# Patient Record
Sex: Female | Born: 1935
Health system: Southern US, Community
[De-identification: ages and names within clinical notes are randomized; demographics above are authoritative.]

## PROBLEM LIST (undated history)

## (undated) DIAGNOSIS — I10 Essential (primary) hypertension: Secondary | ICD-10-CM

## (undated) DIAGNOSIS — F32A Depression, unspecified: Secondary | ICD-10-CM

## (undated) DIAGNOSIS — F329 Major depressive disorder, single episode, unspecified: Secondary | ICD-10-CM

## (undated) DIAGNOSIS — F419 Anxiety disorder, unspecified: Secondary | ICD-10-CM

## (undated) DIAGNOSIS — K5792 Diverticulitis of intestine, part unspecified, without perforation or abscess without bleeding: Secondary | ICD-10-CM

## (undated) DIAGNOSIS — M1990 Unspecified osteoarthritis, unspecified site: Secondary | ICD-10-CM

## (undated) DIAGNOSIS — C801 Malignant (primary) neoplasm, unspecified: Secondary | ICD-10-CM

## (undated) DIAGNOSIS — C4491 Basal cell carcinoma of skin, unspecified: Secondary | ICD-10-CM

## (undated) HISTORY — PX: EYE SURGERY: SHX253

## (undated) HISTORY — PX: ABDOMINAL HYSTERECTOMY: SHX81

## (undated) HISTORY — PX: OTHER SURGICAL HISTORY: SHX169

## (undated) HISTORY — PX: WISDOM TOOTH EXTRACTION: SHX21

## (undated) HISTORY — PX: APPENDECTOMY: SHX54

## (undated) HISTORY — PX: ABDOMINAL SURGERY: SHX537

## (undated) HISTORY — PX: DILATION AND CURETTAGE OF UTERUS: SHX78

---
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
## 2004-01-12 HISTORY — PX: OTHER SURGICAL HISTORY: SHX169

## 2009-11-17 ENCOUNTER — Encounter: Payer: Self-pay | Admitting: Family Medicine

## 2010-01-15 ENCOUNTER — Ambulatory Visit
Admission: RE | Admit: 2010-01-15 | Discharge: 2010-01-15 | Payer: Self-pay | Source: Home / Self Care | Attending: Family Medicine | Admitting: Family Medicine

## 2010-01-15 ENCOUNTER — Encounter: Payer: Self-pay | Admitting: Family Medicine

## 2010-01-15 ENCOUNTER — Other Ambulatory Visit: Payer: Self-pay | Admitting: Family Medicine

## 2010-01-15 DIAGNOSIS — Z8719 Personal history of other diseases of the digestive system: Secondary | ICD-10-CM | POA: Insufficient documentation

## 2010-01-15 DIAGNOSIS — M129 Arthropathy, unspecified: Secondary | ICD-10-CM | POA: Insufficient documentation

## 2010-01-15 DIAGNOSIS — I10 Essential (primary) hypertension: Secondary | ICD-10-CM | POA: Insufficient documentation

## 2010-01-15 LAB — CBC WITH DIFFERENTIAL/PLATELET
Basophils Absolute: 0 10*3/uL (ref 0.0–0.1)
Basophils Relative: 0.4 % (ref 0.0–3.0)
Eosinophils Absolute: 0 10*3/uL (ref 0.0–0.7)
Eosinophils Relative: 0.5 % (ref 0.0–5.0)
HCT: 41.4 % (ref 36.0–46.0)
Hemoglobin: 14 g/dL (ref 12.0–15.0)
Lymphocytes Relative: 15.8 % (ref 12.0–46.0)
Lymphs Abs: 1.5 10*3/uL (ref 0.7–4.0)
MCHC: 33.9 g/dL (ref 30.0–36.0)
MCV: 92.6 fl (ref 78.0–100.0)
Monocytes Absolute: 0.8 10*3/uL (ref 0.1–1.0)
Monocytes Relative: 8.2 % (ref 3.0–12.0)
Neutro Abs: 7 10*3/uL (ref 1.4–7.7)
Neutrophils Relative %: 75.1 % (ref 43.0–77.0)
Platelets: 209 10*3/uL (ref 150.0–400.0)
RBC: 4.47 Mil/uL (ref 3.87–5.11)
RDW: 13.9 % (ref 11.5–14.6)
WBC: 9.3 10*3/uL (ref 4.5–10.5)

## 2010-01-16 LAB — BASIC METABOLIC PANEL
BUN: 12 mg/dL (ref 6–23)
CO2: 30 mEq/L (ref 19–32)
Calcium: 9.3 mg/dL (ref 8.4–10.5)
Chloride: 98 mEq/L (ref 96–112)
Creatinine, Ser: 0.8 mg/dL (ref 0.4–1.2)
GFR: 68.24 mL/min (ref >60.00)
Glucose, Bld: 94 mg/dL (ref 70–99)
Potassium: 4.9 mEq/L (ref 3.5–5.1)
Sodium: 135 mEq/L (ref 135–145)

## 2010-01-16 LAB — HEPATIC FUNCTION PANEL
ALT: 15 U/L (ref 0–35)
AST: 20 U/L (ref 0–37)
Albumin: 3.9 g/dL (ref 3.5–5.2)
Alkaline Phosphatase: 70 U/L (ref 39–117)
Bilirubin, Direct: 0.1 mg/dL (ref 0.0–0.3)
Total Bilirubin: 0.6 mg/dL (ref 0.3–1.2)
Total Protein: 6.7 g/dL (ref 6.0–8.3)

## 2010-01-16 LAB — TSH: TSH: 3.25 u[IU]/mL (ref 0.35–5.50)

## 2010-02-12 NOTE — Miscellaneous (Signed)
Summary: Health Care Power of [facility] Power of Attorney   Imported By: Maryln Gottron 01/20/2010 10:52:44  _____________________________________________________________________  External Attachment:    Type:   Image     Comment:   External Document

## 2010-02-12 NOTE — Miscellaneous (Signed)
Summary: Living Will  Living Will   Imported By: Maryln Gottron 01/20/2010 10:49:16  _____________________________________________________________________  External Attachment:    Type:   Image     Comment:   External Document

## 2010-02-12 NOTE — Assessment & Plan Note (Signed)
Summary: PT EST APPT (MEDICARE PT) - OK PER DR TODD // RS   Vital Signs:  Patient profile:   77 year old female Height:      63 inches Weight:      150 pounds BMI:     26.67 Temp:     97.5 degrees F oral BP sitting:   140 / 90  (left arm) Cuff size:   regular  Vitals Entered By: Kern Reap CMA Duncan Dull) (January 15, 2010 9:36 AM) CC: new to establish care Is Patient Diabetic? No Pain Assessment Patient in pain? no        CC:  new to establish care.  History of Present Illness: [name] is a delightful, 77 year old recently widowed......... her husband died in 09/23/11after a 3-year stay in a nursing home with dementia......... who comes in today accompanied by her daughter, who she currently lives with for general medical examination as a new patient.  Her past medical history, social history, family history were all reviewed in detail.  Allergies ACE inhibitor gives her a cough.  No hives.  Current medications Celexa 20 mg nightly also, Ambien 5 mg nightly.  She's been on the Ambien, now for 3 years.  We discussed coming off the Ambien.  Eight atenolol 50 mg daily, along with hydrochlorothiazide 25 mg daily for hypertension.  BP 140/90.  Prilosec 20 p.r.n.  She is having trouble with constipation.  History diverticuli also a polyp.  Colonoscopy in 06, normal.  Also, some osteoarthritis of her hands.  Last mammogram was 06.  Advise she does not need further mammography nor colonoscopy screening tests.  Tetanus 2008, Pneumovax 2008, seasonal flu 2011, shingles 2008.  She also states she takes a baby aspirin daily, along with one tablespoon of Metamucil because of the diverticulosis  Preventive Screening-Counseling & Management  Alcohol-Tobacco     Smoking Status: never  Hep-HIV-STD-Contraception     Dental Visit-last 6 months yes      Drug Use:  no.    Allergies (verified): No Known Drug Allergies  Past History:  Past medical, surgical, family and social  histories (including risk factors) reviewed, and no changes noted (except as noted below).  Past Medical History: Diverticulitis, hx of Hypertension  Past Surgical History: uterin tumor  Family History: Reviewed history and no changes required. Father:  Mother: DM II Siblings: 3 brothers - deceased, 4 sisters - 2 deceased  Social History: Reviewed history and no changes required. Retired Conservation officer, [employer] Never Smoked Alcohol use-no Drug use-no Smoking Status:  never Drug Use:  no Dental Care w/in 6 mos.:  yes  Review of Systems      See HPI  Physical Exam  General:  Well-developed,well-nourished,in no acute distress; alert,appropriate and cooperative throughout examination Head:  Normocephalic and atraumatic without obvious abnormalities. No apparent alopecia or balding. Eyes:  No corneal or conjunctival inflammation noted. EOMI. Perrla. Funduscopic exam benign, without hemorrhages, exudates or papilledema. Vision grossly normal.....bilateral cataract and lens implants.  Last year Ears:  External ear exam shows no significant lesions or deformities.  Otoscopic examination reveals clear canals, tympanic membranes are intact bilaterally without bulging, retraction, inflammation or discharge. Hearing is grossly normal bilaterally. Nose:  External nasal examination shows no deformity or inflammation. Nasal mucosa are pink and moist without lesions or exudates. Mouth:  Oral mucosa and oropharynx without lesions or exudates.  Teeth in good repair. Neck:  No deformities, masses, or tenderness noted. Chest Wall:  No deformities, masses, or tenderness noted. Breasts:  No mass, nodules, thickening, tenderness, bulging, retraction, inflamation, nipple discharge or skin changes noted.   Lungs:  Normal respiratory effort, chest expands symmetrically. Lungs are clear to auscultation, no crackles or wheezes. Heart:  Normal rate and regular rhythm. S1 and S2 normal without gallop, murmur,  click, rub or other extra sounds. Abdomen:  Bowel sounds positive,abdomen soft and non-tender without masses, organomegaly or hernias noted. Rectal:  No external abnormalities noted. Normal sphincter tone. No rectal masses or tenderness. Msk:  No deformity or scoliosis noted of thoracic or lumbar spine.   Pulses:  R and L carotid,radial,femoral,dorsalis pedis and posterior tibial pulses are full and equal bilaterally Extremities:  No clubbing, cyanosis, edema, or deformity noted with normal full range of motion of all joints.   Neurologic:  No cranial nerve deficits noted. Station and gait are normal. Plantar reflexes are down-going bilaterally. DTRs are symmetrical throughout. Sensory, motor and coordinative functions appear intact. Skin:  total body skin exam normal.  She has a garden variety of seborrheic keratosis.  Skin tags, nothing that appears atypical Cervical Nodes:  No lymphadenopathy noted Axillary Nodes:  No palpable lymphadenopathy Inguinal Nodes:  No significant adenopathy Psych:  Cognition and judgment appear intact. Alert and cooperative with normal attention span and concentration. No apparent delusions, illusions, hallucinations   Problems:  Medical Problems Added: 1)  Dx of Arthritis  (ICD-716.90) 2)  Dx of Hypertension  (ICD-401.9) 3)  Dx of Diverticulitis, Hx of  (ICD-V12.79)  Impression & Recommendations:  Problem # 1:  HYPERTENSION (ICD-401.9) Assessment New  Her updated medication list for this problem includes:    Atenolol 50 Mg Tabs (Atenolol) .Marland Kitchen... Take one tab once daily    Hydrochlorothiazide 25 Mg Tabs (Hydrochlorothiazide) .Marland Kitchen... Take one tab by mouth once daily  Orders: Venipuncture (16109) TLB-BMP (Basic Metabolic Panel-BMET) (80048-METABOL) TLB-CBC Platelet - w/Differential (85025-CBCD) TLB-Hepatic/Liver Function Pnl (80076-HEPATIC) TLB-TSH (Thyroid Stimulating Hormone) (84443-TSH) EKG w/ Interpretation (93000)  Problem # 2:  DIVERTICULITIS, HX  OF (ICD-V12.79) Assessment: New  Orders: Venipuncture (60454) TLB-BMP (Basic Metabolic Panel-BMET) (80048-METABOL) TLB-CBC Platelet - w/Differential (85025-CBCD) TLB-Hepatic/Liver Function Pnl (80076-HEPATIC) TLB-TSH (Thyroid Stimulating Hormone) (84443-TSH)  Problem # 3:  ARTHRITIS (ICD-716.90) Assessment: New  Orders: Venipuncture (09811) TLB-BMP (Basic Metabolic Panel-BMET) (80048-METABOL) TLB-CBC Platelet - w/Differential (85025-CBCD) TLB-Hepatic/Liver Function Pnl (80076-HEPATIC) TLB-TSH (Thyroid Stimulating Hormone) (84443-TSH) EKG w/ Interpretation (93000)  Complete Medication List: 1)  Citalopram Hydrobromide 20 Mg Tabs (Citalopram hydrobromide) .Marland Kitchen.. 1 & 1/2 at bedtime 2)  Atenolol 50 Mg Tabs (Atenolol) .... Take one tab once daily 3)  Zolpidem Tartrate 5 Mg Tabs (Zolpidem tartrate) .... Take one tab by mouth at bedtime 4)  Hydrochlorothiazide 25 Mg Tabs (Hydrochlorothiazide) .... Take one tab by mouth once daily 5)  Omeprazole 20 Mg Cpdr (Omeprazole) .... Take one tab once daily as needed 6)  Ativan 0.5 Mg Tabs (Lorazepam) .Marland Kitchen.. 1 tab @ bedtime  Other Orders: Urinalysis-dipstick only (Medicare patient) (91478GN)  Patient Instructions: 1)  begin a walking program 30 minutes daily. 2)  Return annually for general medical checkup. 3)  Increase the Metamucil to two tablespoons daily if in two weeks if bowel movements are soft and loose and regular, then continue two tablespoons.  If, however, there are not increased the dose to 3 tablespoons.  Consider increasing the Celexa and stopping the ambient. 4)  Dr. Alvester Morin is an excellent dentist. 5)  Dr. Vonna Kotyk is a 6)  ophthalmologist Prescriptions: HYDROCHLOROTHIAZIDE 25 MG TABS (HYDROCHLOROTHIAZIDE) take one tab by mouth once daily  #100  x 3   Entered and Authorized by:   Roderick Pee MD   Signed by:   Roderick Pee MD on 01/15/2010   Method used:   Print then Give to Patient   RxID:   [id number] ATENOLOL 50 MG TABS  (ATENOLOL) take one tab once daily  #100 x 3   Entered and Authorized by:   Roderick Pee MD   Signed by:   Roderick Pee MD on 01/15/2010   Method used:   Print then Give to Patient   RxID:   [id number] ATIVAN 0.5 MG TABS (LORAZEPAM) 1 tab @ bedtime  #50 x 3   Entered and Authorized by:   Roderick Pee MD   Signed by:   Roderick Pee MD on 01/15/2010   Method used:   Print then Give to Patient   RxID:   [id number] CITALOPRAM HYDROBROMIDE 20 MG TABS (CITALOPRAM HYDROBROMIDE) 1 & 1/2 at bedtime  #150 x 3   Entered and Authorized by:   Roderick Pee MD   Signed by:   Roderick Pee MD on 01/15/2010   Method used:   Print then Give to Patient   RxID:   [id number]    Orders Added: 1)  New Patient Level IV [42595] 2)  Venipuncture [63875] 3)  TLB-BMP (Basic Metabolic Panel-BMET) [80048-METABOL] 4)  TLB-CBC Platelet - w/Differential [85025-CBCD] 5)  TLB-Hepatic/Liver Function Pnl [80076-HEPATIC] 6)  TLB-TSH (Thyroid Stimulating Hormone) [84443-TSH] 7)  Urinalysis-dipstick only (Medicare patient) [81003QW] 8)  EKG w/ Interpretation [93000]   Immunization History:  Tetanus/Td Immunization History:    Tetanus/Td:  historical (01/11/2006)  Influenza Immunization History:    Influenza:  historical (10/11/2009)  Pneumovax Immunization History:    Pneumovax:  historical (01/11/2006)  Zostavax History:    Zostavax # 1:  zostavax (01/11/2006)   Immunization History:  Tetanus/Td Immunization History:    Tetanus/Td:  Historical (01/11/2006)  Influenza Immunization History:    Influenza:  Historical (10/11/2009)  Pneumovax Immunization History:    Pneumovax:  Historical (01/11/2006)  Zostavax History:    Zostavax # 1:  Zostavax (01/11/2006)   Appended Document: PT EST APPT (MEDICARE PT) - OK PER DR TODD // RS  Laboratory Results   Urine Tests    Routine Urinalysis   Color: yellow Appearance: Clear Glucose: negative   (Normal Range:  Negative) Bilirubin: negative   (Normal Range: Negative) Ketone: negative   (Normal Range: Negative) Spec. Gravity: 1.015   (Normal Range: 1.003-1.035) Blood: trace-lysed   (Normal Range: Negative) pH: 7.5   (Normal Range: 5.0-8.0) Protein: negative   (Normal Range: Negative) Urobilinogen: 0.2   (Normal Range: 0-1) Nitrite: negative   (Normal Range: Negative) Leukocyte Esterace: 2+   (Normal Range: Negative)    Comments: Rita Ohara  January 15, 2010 3:51 PM

## 2010-04-13 ENCOUNTER — Other Ambulatory Visit: Payer: Self-pay | Admitting: *Deleted

## 2010-04-13 MED ORDER — HYDROCHLOROTHIAZIDE 25 MG PO TABS: 25.0000 mg | ORAL_TABLET | Freq: Every day | ORAL | Status: DC

## 2010-04-13 MED ORDER — CITALOPRAM HYDROBROMIDE 20 MG PO TABS: ORAL_TABLET | ORAL | Status: DC

## 2010-04-13 MED ORDER — ATENOLOL 50 MG PO TABS: 50.0000 mg | ORAL_TABLET | Freq: Every day | ORAL | Status: DC

## 2010-04-13 MED ORDER — LORAZEPAM 0.5 MG PO TABS: 0.5000 mg | ORAL_TABLET | Freq: Three times a day (TID) | ORAL | Status: AC

## 2010-05-13 ENCOUNTER — Ambulatory Visit: Payer: Self-pay | Admitting: Family Medicine

## 2010-09-08 ENCOUNTER — Other Ambulatory Visit: Payer: Self-pay | Admitting: *Deleted

## 2010-09-08 MED ORDER — LORAZEPAM 0.5 MG PO TABS: 0.5000 mg | ORAL_TABLET | Freq: Three times a day (TID) | ORAL | Status: AC

## 2011-04-07 ENCOUNTER — Ambulatory Visit (INDEPENDENT_AMBULATORY_CARE_PROVIDER_SITE_OTHER)
Admission: RE | Admit: 2011-04-07 | Discharge: 2011-04-07 | Disposition: A | Discharge status: Home / Self Care | Payer: Medicare Other | Source: Ambulatory Visit | Attending: Internal Medicine | Admitting: Internal Medicine

## 2011-04-07 ENCOUNTER — Other Ambulatory Visit: Payer: Self-pay | Admitting: Cardiology

## 2011-04-07 ENCOUNTER — Telehealth: Payer: Self-pay | Admitting: *Deleted

## 2011-04-07 ENCOUNTER — Ambulatory Visit (INDEPENDENT_AMBULATORY_CARE_PROVIDER_SITE_OTHER): Payer: Medicare Other | Admitting: Internal Medicine

## 2011-04-07 ENCOUNTER — Encounter (INDEPENDENT_AMBULATORY_CARE_PROVIDER_SITE_OTHER): Payer: Medicare Other

## 2011-04-07 ENCOUNTER — Encounter: Payer: Self-pay | Admitting: Internal Medicine

## 2011-04-07 VITALS — BP 132/94 | HR 72 | Temp 97.7°F | Wt 168.0 lb

## 2011-04-07 DIAGNOSIS — M79609 Pain in unspecified limb: Secondary | ICD-10-CM | POA: Diagnosis not present

## 2011-04-07 DIAGNOSIS — M79669 Pain in unspecified lower leg: Secondary | ICD-10-CM | POA: Insufficient documentation

## 2011-04-07 DIAGNOSIS — R229 Localized swelling, mass and lump, unspecified: Secondary | ICD-10-CM

## 2011-04-07 DIAGNOSIS — M25562 Pain in left knee: Secondary | ICD-10-CM

## 2011-04-07 DIAGNOSIS — M25569 Pain in unspecified knee: Secondary | ICD-10-CM

## 2011-04-07 DIAGNOSIS — M25869 Other specified joint disorders, unspecified knee: Secondary | ICD-10-CM | POA: Diagnosis not present

## 2011-04-07 MED ORDER — TRAMADOL HCL 50 MG PO TABS: 25.0000 mg | ORAL_TABLET | Freq: Three times a day (TID) | ORAL | Status: AC | PRN

## 2011-04-07 NOTE — Assessment & Plan Note (Signed)
Question rupture of bakers cyst.  Rule out DVT.  Obtain venous ultrasound.

## 2011-04-07 NOTE — Patient Instructions (Addendum)
Our office will contact you re: orthopedic referral Use extra strength tylenol as directed

## 2011-04-07 NOTE — Assessment & Plan Note (Signed)
78 year old with intermittent left knee pain. She has medial joint line tenderness. Question osteoarthritis versus meniscal injury. Obtain x-ray of left knee and refer to orthopedic specialist for further evaluation and treatment.

## 2011-04-07 NOTE — Telephone Encounter (Signed)
L knee pain when puts weight on it, and some calf pain x one-two weeks.  No injury.  Would like to be worked in with Dr. Tawanna Cooler.  Minimal amount of swelling in calf.

## 2011-04-07 NOTE — Progress Notes (Signed)
  Subjective:    Patient ID: [name], female    DOB: 19-May-1921, 78 y.o.   MRN: [id number]  HPI  78 year old white female complains of left knee pain for one month. Her symptoms started after she flexed her knee while in bed to get underneath her covers. She initially felt a sharp pain in the back of her knee. Her pain progressed where she has knee pain along medial joint line and also near her kneecap. She notes mild left ankle swelling. There has been mild improvement with taking over-the-counter NSAIDs. Patient reports a sharp stabbing sensation when she first stands after prolonged sitting.   Posterior upper calf is tender.   Review of Systems Negative for leg redness, negative for chest pain or shortness of breath  No past medical history on file.  History   Social History  . Marital Status: Widowed    Spouse Name: N/A    Number of Children: N/A  . Years of Education: N/A   Occupational History  . Not on file.   Social History Main Topics  . Smoking status: Never Smoker   . Smokeless tobacco: Not on file  . Alcohol Use: No  . Drug Use: No  . Sexually Active: Not on file   Other Topics Concern  . Not on file   Social History Narrative   She is from [place].  Moved to [place] to be nearer to daughter [name]    No past surgical history on file.  No family history on file.  Allergies not on file  Current Outpatient Prescriptions on File Prior to Visit  Medication Sig Dispense Refill  . atenolol (TENORMIN) 50 MG tablet Take 1 tablet (50 mg total) by mouth daily.  90 tablet  3  . citalopram (CELEXA) 20 MG tablet One and half tab daily  150 tablet  3  . hydrochlorothiazide 25 MG tablet Take 1 tablet (25 mg total) by mouth daily.  90 tablet  3    BP 132/94  Pulse 72  Temp(Src) 97.7 F (36.5 C) (Oral)  Wt 168 lb (76.204 kg)       Objective:   Physical Exam  Constitutional: She appears well-developed and well-nourished.  Cardiovascular: Normal  rate, regular rhythm and normal heart sounds.   Pulmonary/Chest: Effort normal and breath sounds normal. She has no wheezes. She has no rales.  Musculoskeletal:       No obvious joint effusion. Left medial joint line tenderness,  Upper calf area tender to touch.  No redness Knee joint is stable  Skin: Skin is warm and dry.       Assessment & Plan:

## 2011-04-07 NOTE — Telephone Encounter (Signed)
Scheduled with Dr. Artist Pais today.

## 2011-04-20 DIAGNOSIS — N950 Postmenopausal bleeding: Secondary | ICD-10-CM | POA: Diagnosis not present

## 2011-04-23 ENCOUNTER — Encounter (HOSPITAL_COMMUNITY): Payer: Self-pay | Admitting: Pharmacist

## 2011-04-23 ENCOUNTER — Other Ambulatory Visit: Payer: Self-pay | Admitting: Obstetrics and Gynecology

## 2011-04-26 ENCOUNTER — Other Ambulatory Visit: Payer: Self-pay

## 2011-04-26 ENCOUNTER — Encounter (HOSPITAL_COMMUNITY): Payer: Self-pay

## 2011-04-26 ENCOUNTER — Encounter (HOSPITAL_COMMUNITY)
Admission: RE | Admit: 2011-04-26 | Discharge: 2011-04-26 | Disposition: A | Discharge status: Home / Self Care | Payer: Medicare Other | Source: Ambulatory Visit | Attending: Obstetrics and Gynecology | Admitting: Obstetrics and Gynecology

## 2011-04-26 DIAGNOSIS — Z01812 Encounter for preprocedural laboratory examination: Secondary | ICD-10-CM | POA: Diagnosis not present

## 2011-04-26 DIAGNOSIS — I10 Essential (primary) hypertension: Secondary | ICD-10-CM | POA: Diagnosis not present

## 2011-04-26 DIAGNOSIS — N950 Postmenopausal bleeding: Secondary | ICD-10-CM | POA: Diagnosis not present

## 2011-04-26 DIAGNOSIS — Z01818 Encounter for other preprocedural examination: Secondary | ICD-10-CM | POA: Diagnosis not present

## 2011-04-26 HISTORY — DX: Major depressive disorder, single episode, unspecified: F32.9

## 2011-04-26 HISTORY — DX: Anxiety disorder, unspecified: F41.9

## 2011-04-26 HISTORY — DX: Unspecified osteoarthritis, unspecified site: M19.90

## 2011-04-26 HISTORY — DX: Diverticulitis of intestine, part unspecified, without perforation or abscess without bleeding: K57.92

## 2011-04-26 HISTORY — DX: Essential (primary) hypertension: I10

## 2011-04-26 HISTORY — DX: Depression, unspecified: F32.A

## 2011-04-26 LAB — BASIC METABOLIC PANEL
BUN: 12 mg/dL (ref 6–23)
CO2: 25 mEq/L (ref 19–32)
Calcium: 9.3 mg/dL (ref 8.4–10.5)
Creatinine, Ser: 0.71 mg/dL (ref 0.50–1.10)
Glucose, Bld: 91 mg/dL (ref 70–99)

## 2011-04-26 LAB — CBC
MCH: 29.2 pg (ref 26.0–34.0)
MCHC: 32.2 g/dL (ref 30.0–36.0)
MCV: 90.7 fL (ref 78.0–100.0)
Platelets: 242 10*3/uL (ref 150–400)
RDW: 14.1 % (ref 11.5–15.5)

## 2011-04-26 NOTE — Patient Instructions (Addendum)
   Your procedure is scheduled on: Wednesday, April 17th   Enter through the [employer] of [facility] at: 7:00am Pick up the phone at the desk and dial [phone] and inform us of your arrival.  Please call this number if you have any problems the morning of surgery: [phone]  Remember: Do not eat food after midnight: Tuesday Do not drink clear liquids after: Tuesday Take these medicines the morning of surgery with a SIP OF WATER: atenolol, HCTZ  Do not wear jewelry, make-up, or FINGER nail polish Do not wear lotions, powders, perfumes or deodorant. Do not shave 48 hours prior to surgery. Do not bring valuables to the hospital. Contacts, dentures or bridgework may not be worn into surgery.  Patients discharged on the day of surgery will not be allowed to drive home.  Home with daughter Lavell Anchors  cell [phone]   Remember to use your hibiclens as instructed.Please shower with 1/2 bottle the evening before your surgery and the other 1/2 bottle the morning of surgery. Neck down avoiding private area.

## 2011-04-27 DIAGNOSIS — M25569 Pain in unspecified knee: Secondary | ICD-10-CM | POA: Diagnosis not present

## 2011-04-27 NOTE — H&P (Signed)
[name] is an 78 y.o. female. Who has developed post menopausal bleeding initially evaluated in May 2012 with an endometrial biopsy which revealed atrophic endometrium. The pathology report suggested an infarcted polyp but otherwise unremarkable. A prior pap smear however also noted atypical endometrial cells. She now has had renewed bleeding and is being taken to the operating room to undergo D&C and hysteroscopy to identify the source of bleeding and to rule out endometrial neoplasia   Pertinent Gynecological History:  Previous GYN Procedures: endometrial biopsy and surgery for fibroid tumor Last pap: normal Date: 04/20/2011  OB History: G1, P1    Past Medical History  Diagnosis Date  . Hypertension   . Diverticulitis   . Depression   . Arthritis     Hands/knees/hip/lower back - no meds  . Anxiety     Past Surgical History  Procedure Date  . Colonscopy 2006  . Wisdom tooth extraction   . Svd     x 1  . Eye surgery     Bilateral cataract eye surgery  . Abdominal surgery     removed fibroid and one fallopian tube removed  . Appendectomy   . Dilation and curettage of uterus     No family history on file.  Social History:  reports that she has never smoked. She has never used smokeless tobacco. She reports that she does not drink alcohol or use illicit drugs.  Allergies:  Allergies  Allergen Reactions  . Ace Inhibitors Cough    No prescriptions prior to admission    ROS  Respiratory: denies cough or SOB GI: no nausea, vomiting or diarrhea, no melena GU: no dysuria, frequency or urgency Gyn: positve bleeding but she denies pain or discharge   There were no vitals taken for this visit. Physical Exam  Afebrile      BP 143/83   Pulse 60   Respirations 16 Head: normocephalic and atraumatic Eyes: PERRLA Neck: supple no JVD no enlargement of thyroid Chest: clear to P&A Heart Regular rhythm Abdomen: soft without enlargement of liver kidneys or spleen Pelvic  exam:  External genitalia: NL                        BUS: WNL                         Vagina without gross lesion with normal discharge and positve for blood                        Cervix: blood per os bt without lesion                          Uterus: anterior normal size non tender                         Adnexa: without mass non tender Neurologic: grossly intact  No results found for this or any previous visit (from the past 24 hour(s)).  No results found.  Assessment/Plan:  Post menopausal bleeding etiology unknown   Plan: D&C and hysteroscopy Faye Sanfilippo 04/27/2011, 6:03 PM

## 2011-04-28 ENCOUNTER — Encounter (HOSPITAL_COMMUNITY): Payer: Self-pay | Admitting: Anesthesiology

## 2011-04-28 ENCOUNTER — Encounter (HOSPITAL_COMMUNITY)
Admission: RE | Disposition: A | Discharge status: Home / Self Care | Payer: Self-pay | Source: Ambulatory Visit | Attending: Obstetrics and Gynecology

## 2011-04-28 ENCOUNTER — Ambulatory Visit (HOSPITAL_COMMUNITY)
Admission: RE | Admit: 2011-04-28 | Discharge: 2011-04-28 | Disposition: A | Discharge status: Home / Self Care | Payer: Medicare Other | Source: Ambulatory Visit | Attending: Obstetrics and Gynecology | Admitting: Obstetrics and Gynecology

## 2011-04-28 ENCOUNTER — Ambulatory Visit (HOSPITAL_COMMUNITY): Payer: Medicare Other | Admitting: Anesthesiology

## 2011-04-28 DIAGNOSIS — Z01812 Encounter for preprocedural laboratory examination: Secondary | ICD-10-CM | POA: Insufficient documentation

## 2011-04-28 DIAGNOSIS — I10 Essential (primary) hypertension: Secondary | ICD-10-CM | POA: Diagnosis not present

## 2011-04-28 DIAGNOSIS — Z01818 Encounter for other preprocedural examination: Secondary | ICD-10-CM | POA: Diagnosis not present

## 2011-04-28 DIAGNOSIS — N950 Postmenopausal bleeding: Secondary | ICD-10-CM | POA: Insufficient documentation

## 2011-04-28 DIAGNOSIS — C549 Malignant neoplasm of corpus uteri, unspecified: Secondary | ICD-10-CM | POA: Diagnosis not present

## 2011-04-28 HISTORY — PX: HYSTEROSCOPY WITH D & C: SHX1775

## 2011-04-28 SURGERY — DILATATION AND CURETTAGE /HYSTEROSCOPY
Anesthesia: Monitor Anesthesia Care | Site: Vagina | Wound class: Clean Contaminated

## 2011-04-28 MED ORDER — MIDAZOLAM HCL 2 MG/2ML IJ SOLN
INTRAMUSCULAR | Status: AC
  Filled 2011-04-28: qty 2

## 2011-04-28 MED ORDER — HYDROMORPHONE HCL PF 1 MG/ML IJ SOLN: 0.2500 mg | INTRAMUSCULAR | Status: DC | PRN

## 2011-04-28 MED ORDER — LIDOCAINE HCL (CARDIAC) 20 MG/ML IV SOLN
INTRAVENOUS | Status: DC | PRN
  Administered 2011-04-28: 60 mg via INTRAVENOUS

## 2011-04-28 MED ORDER — PROPOFOL 10 MG/ML IV EMUL
INTRAVENOUS | Status: DC | PRN
  Administered 2011-04-28: 200 ug/kg/min via INTRAVENOUS
  Administered 2011-04-28: 09:00:00 via INTRAVENOUS

## 2011-04-28 MED ORDER — FENTANYL CITRATE 0.05 MG/ML IJ SOLN
INTRAMUSCULAR | Status: AC
  Filled 2011-04-28: qty 2

## 2011-04-28 MED ORDER — GLYCINE 1.5 % IR SOLN
Status: DC | PRN
  Administered 2011-04-28: 3000 mL

## 2011-04-28 MED ORDER — PROMETHAZINE HCL 25 MG/ML IJ SOLN: 6.2500 mg | INTRAMUSCULAR | Status: DC | PRN

## 2011-04-28 MED ORDER — MIDAZOLAM HCL 5 MG/5ML IJ SOLN
INTRAMUSCULAR | Status: DC | PRN
  Administered 2011-04-28: 2 mg via INTRAVENOUS

## 2011-04-28 MED ORDER — LACTATED RINGERS IV SOLN
INTRAVENOUS | Status: DC
  Administered 2011-04-28 (×2): via INTRAVENOUS

## 2011-04-28 MED ORDER — LIDOCAINE HCL 1 % IJ SOLN
INTRAMUSCULAR | Status: DC | PRN
  Administered 2011-04-28: 10 mL

## 2011-04-28 MED ORDER — KETOROLAC TROMETHAMINE 30 MG/ML IJ SOLN: 15.0000 mg | Freq: Once | INTRAMUSCULAR | Status: DC | PRN

## 2011-04-28 MED ORDER — MEPERIDINE HCL 25 MG/ML IJ SOLN: 6.2500 mg | INTRAMUSCULAR | Status: DC | PRN

## 2011-04-28 SURGICAL SUPPLY — 14 items
ABLATOR ENDOMETRIAL BIPOLAR (ABLATOR) IMPLANT
CANISTER SUCTION 2500CC (MISCELLANEOUS) ×2 IMPLANT
CATH ROBINSON RED A/P 16FR (CATHETERS) ×2 IMPLANT
CLOTH BEACON ORANGE TIMEOUT ST (SAFETY) ×2 IMPLANT
CONTAINER PREFILL 10% NBF 60ML (FORM) ×4 IMPLANT
ELECT REM PT RETURN 9FT ADLT (ELECTROSURGICAL)
ELECTRODE REM PT RTRN 9FT ADLT (ELECTROSURGICAL) IMPLANT
GLOVE BIO SURGEON STRL SZ7.5 (GLOVE) ×4 IMPLANT
GOWN PREVENTION PLUS LG XLONG (DISPOSABLE) ×2 IMPLANT
GOWN STRL REIN XL XLG (GOWN DISPOSABLE) ×2 IMPLANT
LOOP ANGLED CUTTING 22FR (CUTTING LOOP) IMPLANT
PACK HYSTEROSCOPY LF (CUSTOM PROCEDURE TRAY) ×2 IMPLANT
TOWEL OR 17X24 6PK STRL BLUE (TOWEL DISPOSABLE) ×4 IMPLANT
WATER STERILE IRR 1000ML POUR (IV SOLUTION) ×2 IMPLANT

## 2011-04-28 NOTE — Anesthesia Procedure Notes (Signed)
Procedure Name: MAC Date/Time: 04/28/2011 8:30 AM Performed by: Shamel Germond, Jannet Askew Pre-anesthesia Checklist: Patient identified, Patient being monitored, Emergency Drugs available, Timeout performed and Suction available Preoxygenation: Pre-oxygenation with 100% oxygen

## 2011-04-28 NOTE — Brief Op Note (Signed)
04/28/2011  8:45 AM  PATIENT:  Sundra Aland  78 y.o. female  PRE-OPERATIVE DIAGNOSIS:  POSTMENOPAUSAL BLEEDING  POST-OPERATIVE DIAGNOSIS:  postmenopausal bleeding  PROCEDURE:  Procedure(s) (LRB): DILATATION AND CURETTAGE /HYSTEROSCOPY (N/A)  SURGEON:  Surgeon(s) and Role:    * Miguel Aschoff, MD - Primary   ANESTHESIA:   IV sedation  EBL:  Total I/O In: 500 [I.V.:500] Out: -   BLOOD ADMINISTERED:none  DRAINS: none   LOCAL MEDICATIONS USED:  LIDOCAINE   SPECIMEN:  Source of Specimen:  Endometrial currettings  DISPOSITION OF SPECIMEN:  PATHOLOGY  COUNTS:  YES  TOURNIQUET:  * No tourniquets in log *  DICTATION: .Other Dictation: Dictation Number [id number]  PLAN OF CARE: Discharge to home after PACU  PATIENT DISPOSITION:  PACU - hemodynamically stable.   Delay start of Pharmacological VTE agent (>24hrs) due to surgical blood loss or risk of bleeding: not applicable

## 2011-04-28 NOTE — Op Note (Signed)
NAMEKRYSTALL, [name] NO.:  [id number]  MEDICAL RECORD NO.:  [id number]  LOCATION:  [facility]                          FACILITY:  [facility]  PHYSICIAN:  Miguel Aschoff, M.D.       DATE OF BIRTH:  01/08/1922  DATE OF PROCEDURE:  04/28/2011 DATE OF DISCHARGE:                              OPERATIVE REPORT   PREOPERATIVE DIAGNOSIS:  Postmenopausal bleeding.  POSTOPERATIVE DIAGNOSIS:  Postmenopausal bleeding, rule out endometrial neoplasia.  SURGEON:  Miguel Aschoff, MD  ANESTHESIA:  IV sedation with paracervical block.  COMPLICATIONS:  None.  JUSTIFICATION:  The patient is an 78 year old white female, who developed vaginal bleeding and was initially assessed in May 2012, at which time she had an endometrial biopsy revealing atrophic-appearing endometrium.  The patient initially did okay, however, presented in April 2013 with renewed bleeding.  In view of the degree of bleeding, it was felt that a thorough examination of the endometrial cavity needed to be carried out to rule out endometrial neoplasia.  The risks and benefits of the procedure were discussed with the patient and her daughter.  Informed consent has been obtained for hysteroscopy, and D and C.  The risks and benefits of the procedure were discussed with the patient and her daughter.  Informed consent has been obtained.  DESCRIPTION OF PROCEDURE:  The patient was taken to the operating room, placed in a supine position.  General and IV sedation was administered without difficulty.  She was then placed in a dorsal lithotomy position, prepped and draped in a usual sterile fashion.  Foley catheter was placed and bladder drained.  Examination under anesthesia revealed normal external genitalia.  Normal Bartholin and Skene glands.  Normal urethra.  The vaginal vault was without gross lesion except for atrophic vaginal changes.  The cervix revealed no obvious lesions.  There was blood coming through the cervical os.   Uterus was noted to be anterior, globular, top-normal size.  No adnexal masses were noted.  At this point, speculum was placed in the vaginal vault.  The anterior cervical lip was grasped with a tenaculum and then the cervix was dilated until a #25 Pratt dilator could be passed.  Then, the diagnostic hysteroscope was advanced through the endocervical canal.  No endocervical lesions were noted.  However, on entering the endometrial cavity, a large amount of tissue appeared to be present within the cavity way more than expected for an 78 year old female.  At this point, the hysteroscope was removed.  A sharp vigorous curettage was carried out yielding a large amount of tissue suspicious for an endometrial neoplasm.  This was sent for histologic study.  Prior to doing the curettage, 10 mL of 1% Xylocaine was placed in the cervix by placing equal amounts at 12 and 6 o'clock positions.  Following this, curettage was carried out without difficulty.  After the tissue was collected, it was sent for histologic study.  The patient had the instruments removed.  Hemostasis was readily achieved.  She was brought to the recovery in satisfactory condition. The patient tolerated the procedure well.  The estimated blood loss was approximately 30 mL.  The fluid deficit on hysteroscopy was 10 mL.  Plan is for the patient to be discharged home.  She is to call on Friday for her pathology report.  She is to call for any problems such as fever, pain, or heavy bleeding.     Miguel Aschoff, M.D.     AR/MEDQ  D:  04/28/2011  T:  04/28/2011  Job:  [id number]

## 2011-04-28 NOTE — Anesthesia Preprocedure Evaluation (Signed)
Anesthesia Evaluation  Patient identified by MRN, date of birth, ID band Patient awake    Reviewed: Allergy & Precautions, H&P , NPO status , Patient's Chart, lab work & pertinent test results  Airway Mallampati: II TM Distance: >3 FB Neck ROM: full    Dental  (+) Teeth Intact   Pulmonary neg pulmonary ROS,  breath sounds clear to auscultation  Pulmonary exam normal       Cardiovascular hypertension, Pt. on home beta blockers     Neuro/Psych Anxiety Depression negative neurological ROS  negative psych ROS   GI/Hepatic negative GI ROS, Neg liver ROS,   Endo/Other  negative endocrine ROS  Renal/GU negative Renal ROS  negative genitourinary   Musculoskeletal negative musculoskeletal ROS (+)   Abdominal Normal abdominal exam  (+)   Peds negative pediatric ROS (+)  Hematology negative hematology ROS (+)   Anesthesia Other Findings   Reproductive/Obstetrics negative OB ROS                           Anesthesia Physical Anesthesia Plan  ASA: II  Anesthesia Plan: MAC   Post-op Pain Management:    Induction: Intravenous  Airway Management Planned:   Additional Equipment:   Intra-op Plan:   Post-operative Plan:   Informed Consent: I have reviewed the patients History and Physical, chart, labs and discussed the procedure including the risks, benefits and alternatives for the proposed anesthesia with the patient or authorized representative who has indicated his/her understanding and acceptance.   Dental Advisory Given and History available from chart only  Plan Discussed with: CRNA and Surgeon  Anesthesia Plan Comments:         Anesthesia Quick Evaluation

## 2011-04-28 NOTE — Transfer of Care (Signed)
Immediate Anesthesia Transfer of Care Note  Patient: [name]  Procedure(s) Performed: Procedure(s) (LRB): DILATATION AND CURETTAGE /HYSTEROSCOPY (N/A)  Patient Location: PACU  Anesthesia Type: MAC  Level of Consciousness: sedated  Airway & Oxygen Therapy: Patient Spontanous Breathing and Patient connected to nasal cannula oxygen  Post-op Assessment: Report given to PACU RN and Post -op Vital signs reviewed and stable  Post vital signs: Reviewed and stable  Complications: No apparent anesthesia complications

## 2011-04-28 NOTE — Discharge Instructions (Signed)
DISCHARGE INSTRUCTIONS: D&C / D&E The following instructions have been prepared to help you care for yourself upon your return home.   Personal hygiene: Marland Kitchen Use sanitary pads for vaginal drainage, not tampons. . Shower the day after your procedure. . NO tub baths, pools or Jacuzzis for 2-3 weeks. . Wipe front to back after using the bathroom.  Activity and limitations: . Do NOT drive or operate any equipment for 24 hours. The effects of anesthesia are still present and drowsiness may result. . Do NOT rest in bed all day. . Walking is encouraged. . Walk up and down stairs slowly. . You may resume your normal activity in one to two days or as indicated by your physician.  Sexual activity: NO intercourse for at least 2 weeks after the procedure, or as indicated by your physician.  Diet: Eat a light meal as desired this evening. You may resume your usual diet tomorrow.  Return to work: You may resume your work activities in one to two days or as indicated by your doctor.  What to expect after your surgery: Expect to have vaginal bleeding/discharge for 2-3 days and spotting for up to 10 days. It is not unusual to have soreness for up to 1-2 weeks. You may have a slight burning sensation when you urinate for the first day. Mild cramps may continue for a couple of days. You may have a regular period in 2-6 weeks.  Call your doctor for any of the following: . Excessive vaginal bleeding, saturating and changing one pad every hour. . Inability to urinate 6 hours after discharge from hospital. . Pain not relieved by pain medication. . Fever of 100.4 F or greater. . Unusual vaginal discharge or odor.  Post Anesthesia Care Unit [phone]

## 2011-04-28 NOTE — Anesthesia Postprocedure Evaluation (Signed)
Anesthesia Post Note  Patient: [name]  Procedure(s) Performed: Procedure(s) (LRB): DILATATION AND CURETTAGE /HYSTEROSCOPY (N/A)  Anesthesia type: MAC  Patient location: PACU  Post pain: Pain level controlled  Post assessment: Post-op Vital signs reviewed  Last Vitals:  Filed Vitals:   04/28/11 0845  BP: 137/61  Pulse: 56  Temp: 36.4 C  Resp: 16    Post vital signs: Reviewed  Level of consciousness: sedated  Complications: No apparent anesthesia complications

## 2011-04-29 ENCOUNTER — Encounter (HOSPITAL_COMMUNITY): Payer: Self-pay | Admitting: Obstetrics and Gynecology

## 2011-05-12 ENCOUNTER — Encounter: Payer: Self-pay | Admitting: Gynecologic Oncology

## 2011-05-12 ENCOUNTER — Ambulatory Visit: Payer: Medicare Other | Attending: Gynecologic Oncology | Admitting: Gynecologic Oncology

## 2011-05-12 VITALS — BP 180/80 | HR 74 | Temp 98.0°F | Resp 14 | Ht 63.62 in | Wt 157.2 lb

## 2011-05-12 DIAGNOSIS — I10 Essential (primary) hypertension: Secondary | ICD-10-CM | POA: Insufficient documentation

## 2011-05-12 DIAGNOSIS — C549 Malignant neoplasm of corpus uteri, unspecified: Secondary | ICD-10-CM | POA: Diagnosis not present

## 2011-05-12 DIAGNOSIS — Z79899 Other long term (current) drug therapy: Secondary | ICD-10-CM | POA: Insufficient documentation

## 2011-05-12 DIAGNOSIS — C541 Malignant neoplasm of endometrium: Secondary | ICD-10-CM

## 2011-05-12 NOTE — Patient Instructions (Signed)
Return for pre-operative visit

## 2011-05-12 NOTE — Progress Notes (Signed)
Consult Note: Gyn-Onc  [name] 78 y.o. female  CC:  Chief Complaint  Patient presents with  . Endo ca    Follow up    HPI: The patient is seen today in consultation at the request of Dr. Tenny Hudson. Patient is a very pleasant 78 year old gravida 1 para 1 has been menopausal since her 71s to 14s. She took Premarin for a few years and in was prescribed progesterone had some bleeding therefore she stopped all hormone replacement therapy. This is about 20 years ago. She began having some bleeding in July of 2012 at which time she had an endometrial biopsy that revealed an atrophic endometrium. She did well until she presented in April of 2013 with any bleeding. In view of the degree of bleeding was felt that a thorough evaluation of the endometrial cavity needed to be performed. She therefore underwent a hysteroscopy D&C. Hysteroscopic findings included a large amount of tissue present within the cavity that was more than switch to be expected for an 78 year old. Pathology revealed a grade 2 endometrioid carcinoma. She comes in today for consultation regarding the above.  She is accompanied by her daughter today. She states she continues having a small amount of bleeding. She denies any abdominal or pelvic pain. She states that there is no significant change in her bowel or bladder habits. She does state that occasionally she feels about with bowel movements but has been going on for a few years. She occasionally also has some stool incontinence if she has some diarrhea. This will happen about once a week if she has diarrhea.  She had a colonoscopy 4 years ago that showed diverticular disease.  Interval History:   Review of Systems: She denies any chest pain, shortness of breath, nausea, fevers, chills. Has a headaches or visual changes. She denies any significant change about bladder habits. She denies any chest pain or shortness of breath as stated above she's able to climb a flight of stairs  without difficulty and her METs are greater than 4.  Current Meds:  Outpatient Encounter Prescriptions as of 05/12/2011  Medication Sig Dispense Refill  . aspirin 81 MG chewable tablet Chew 81 mg by mouth every morning.       [name] atenolol (TENORMIN) 50 MG tablet Take 50 mg by mouth every morning.      . calcium citrate (CALCITRATE - DOSED IN MG ELEMENTAL CALCIUM) 950 MG tablet Take 1 tablet by mouth every morning.      . carboxymethylcellulose (REFRESH TEARS) 0.5 % SOLN Apply 1 drop to eye 2 (two) times daily.      . citalopram (CELEXA) 20 MG tablet One and half tab daily  150 tablet  3  . hydrochlorothiazide (HYDRODIURIL) 25 MG tablet Take 25 mg by mouth every morning.      . psyllium (METAMUCIL) 58.6 % packet Take 1 packet by mouth every evening.         Allergy:  Allergies  Allergen Reactions  . Ace Inhibitors Cough    Social Hx:   History   Social History  . Marital Status: Widowed    Spouse Name: N/A    Number of Children: N/A  . Years of Education: N/A   Occupational History  . Not on file.   Social History Main Topics  . Smoking status: Never Smoker   . Smokeless tobacco: Never Used  . Alcohol Use: No  . Drug Use: No  . Sexually Active: No   Other Topics Concern  .  Not on file   Social History Narrative   She is from [place].  Moved to [place] to be nearer to daughter [name]    Past Surgical Hx: Myomectomy in 1967. She had a mammogram 4 years ago. Past Surgical History  Procedure Date  . Colonscopy 2006  . Wisdom tooth extraction   . Svd     x 1  . Eye surgery     Bilateral cataract eye surgery  . Abdominal surgery     removed fibroid and one fallopian tube removed  . Appendectomy   . Dilation and curettage of uterus   . Hysteroscopy w/d&c 04/28/2011    Procedure: DILATATION AND CURETTAGE /HYSTEROSCOPY;  Surgeon: Michelle Aschoff, MD;  Location: [facility];  Service: Gynecology;  Laterality: N/A;    Past Medical Hx:  Past Medical History  Diagnosis Date   . Hypertension   . Diverticulitis   . Depression   . Arthritis     Hands/knees/hip/lower back - no meds  . Anxiety     Family Hx: Her great niece had breast cancer in her 30s and pancreatic cancer in her 14s Family History  Problem Relation Age of Onset  . Breast cancer Sister   . Lung cancer Brother     Vitals:  Blood pressure 180/80, pulse 74, temperature 98 F (36.7 C), resp. rate 14, height 5' 3.62" (1.616 m), weight 157 lb 3.2 oz (71.305 kg).  Physical Exam: Well-nourished well-developed female in no acute distress.  Neck: Supple no lymphadenopathy no thyromegaly.  Lungs: Clear to auscultation bilaterally.  Cardiovascular: Regular rate and rhythm.  Abdomen: Well-healed incision is no evidence of incisional hernia. Abdomen is soft nontender nondistended no palpable masses or hepatomegaly.  Groins: No lymphadenopathy.  Extremities: No edema.  Pelvic: Normal external female genitalia. The vagina is atrophic the cervix is visualized with a steady flow. There is no gross visible lesions on the cervix. Bimanual examination the cervix is palpably normal the purposes of normal size shape and consistency. There are no adnexal masses. Rectal confirms.  Assessment/Plan: 78 year old gravida 1 para 1 with a clinical stage I FIGO grade 2 endometrioid adenocarcinoma. I discussed surgery with her and her daughter. We spent greater than 30 minutes face to face time discussing the procedure. I believe that we can attempt a robotic hysterectomy bilateral salpingo-oophorectomy pelvic and an attempted para-aortic lymph node dissection. She would very much like for this to be done in a minimally invasive fashion but understand that we may need to proceed with a laparotomy. Complications including but not limited to bleeding infection injury to surrounding organs need for laparotomy prolonged thromboembolic pharmacologic therapy were discussed with the patient. She understands the risk of injury  to the bowel and bladder as well as vaginal cuff dehiscence. They understand that the role for adjuvant therapy is not one that we can determine at this time but will need to wait for final pathology.   We're tentatively looking at performed her surgery on May 28. She'll be seen by anesthesia. We will insure that Dr. Tawanna Cooler her primary care physician does not have any other concerns in the perioperative period. The questions were elicited in answer to her satisfaction we'll contact us if they have any additional questions prior to her preoperative visit.  Jaran Sainz A., MD 05/12/2011, 12:03 PM

## 2011-05-19 ENCOUNTER — Telehealth: Payer: Self-pay | Admitting: Family Medicine

## 2011-05-19 NOTE — Telephone Encounter (Signed)
Pt requesting refill on  atenolol (TENORMIN) 50 MG tablet  hydrochlorothiazide (HYDRODIURIL) 25 MG tablet citalopram (CELEXA) 20 MG tablet    [facility]

## 2011-05-21 ENCOUNTER — Other Ambulatory Visit: Payer: Self-pay | Admitting: *Deleted

## 2011-05-21 MED ORDER — HYDROCHLOROTHIAZIDE 25 MG PO TABS: 25.0000 mg | ORAL_TABLET | Freq: Every morning | ORAL | Status: DC

## 2011-05-21 MED ORDER — CITALOPRAM HYDROBROMIDE 20 MG PO TABS: ORAL_TABLET | ORAL | Status: DC

## 2011-05-21 MED ORDER — ATENOLOL 50 MG PO TABS: 50.0000 mg | ORAL_TABLET | Freq: Every morning | ORAL | Status: DC

## 2011-05-25 ENCOUNTER — Other Ambulatory Visit: Payer: Self-pay | Admitting: *Deleted

## 2011-05-25 MED ORDER — CITALOPRAM HYDROBROMIDE 20 MG PO TABS: ORAL_TABLET | ORAL | Status: DC

## 2011-05-25 MED ORDER — ATENOLOL 50 MG PO TABS: 50.0000 mg | ORAL_TABLET | Freq: Every morning | ORAL | Status: DC

## 2011-05-25 MED ORDER — HYDROCHLOROTHIAZIDE 25 MG PO TABS: 25.0000 mg | ORAL_TABLET | Freq: Every morning | ORAL | Status: DC

## 2011-05-26 ENCOUNTER — Telehealth: Payer: Self-pay | Admitting: Family Medicine

## 2011-05-26 NOTE — Telephone Encounter (Signed)
Pt said that she called [facility] today to check on status of the atenolol (TENORMIN) 50 MG tablet,  citalopram (CELEXA) 20 MG tablet,  hydrochlorothiazide (HYDRODIURIL) 25 MG, and was told that nothing had been rcvd from pcp office yet. Pls send in today. Pt req to be notified when done.

## 2011-05-26 NOTE — Telephone Encounter (Signed)
rx faxed

## 2011-05-27 ENCOUNTER — Encounter (HOSPITAL_COMMUNITY): Payer: Self-pay | Admitting: Pharmacy Technician

## 2011-06-04 ENCOUNTER — Encounter (HOSPITAL_COMMUNITY): Payer: Self-pay

## 2011-06-04 ENCOUNTER — Encounter (HOSPITAL_COMMUNITY)
Admission: RE | Admit: 2011-06-04 | Discharge: 2011-06-04 | Disposition: A | Discharge status: Home / Self Care | Payer: Medicare Other | Source: Ambulatory Visit | Attending: Gynecologic Oncology | Admitting: Gynecologic Oncology

## 2011-06-04 ENCOUNTER — Inpatient Hospital Stay (HOSPITAL_COMMUNITY): Admission: RE | Admit: 2011-06-04 | Payer: Medicare Other | Source: Ambulatory Visit

## 2011-06-04 ENCOUNTER — Ambulatory Visit (HOSPITAL_COMMUNITY)
Admission: RE | Admit: 2011-06-04 | Discharge: 2011-06-04 | Disposition: A | Discharge status: Home / Self Care | Payer: Medicare Other | Source: Ambulatory Visit | Attending: Gynecologic Oncology | Admitting: Gynecologic Oncology

## 2011-06-04 DIAGNOSIS — I10 Essential (primary) hypertension: Secondary | ICD-10-CM | POA: Diagnosis not present

## 2011-06-04 DIAGNOSIS — C549 Malignant neoplasm of corpus uteri, unspecified: Secondary | ICD-10-CM | POA: Diagnosis not present

## 2011-06-04 DIAGNOSIS — I77819 Aortic ectasia, unspecified site: Secondary | ICD-10-CM | POA: Diagnosis not present

## 2011-06-04 DIAGNOSIS — I77810 Thoracic aortic ectasia: Secondary | ICD-10-CM | POA: Diagnosis not present

## 2011-06-04 DIAGNOSIS — Z01818 Encounter for other preprocedural examination: Secondary | ICD-10-CM | POA: Insufficient documentation

## 2011-06-04 DIAGNOSIS — Z01812 Encounter for preprocedural laboratory examination: Secondary | ICD-10-CM | POA: Insufficient documentation

## 2011-06-04 DIAGNOSIS — Z01811 Encounter for preprocedural respiratory examination: Secondary | ICD-10-CM | POA: Diagnosis not present

## 2011-06-04 HISTORY — DX: Malignant (primary) neoplasm, unspecified: C80.1

## 2011-06-04 LAB — SURGICAL PCR SCREEN: MRSA, PCR: NEGATIVE

## 2011-06-04 LAB — CBC
MCH: 29.4 pg (ref 26.0–34.0)
MCHC: 32.4 g/dL (ref 30.0–36.0)
MCV: 90.7 fL (ref 78.0–100.0)
Platelets: 247 10*3/uL (ref 150–400)
RBC: 4.63 MIL/uL (ref 3.87–5.11)
RDW: 14.3 % (ref 11.5–15.5)

## 2011-06-04 LAB — DIFFERENTIAL
Basophils Relative: 0 % (ref 0–1)
Eosinophils Absolute: 0.1 10*3/uL (ref 0.0–0.7)
Eosinophils Relative: 1 % (ref 0–5)
Lymphocytes Relative: 17 % (ref 12–46)
Lymphs Abs: 1.6 10*3/uL (ref 0.7–4.0)
Monocytes Absolute: 1.1 10*3/uL — ABNORMAL HIGH (ref 0.1–1.0)
Neutro Abs: 6.2 10*3/uL (ref 1.7–7.7)

## 2011-06-04 LAB — COMPREHENSIVE METABOLIC PANEL
AST: 15 U/L (ref 0–37)
CO2: 28 mEq/L (ref 19–32)
Calcium: 9.4 mg/dL (ref 8.4–10.5)
Creatinine, Ser: 0.84 mg/dL (ref 0.50–1.10)
GFR calc non Af Amer: 61 mL/min — ABNORMAL LOW (ref >90)
Sodium: 133 mEq/L — ABNORMAL LOW (ref 135–145)
Total Protein: 7.2 g/dL (ref 6.0–8.3)

## 2011-06-04 NOTE — Patient Instructions (Signed)
YOUR SURGERY IS SCHEDULED ON:  Tuesday  5/28  AT 7:15 AM  REPORT TO [facility] AT:  5:15 AM      PHONE # FOR SHORT STAY IS [phone]    DO NOT EAT OR DRINK ANYTHING AFTER MIDNIGHT THE NIGHT BEFORE YOUR SURGERY.  YOU MAY BRUSH YOUR TEETH, RINSE OUT YOUR MOUTH--BUT NO WATER, NO FOOD, NO CHEWING GUM, NO MINTS, NO CANDIES, NO CHEWING TOBACCO.  PLEASE TAKE THE FOLLOWING MEDICATIONS THE AM OF YOUR SURGERY WITH A FEW SIPS OF WATER:  ATENOLOL    IF YOU USE INHALERS--USE YOUR INHALERS THE AM OF YOUR SURGERY AND BRING INHALERS TO THE HOSPITAL -TAKE TO SURGERY.    IF YOU ARE DIABETIC:  DO NOT TAKE ANY DIABETIC MEDICATIONS THE AM OF YOUR SURGERY.  IF YOU TAKE INSULIN IN THE EVENINGS--PLEASE ONLY TAKE 1/2 NORMAL EVENING DOSE THE NIGHT BEFORE YOUR SURGERY.  NO INSULIN THE AM OF YOUR SURGERY.  IF YOU HAVE SLEEP APNEA AND USE CPAP OR BIPAP--PLEASE BRING THE MASK --NOT THE MACHINE-NOT THE TUBING   -JUST THE MASK. DO NOT BRING VALUABLES, MONEY, CREDIT CARDS.  CONTACT LENS, DENTURES / PARTIALS, GLASSES SHOULD NOT BE WORN TO SURGERY AND IN MOST CASES-HEARING AIDS WILL NEED TO BE REMOVED.  BRING YOUR GLASSES CASE, ANY EQUIPMENT NEEDED FOR YOUR CONTACT LENS. FOR PATIENTS ADMITTED TO THE HOSPITAL--CHECK OUT TIME THE DAY OF DISCHARGE IS 11:00 AM.  ALL INPATIENT ROOMS ARE PRIVATE - WITH BATHROOM, TELEPHONE, TELEVISION AND WIFI INTERNET. IF YOU ARE BEING DISCHARGED THE SAME DAY OF YOUR SURGERY--YOU CAN NOT DRIVE YOURSELF HOME--AND SHOULD NOT GO HOME ALONE BY TAXI OR BUS.  NO DRIVING OR OPERATING MACHINERY FOR 24 HOURS FOLLOWING ANESTHESIA / PAIN MEDICATIONS.                            SPECIAL INSTRUCTIONS:  CHLORHEXIDINE SOAP SHOWER (other brand names are Betasept and Hibiclens ) PLEASE SHOWER WITH CHLORHEXIDINE THE NIGHT BEFORE YOUR SURGERY AND THE AM OF YOUR SURGERY. DO NOT USE CHLORHEXIDINE ON YOUR FACE OR PRIVATE AREAS--YOU MAY USE YOUR NORMAL SOAP THOSE AREAS AND YOUR NORMAL SHAMPOO.    WOMEN SHOULD AVOID SHAVING UNDER ARMS AND SHAVING LEGS 48 HOURS BEFORE USING CHLORHEXIDINE TO AVOID SKIN IRRITATION.  DO NOT USE IF ALLERGIC TO CHLORHEXIDINE.  PLEASE READ OVER ANY  FACT SHEETS THAT YOU WERE GIVEN: MRSA INFORMATION, BLOOD TRANSFUSION INFORMATION, INCENTIVE SPIROMETER INFORMATION. REMEMBER TO DO COUGHING, DEEP BREATHING AND LEG EXERCISES AFTER YOUR SURGERY--TURNING WHILE IN BED-NOT LYING IN ONE PLACE ALL THE TIME,

## 2011-06-08 ENCOUNTER — Encounter (HOSPITAL_COMMUNITY): Payer: Self-pay

## 2011-06-08 ENCOUNTER — Encounter (HOSPITAL_COMMUNITY): Payer: Self-pay | Admitting: Anesthesiology

## 2011-06-08 ENCOUNTER — Inpatient Hospital Stay (HOSPITAL_COMMUNITY)
Admission: RE | Admit: 2011-06-08 | Discharge: 2011-06-10 | DRG: 741 | Disposition: A | Discharge status: Home / Self Care | Payer: Medicare Other | Source: Ambulatory Visit | Attending: Obstetrics & Gynecology | Admitting: Obstetrics & Gynecology

## 2011-06-08 ENCOUNTER — Encounter (HOSPITAL_COMMUNITY)
Admission: RE | Disposition: A | Discharge status: Home / Self Care | Payer: Self-pay | Source: Ambulatory Visit | Attending: Obstetrics & Gynecology

## 2011-06-08 ENCOUNTER — Ambulatory Visit (HOSPITAL_COMMUNITY): Payer: Medicare Other | Admitting: Anesthesiology

## 2011-06-08 DIAGNOSIS — F411 Generalized anxiety disorder: Secondary | ICD-10-CM | POA: Diagnosis present

## 2011-06-08 DIAGNOSIS — F329 Major depressive disorder, single episode, unspecified: Secondary | ICD-10-CM | POA: Diagnosis not present

## 2011-06-08 DIAGNOSIS — C541 Malignant neoplasm of endometrium: Secondary | ICD-10-CM | POA: Diagnosis present

## 2011-06-08 DIAGNOSIS — I10 Essential (primary) hypertension: Secondary | ICD-10-CM | POA: Diagnosis not present

## 2011-06-08 DIAGNOSIS — F3289 Other specified depressive episodes: Secondary | ICD-10-CM | POA: Diagnosis not present

## 2011-06-08 DIAGNOSIS — R339 Retention of urine, unspecified: Secondary | ICD-10-CM | POA: Diagnosis not present

## 2011-06-08 DIAGNOSIS — C549 Malignant neoplasm of corpus uteri, unspecified: Secondary | ICD-10-CM | POA: Diagnosis not present

## 2011-06-08 HISTORY — PX: LYMPH NODE DISSECTION: SHX5087

## 2011-06-08 HISTORY — PX: ROBOTIC ASSISTED LAP VAGINAL HYSTERECTOMY: SHX2362

## 2011-06-08 LAB — TYPE AND SCREEN: Antibody Screen: NEGATIVE

## 2011-06-08 SURGERY — ROBOTIC ASSISTED LAPAROSCOPIC VAGINAL HYSTERECTOMY
Anesthesia: General | Site: Abdomen | Wound class: Clean Contaminated

## 2011-06-08 MED ORDER — ATENOLOL 50 MG PO TABS
50.0000 mg | ORAL_TABLET | Freq: Every morning | ORAL | Status: DC
  Administered 2011-06-10: 50 mg via ORAL
  Filled 2011-06-08 (×2): qty 1

## 2011-06-08 MED ORDER — CEFOTETAN DISODIUM 1 G IJ SOLR
1.0000 g | INTRAMUSCULAR | Status: AC
  Administered 2011-06-08: 1 g via INTRAVENOUS
  Filled 2011-06-08: qty 1

## 2011-06-08 MED ORDER — SUCCINYLCHOLINE CHLORIDE 20 MG/ML IJ SOLN
INTRAMUSCULAR | Status: DC | PRN
  Administered 2011-06-08: 100 mg via INTRAVENOUS

## 2011-06-08 MED ORDER — STERILE WATER FOR IRRIGATION IR SOLN
Status: DC | PRN
  Administered 2011-06-08: 3000 mL

## 2011-06-08 MED ORDER — ESTRADIOL 0.1 MG/GM VA CREA
TOPICAL_CREAM | VAGINAL | Status: AC
  Filled 2011-06-08: qty 42.5

## 2011-06-08 MED ORDER — NEOSTIGMINE METHYLSULFATE 1 MG/ML IJ SOLN
INTRAMUSCULAR | Status: DC | PRN
  Administered 2011-06-08: 4 mg via INTRAVENOUS

## 2011-06-08 MED ORDER — KCL IN DEXTROSE-NACL 20-5-0.45 MEQ/L-%-% IV SOLN
INTRAVENOUS | Status: DC
  Administered 2011-06-08 (×2): via INTRAVENOUS
  Filled 2011-06-08 (×5): qty 1000

## 2011-06-08 MED ORDER — ONDANSETRON HCL 4 MG/2ML IJ SOLN
INTRAMUSCULAR | Status: DC | PRN
  Administered 2011-06-08: 4 mg via INTRAVENOUS

## 2011-06-08 MED ORDER — HYDROMORPHONE HCL PF 1 MG/ML IJ SOLN: 0.2500 mg | INTRAMUSCULAR | Status: DC | PRN

## 2011-06-08 MED ORDER — LIDOCAINE HCL (CARDIAC) 20 MG/ML IV SOLN
INTRAVENOUS | Status: DC | PRN
  Administered 2011-06-08: 80 mg via INTRAVENOUS

## 2011-06-08 MED ORDER — MENTHOL 3 MG MT LOZG
1.0000 | LOZENGE | OROMUCOSAL | Status: DC | PRN
  Filled 2011-06-08: qty 9

## 2011-06-08 MED ORDER — LACTATED RINGERS IR SOLN
Status: DC | PRN
  Administered 2011-06-08: 1000 mL

## 2011-06-08 MED ORDER — CARBOXYMETHYLCELLULOSE SODIUM 0.5 % OP SOLN: 1.0000 [drp] | Freq: Two times a day (BID) | OPHTHALMIC | Status: DC

## 2011-06-08 MED ORDER — CITALOPRAM HYDROBROMIDE 20 MG PO TABS
30.0000 mg | ORAL_TABLET | Freq: Every day | ORAL | Status: DC
  Administered 2011-06-08 – 2011-06-09 (×2): 30 mg via ORAL
  Filled 2011-06-08 (×3): qty 1

## 2011-06-08 MED ORDER — MORPHINE SULFATE 2 MG/ML IJ SOLN: 1.0000 mg | INTRAMUSCULAR | Status: DC | PRN

## 2011-06-08 MED ORDER — ONDANSETRON HCL 4 MG PO TABS: 4.0000 mg | ORAL_TABLET | Freq: Four times a day (QID) | ORAL | Status: DC | PRN

## 2011-06-08 MED ORDER — LACTATED RINGERS IV SOLN: INTRAVENOUS | Status: DC

## 2011-06-08 MED ORDER — OXYCODONE-ACETAMINOPHEN 5-325 MG PO TABS
1.0000 | ORAL_TABLET | ORAL | Status: DC | PRN
  Administered 2011-06-08 – 2011-06-09 (×2): 1 via ORAL
  Filled 2011-06-08 (×2): qty 1

## 2011-06-08 MED ORDER — ZOLPIDEM TARTRATE 5 MG PO TABS: 5.0000 mg | ORAL_TABLET | Freq: Every evening | ORAL | Status: DC | PRN

## 2011-06-08 MED ORDER — GLYCOPYRROLATE 0.2 MG/ML IJ SOLN
INTRAMUSCULAR | Status: DC | PRN
  Administered 2011-06-08: 0.6 mg via INTRAVENOUS

## 2011-06-08 MED ORDER — ONDANSETRON HCL 4 MG/2ML IJ SOLN: 4.0000 mg | Freq: Four times a day (QID) | INTRAMUSCULAR | Status: DC | PRN

## 2011-06-08 MED ORDER — ACETAMINOPHEN 10 MG/ML IV SOLN
INTRAVENOUS | Status: AC
  Filled 2011-06-08: qty 100

## 2011-06-08 MED ORDER — LACTATED RINGERS IV SOLN
INTRAVENOUS | Status: DC | PRN
  Administered 2011-06-08 (×2): via INTRAVENOUS

## 2011-06-08 MED ORDER — PROMETHAZINE HCL 25 MG/ML IJ SOLN: 6.2500 mg | INTRAMUSCULAR | Status: DC | PRN

## 2011-06-08 MED ORDER — MEPERIDINE HCL 50 MG/ML IJ SOLN: 6.2500 mg | INTRAMUSCULAR | Status: DC | PRN

## 2011-06-08 MED ORDER — POLYVINYL ALCOHOL 1.4 % OP SOLN
1.0000 [drp] | Freq: Two times a day (BID) | OPHTHALMIC | Status: DC
  Administered 2011-06-08 – 2011-06-09 (×3): 1 [drp] via OPHTHALMIC
  Filled 2011-06-08: qty 15

## 2011-06-08 MED ORDER — ROCURONIUM BROMIDE 100 MG/10ML IV SOLN
INTRAVENOUS | Status: DC | PRN
  Administered 2011-06-08: 5 mg via INTRAVENOUS
  Administered 2011-06-08: 35 mg via INTRAVENOUS

## 2011-06-08 MED ORDER — ESTRADIOL 0.1 MG/GM VA CREA
TOPICAL_CREAM | VAGINAL | Status: DC | PRN
  Administered 2011-06-08: 1 via VAGINAL

## 2011-06-08 MED ORDER — PROPOFOL 10 MG/ML IV BOLUS
INTRAVENOUS | Status: DC | PRN
  Administered 2011-06-08: 160 mg via INTRAVENOUS

## 2011-06-08 MED ORDER — FENTANYL CITRATE 0.05 MG/ML IJ SOLN
INTRAMUSCULAR | Status: DC | PRN
  Administered 2011-06-08: 50 ug via INTRAVENOUS
  Administered 2011-06-08: 100 ug via INTRAVENOUS
  Administered 2011-06-08 (×2): 50 ug via INTRAVENOUS

## 2011-06-08 MED ORDER — ACETAMINOPHEN 10 MG/ML IV SOLN
INTRAVENOUS | Status: DC | PRN
  Administered 2011-06-08: 1000 mg via INTRAVENOUS

## 2011-06-08 SURGICAL SUPPLY — 52 items
BENZOIN TINCTURE PRP APPL 2/3 (GAUZE/BANDAGES/DRESSINGS) ×2 IMPLANT
CHLORAPREP W/TINT 26ML (MISCELLANEOUS) ×2 IMPLANT
CLOTH BEACON ORANGE TIMEOUT ST (SAFETY) ×2 IMPLANT
CORD HIGH FREQUENCY UNIPOLAR (ELECTROSURGICAL) ×2 IMPLANT
CORDS BIPOLAR (ELECTRODE) ×2 IMPLANT
COVER MAYO STAND STRL (DRAPES) ×2 IMPLANT
COVER SURGICAL LIGHT HANDLE (MISCELLANEOUS) ×2 IMPLANT
COVER TIP SHEARS 8 DVNC (MISCELLANEOUS) ×1 IMPLANT
COVER TIP SHEARS 8MM DA VINCI (MISCELLANEOUS) ×1
DECANTER SPIKE VIAL GLASS SM (MISCELLANEOUS) IMPLANT
DRAPE BACK TABLE (DRAPES) IMPLANT
DRAPE LG THREE QUARTER DISP (DRAPES) ×4 IMPLANT
DRAPE SURG IRRIG POUCH 19X23 (DRAPES) ×2 IMPLANT
DRAPE TABLE BACK 44X90 PK DISP (DRAPES) ×4 IMPLANT
DRAPE UTILITY XL STRL (DRAPES) ×2 IMPLANT
DRSG TEGADERM 6X8 (GAUZE/BANDAGES/DRESSINGS) IMPLANT
ELECT REM PT RETURN 9FT ADLT (ELECTROSURGICAL) ×2
ELECTRODE REM PT RTRN 9FT ADLT (ELECTROSURGICAL) ×1 IMPLANT
GAUZE PACKING 2X5 YD STERILE (GAUZE/BANDAGES/DRESSINGS) ×2 IMPLANT
GAUZE VASELINE 3X9 (GAUZE/BANDAGES/DRESSINGS) IMPLANT
GLOVE BIO SURGEON STRL SZ 6.5 (GLOVE) ×8 IMPLANT
GLOVE BIO SURGEON STRL SZ7.5 (GLOVE) ×4 IMPLANT
GLOVE BIOGEL PI IND STRL 7.0 (GLOVE) ×2 IMPLANT
GLOVE BIOGEL PI INDICATOR 7.0 (GLOVE) ×2
GOWN PREVENTION PLUS XLARGE (GOWN DISPOSABLE) ×10 IMPLANT
HOLDER FOLEY CATH W/STRAP (MISCELLANEOUS) ×2 IMPLANT
KIT ACCESSORY DA VINCI DISP (KITS) ×1
KIT ACCESSORY DVNC DISP (KITS) ×1 IMPLANT
MANIPULATOR UTERINE 4.5 ZUMI (MISCELLANEOUS) ×2 IMPLANT
OCCLUDER COLPOPNEUMO (BALLOONS) ×2 IMPLANT
PACK LAPAROSCOPY W LONG (CUSTOM PROCEDURE TRAY) ×2 IMPLANT
POUCH SPECIMEN RETRIEVAL 10MM (ENDOMECHANICALS) ×2 IMPLANT
SET TUBE IRRIG SUCTION NO TIP (IRRIGATION / IRRIGATOR) ×2 IMPLANT
SHEET LAVH (DRAPES) ×2 IMPLANT
SOLUTION ELECTROLUBE (MISCELLANEOUS) ×2 IMPLANT
SPONGE LAP 18X18 X RAY DECT (DISPOSABLE) IMPLANT
STRIP CLOSURE SKIN 1/2X4 (GAUZE/BANDAGES/DRESSINGS) ×2 IMPLANT
SUT VIC AB 0 CT1 27 (SUTURE) ×3
SUT VIC AB 0 CT1 27XBRD ANTBC (SUTURE) ×3 IMPLANT
SUT VIC AB 4-0 PS2 27 (SUTURE) ×4 IMPLANT
SUT VICRYL 0 UR6 27IN ABS (SUTURE) ×4 IMPLANT
SYR 50ML LL SCALE MARK (SYRINGE) ×2 IMPLANT
SYR BULB IRRIGATION 50ML (SYRINGE) IMPLANT
TOWEL OR 17X26 10 PK STRL BLUE (TOWEL DISPOSABLE) ×2 IMPLANT
TOWEL OR NON WOVEN STRL DISP B (DISPOSABLE) ×2 IMPLANT
TRAP SPECIMEN MUCOUS 40CC (MISCELLANEOUS) IMPLANT
TRAY FOLEY CATH 14FRSI W/METER (CATHETERS) ×2 IMPLANT
TROCAR 12M 150ML BLUNT (TROCAR) ×2 IMPLANT
TROCAR BLADELESS OPT 5 75 (ENDOMECHANICALS) ×2 IMPLANT
TROCAR XCEL 12X100 BLDLESS (ENDOMECHANICALS) ×2 IMPLANT
TUBING FILTER THERMOFLATOR (ELECTROSURGICAL) IMPLANT
WATER STERILE IRR 1500ML POUR (IV SOLUTION) ×4 IMPLANT

## 2011-06-08 NOTE — Transfer of Care (Signed)
Immediate Anesthesia Transfer of Care Note  Patient: Zeniah Briney  Procedure(s) Performed: Procedure(s) (LRB): ROBOTIC ASSISTED LAPAROSCOPIC VAGINAL HYSTERECTOMY (N/A) ROBOTIC ASSISTED BILATERAL SALPINGO OOPHERECTOMY (N/A) LYMPH NODE DISSECTION (N/A)  Patient Location: PACU  Anesthesia Type: General  Level of Consciousness: awake, alert  and oriented  Airway & Oxygen Therapy: Patient Spontanous Breathing and Patient connected to face mask oxygen  Post-op Assessment: Report given to PACU RN and Post -op Vital signs reviewed and stable  Post vital signs: Reviewed and stable  Complications: No apparent anesthesia complications

## 2011-06-08 NOTE — Op Note (Signed)
PATIENT: [name] DATE OF BIRTH: 07/24/1921 ENCOUNTER DATE:    Preop Diagnosis: Grade 2 endometrioid adenocarcinoma.   Postoperative Diagnosis: same.   Surgery: Total robotic hysterectomy bilateral salpingo-oophorectomy, bilateral pelvic lymph node dissection.   Surgeons:  Bernita Buffy. Duard Brady, MD; Antionette Char, MD   Assistant: Telford Nab   Anesthesia: General   Estimated blood loss: 150 ml   IVF: 1500 ml   Urine output: 750 ml   Complications: None   Pathology: Uterus, cervix, bilateral tubes and ovaries, bilateral pelvic lymph nodes to pathology.   Operative findings: Globular uterus. Frozen section revealed a deeply invasive adenocarcinoma. Bilaterally slightly prominent pelvic lymph nodes right greater than left. Significant intra-abdominal small bowel precluding robotic para-aortic lymph node dissection.  Procedure: The patient was identified in the preoperative holding area. Informed consent was signed on the chart. Patient was seen history was reviewed and exam was performed.   The patient was then taken to the operating room and placed in the supine position with SCD hose on. General anesthesia was then induced without difficulty. She was then placed in the dorsolithotomy position. Her arms were tucked at her side with appropriate precautions on the gel pad. Shoulder blocks were then placed in the usual fashion with appropriate precautions. A OG-tube was placed to suction. First timeout was performed to confirm the patient procedure antibiotic allergy status, estimated estimated blood loss and OR time. The perineum was then prepped in the usual fashion with Betadine. A 14 French Foley was inserted into the bladder under sterile conditions. A sterile speculum was placed in the vagina. The cervix was without lesions. The cervix was grasped with a single-tooth tenaculum. The dilator without difficulty. A ZUMI with a small Koe ring was placed without difficulty. The  abdomen was then prepped with 1 Chlor prep sponge per protocol.   Patient was then draped after the prep was dried. Second timeout was performed to confirm the above. After again confirming OG tube placement and it was to suction. A stab-wound was made in left upper quadrant 2 cm below the costal margin on the left in the midclavicular line. A 5 mm operative report was used to assure intra-abdominal placement. The abdomen was insufflated. At this point all points during the procedure the patient's intra-abdominal pressure was not increased over 15 mm of mercury. After insufflation was complete, the patient was placed in deep Trendelenburg position. 24 cm above the pubic symphysis that area was marked the camera port. Bilateral robotic ports were marked 10 cm from the midline incision at approximately 5 angle and the fourth arm was placed approximately 9 cm below the second robotic trochars her left side.   Under direct visualization each of the trochars was placed into the abdomen. The small bowel was folded on its mesentery to allow visualization to the pelvis. The 5 mm LUQ port was then converted to a 10/12 port under direct visualization.  After assuring adequate visualization, the robot was then docked in the usual fashion. Under direct visualization the robotic instruments replaced.   The round ligament on the patient's right side was transected with monopolar cautery. The anterior and posterior leaves of the broad ligament were then taken down in the usual fashion. The ureter was identified on the medial leaf of the broad ligament. A window was made between the IP and the ureter. The IP was coagulated with bipolar cautery and transected. The posterior leaf of the broad ligament was taken down to the level of the KOH ring.  The bladder flap was created using meticulous dissection and pinpoint cautery. The uterine vessels were coagulated with bipolar cautery. The uterine vessels were then transected and the  C loop was created. The same procedure was performed on the patient's left side.   The pneumo-occulder in the vagina was then insufflated. The colpotomy was then created in the usual fashion. The specimen was then delivered to the vagina very carefully secondary to fairly stenotic perineal body  and sent for frozen section. Our attention was then drawn to opening the paravesical space on her right side the perirectal space was also opened. The obturator nerve was identified. The nodal bundle extending over the external iliac artery down to the external iliac vein was taken down using sharp dissection and monopolar cautery. The genitofemoral nerve was identified and spared. We continued our dissection down to the level of the obturator nerve. The nodal bundle superior to the obturator nerve was taken. All pedicles were noted to be hemostatic the ureter was noted to be well medial of the area of dissection. The nodal bundle was then placed and an Endo catch bag.  The same procedure was performed on her left side.  As stated above, the small bowel could not be managed as to not have it overlying the para-aortic node region.  Due to the need to proceed with a laparotomy and considering the patient's age, the decision was made to complete the procedure.  The vaginal cuff was closed with a running 0 Vicryl on CT 1 suture. The abdomen and pelvis were copiously irrigated and noted to be hemostatic. The robotic instruments were removed under direct visualization as were the robotic trochars. The pneumoperitoneum was removed. The patient was then taken out of the Trendelenburg position. Using of 0 Vicryl on a UR 6 needle the midline port port in the left upper quadrant ports were reapproximated and the fascia of the supraumbilical port was closed. The skin was closed using 4-0 Vicryl. Steri-Strips and benzoin were applied. The pneumo occluded balloon was removed from the vagina. The vagina was swabbed and noted to have  small perineal body tear that was repaired with a locking 2.0 vicryl. There were two small tears in the periurethral region and these were closed with a single suture of 2.0 vicryl.  A vaginal pack with estrogen cream was applied, though the areas was hemostatic.  All instrument needle and Ray-Tec counts were correct x2. The patient tolerated the procedure well and was taken to the recovery room in stable condition. This is Cleda Mccreedy dictating an operative note on patient [name].

## 2011-06-08 NOTE — Interval H&P Note (Signed)
History and Physical Interval Note:  06/08/2011 7:01 AM  [name]  has presented today for surgery, with the diagnosis of endometrial cancer  The various methods of treatment have been discussed with the patient and family. After consideration of risks, benefits and other options for treatment, the patient has consented to  Procedure(s) (LRB): ROBOTIC ASSISTED LAPAROSCOPIC VAGINAL HYSTERECTOMY (N/A) ROBOTIC ASSISTED BILATERAL SALPINGO OOPHERECTOMY (N/A) LYMPH NODE DISSECTION (N/A) as a surgical intervention .  The patients' history has been reviewed, patient examined, no change in status, stable for surgery.  I have reviewed the patients' chart and labs.  Questions were answered to the patient's satisfaction.     [name] A.

## 2011-06-08 NOTE — Progress Notes (Signed)
Utilization review completed.  

## 2011-06-08 NOTE — Anesthesia Preprocedure Evaluation (Addendum)
Anesthesia Evaluation  Patient identified by MRN, date of birth, ID band Patient awake    Reviewed: Allergy & Precautions, H&P , NPO status , Patient's Chart, lab work & pertinent test results  Airway Mallampati: II TM Distance: >3 FB Neck ROM: full    Dental  (+) Teeth Intact   Pulmonary neg pulmonary ROS,  breath sounds clear to auscultation  Pulmonary exam normal       Cardiovascular hypertension, Pt. on medications     Neuro/Psych PSYCHIATRIC DISORDERS Anxiety Depression negative neurological ROS  negative psych ROS   GI/Hepatic negative GI ROS, Neg liver ROS,   Endo/Other  negative endocrine ROS  Renal/GU negative Renal ROS  negative genitourinary   Musculoskeletal negative musculoskeletal ROS (+)   Abdominal Normal abdominal exam  (+)   Peds negative pediatric ROS (+)  Hematology negative hematology ROS (+)   Anesthesia Other Findings   Reproductive/Obstetrics negative OB ROS                           Anesthesia Physical  Anesthesia Plan  ASA: II  Anesthesia Plan: General   Post-op Pain Management:    Induction: Intravenous  Airway Management Planned: Oral ETT  Additional Equipment:   Intra-op Plan:   Post-operative Plan:   Informed Consent: I have reviewed the patients History and Physical, chart, labs and discussed the procedure including the risks, benefits and alternatives for the proposed anesthesia with the patient or authorized representative who has indicated his/her understanding and acceptance.   Dental Advisory Given  Plan Discussed with: CRNA and Surgeon  Anesthesia Plan Comments:         Anesthesia Quick Evaluation

## 2011-06-08 NOTE — Anesthesia Postprocedure Evaluation (Signed)
  Anesthesia Post-op Note  Patient: [name]  Procedure(s) Performed: Procedure(s) (LRB): ROBOTIC ASSISTED LAPAROSCOPIC VAGINAL HYSTERECTOMY (N/A) ROBOTIC ASSISTED BILATERAL SALPINGO OOPHERECTOMY (N/A) LYMPH NODE DISSECTION (N/A)  Patient Location: PACU  Anesthesia Type: General  Level of Consciousness: awake and alert   Airway and Oxygen Therapy: Patient Spontanous Breathing  Post-op Pain: mild  Post-op Assessment: Post-op Vital signs reviewed, Patient's Cardiovascular Status Stable, Respiratory Function Stable, Patent Airway and No signs of Nausea or vomiting  Post-op Vital Signs: stable  Complications: No apparent anesthesia complications

## 2011-06-08 NOTE — H&P (View-Only) (Signed)
Consult Note: Gyn-Onc  [name] 78 y.o. female  CC:  Chief Complaint  Patient presents with  . Endo ca    Follow up    HPI: The patient is seen today in consultation at the request of Dr. Ross. Patient is a very pleasant 78-year-old gravida 1 para 1 has been menopausal since her 4040s to 50s. She took Premarin for a few years and in was prescribed progesterone had some bleeding therefore she stopped all hormone replacement therapy. This is about 20 years ago. She began having some bleeding in July of 2012 at which time she had an endometrial biopsy that revealed an atrophic endometrium. She did well until she presented in April of 2013 with any bleeding. In view of the degree of bleeding was felt that a thorough evaluation of the endometrial cavity needed to be performed. She therefore underwent a hysteroscopy D&C. Hysteroscopic findings included a large amount of tissue present within the cavity that was more than switch to be expected for an 78-year-old. Pathology revealed a grade 2 endometrioid carcinoma. She comes in today for consultation regarding the above.  She is accompanied by her daughter today. She states she continues having a small amount of bleeding. She denies any abdominal or pelvic pain. She states that there is no significant change in her bowel or bladder habits. She does state that occasionally she feels about with bowel movements but has been going on for a few years. She occasionally also has some stool incontinence if she has some diarrhea. This will happen about once a week if she has diarrhea.  She had a colonoscopy 4 years ago that showed diverticular disease.  Interval History:   Review of Systems: She denies any chest pain, shortness of breath, nausea, fevers, chills. Has a headaches or visual changes. She denies any significant change about bladder habits. She denies any chest pain or shortness of breath as stated above she's able to climb a flight of stairs  without difficulty and her METs are greater than 4.  Current Meds:  Outpatient Encounter Prescriptions as of 05/12/2011  Medication Sig Dispense Refill  . aspirin 81 MG chewable tablet Chew 81 mg by mouth every morning.       . atenolol (TENORMIN) 50 MG tablet Take 50 mg by mouth every morning.      . calcium citrate (CALCITRATE - DOSED IN MG ELEMENTAL CALCIUM) 950 MG tablet Take 1 tablet by mouth every morning.      . carboxymethylcellulose (REFRESH TEARS) 0.5 % SOLN Apply 1 drop to eye 2 (two) times daily.      . citalopram (CELEXA) 20 MG tablet One and half tab daily  150 tablet  3  . hydrochlorothiazide (HYDRODIURIL) 25 MG tablet Take 25 mg by mouth every morning.      . psyllium (METAMUCIL) 58.6 % packet Take 1 packet by mouth every evening.         Allergy:  Allergies  Allergen Reactions  . Ace Inhibitors Cough    Social Hx:   History   Social History  . Marital Status: Widowed    Spouse Name: N/A    Number of Children: N/A  . Years of Education: N/A   Occupational History  . Not on file.   Social History Main Topics  . Smoking status: Never Smoker   . Smokeless tobacco: Never Used  . Alcohol Use: No  . Drug Use: No  . Sexually Active: No   Other Topics Concern  .   Not on file   Social History Narrative   She is from [place].  Moved to [place] to be nearer to daughter Michelle    Past Surgical Hx: Myomectomy in 1967. She had a mammogram 4 years ago. Past Surgical History  Procedure Date  . Colonscopy 2006  . Wisdom tooth extraction   . Svd     x 1  . Eye surgery     Bilateral cataract eye surgery  . Abdominal surgery     removed fibroid and one fallopian tube removed  . Appendectomy   . Dilation and curettage of uterus   . Hysteroscopy w/d&c 04/28/2011    Procedure: DILATATION AND CURETTAGE /HYSTEROSCOPY;  Surgeon: Allan Ross, MD;  Location: [facility];  Service: Gynecology;  Laterality: N/A;    Past Medical Hx:  Past Medical History  Diagnosis Date   . Hypertension   . Diverticulitis   . Depression   . Arthritis     Hands/knees/hip/lower back - no meds  . Anxiety     Family Hx: Her great niece had breast cancer in her 30s and pancreatic cancer in her 60s Family History  Problem Relation Age of Onset  . Breast cancer Sister   . Lung cancer Brother     Vitals:  Blood pressure 180/80, pulse 74, temperature 98 F (36.7 C), resp. rate 14, height 5' 3.62" (1.616 m), weight 157 lb 3.2 oz (71.305 kg).  Physical Exam: Well-nourished well-developed female in no acute distress.  Neck: Supple no lymphadenopathy no thyromegaly.  Lungs: Clear to auscultation bilaterally.  Cardiovascular: Regular rate and rhythm.  Abdomen: Well-healed incision is no evidence of incisional hernia. Abdomen is soft nontender nondistended no palpable masses or hepatomegaly.  Groins: No lymphadenopathy.  Extremities: No edema.  Pelvic: Normal external female genitalia. The vagina is atrophic the cervix is visualized with a steady flow. There is no gross visible lesions on the cervix. Bimanual examination the cervix is palpably normal the purposes of normal size shape and consistency. There are no adnexal masses. Rectal confirms.  Assessment/Plan: 78-year-old gravida 1 para 1 with a clinical stage I FIGO grade 2 endometrioid adenocarcinoma. I discussed surgery with her and her daughter. We spent greater than 30 minutes face to face time discussing the procedure. I believe that we can attempt a robotic hysterectomy bilateral salpingo-oophorectomy pelvic and an attempted para-aortic lymph node dissection. She would very much like for this to be done in a minimally invasive fashion but understand that we may need to proceed with a laparotomy. Complications including but not limited to bleeding infection injury to surrounding organs need for laparotomy prolonged thromboembolic pharmacologic therapy were discussed with the patient. She understands the risk of injury  to the bowel and bladder as well as vaginal cuff dehiscence. They understand that the role for adjuvant therapy is not one that we can determine at this time but will need to wait for final pathology.   We're tentatively looking at performed her surgery on May 28. She'll be seen by anesthesia. We will insure that Dr. Todd her primary care physician does not have any other concerns in the perioperative period. The questions were elicited in answer to her satisfaction we'll contact us if they have any additional questions prior to her preoperative visit.  Oreatha Fabry A., MD 05/12/2011, 12:03 PM   

## 2011-06-09 LAB — CBC
HCT: 34.3 % — ABNORMAL LOW (ref 36.0–46.0)
Hemoglobin: 11 g/dL — ABNORMAL LOW (ref 12.0–15.0)
MCH: 29.3 pg (ref 26.0–34.0)
MCHC: 32.1 g/dL (ref 30.0–36.0)
MCV: 91.2 fL (ref 78.0–100.0)
RBC: 3.76 MIL/uL — ABNORMAL LOW (ref 3.87–5.11)

## 2011-06-09 LAB — BASIC METABOLIC PANEL
BUN: 10 mg/dL (ref 6–23)
CO2: 30 mEq/L (ref 19–32)
Calcium: 8.2 mg/dL — ABNORMAL LOW (ref 8.4–10.5)
Creatinine, Ser: 0.91 mg/dL (ref 0.50–1.10)
GFR calc non Af Amer: 55 mL/min — ABNORMAL LOW (ref >90)
Glucose, Bld: 122 mg/dL — ABNORMAL HIGH (ref 70–99)

## 2011-06-09 MED ORDER — HYDROCHLOROTHIAZIDE 25 MG PO TABS
25.0000 mg | ORAL_TABLET | Freq: Every day | ORAL | Status: DC
  Administered 2011-06-09 – 2011-06-10 (×2): 25 mg via ORAL
  Filled 2011-06-09 (×3): qty 1

## 2011-06-09 MED ORDER — OXYCODONE-ACETAMINOPHEN 5-325 MG PO TABS: 1.0000 | ORAL_TABLET | ORAL | Status: AC | PRN

## 2011-06-09 NOTE — Discharge Instructions (Addendum)
06/09/2011  Activity: 1. Be up and out of the bed during the day.  Take a nap if needed.  You may walk up steps but be careful and use the hand rail.  Stair climbing will tire you more than you think, you may need to stop part way and rest.   2. No lifting or straining for 6 weeks.  3. Do Not drive if you are taking narcotic pain medicine.  4. Shower daily.  Use soap and water on your incision and pat dry; don't rub.   5. No sexual activity and nothing in the vagina for 8 weeks.  Diet: 1. Low sodium Heart Healthy Diet is recommended.  2. It is safe to use a laxative if you have difficulty moving your bowels.   Wound Care: 1. Keep clean and dry.  Shower daily.  Reasons to call the Doctor:   Fever - Oral temperature greater than 100.4 degrees Fahrenheit  Foul-smelling vaginal discharge  Difficulty urinating  Nausea and vomiting  Increased pain at the site of the incision that is unrelieved with pain medicine.  Difficulty breathing with or without chest pain  New calf pain especially if only on one side  Sudden, continuing increased vaginal bleeding with or without clots.   Contacts: For questions or concerns you should contact:  Dr. Antionette Char at [phone]  Dr. Duard Brady at [facility] [phone]  Robotic Assisted Total Laparoscopic Hysterectomy  A total laparoscopic hysterectomy is a minimally invasive surgery to remove your uterus and cervix. This surgery is performed by making several small cuts (incisions) in your abdomen. It can also be done with a thin, lighted tube (laparoscope) inserted into 2 small incisions in the lower abdomen. Your fallopian tubes and ovaries can be removed (bilateral salpingo-oopherectomy) during this surgery as well.If a total laparoscopic hysterectomy is started and it is not safe to continue, the laparoscopic surgery will be converted to an open abdominal surgery. You will not have menstrual periods or be able to get pregnant after  having this surgery. If a bilateral salpingo-oopherectomy was performed before menopause, you will go through a sudden (abrupt) menopause. This can be helped with hormone medicines. Benefits of minimally invasive surgery include:  Less pain.   Less risk of blood loss.   Less risk of infection.   Quicker return to normal activities.   Usually a 1 night stay in the hospital.   Overall patient satisfaction.  LET YOUR CAREGIVER KNOW ABOUT:  Any history of abnormal Pap tests.   Allergies to food or medicine.   Medicines taken, including vitamins, herbs, eyedrops, over-the-counter medicines, and creams.   Use of steroids (by mouth or creams).   Previous problems with anesthetics or numbing medicines.   History of bleeding problems or blood clots.   Previous surgery.   Other health problems, including diabetes and kidney problems.   Desire for future fertility.   Any infections or colds you may have developed.   Symptoms of irregular or heavy periods, weight loss, or urinary or bowel changes.  RISKS AND COMPLICATIONS   Bleeding.   Blood clots in the legs or lung.   Infection.   Injury to surrounding organs.   Problems with anesthesia.   Early menopause symptoms (hot flashes, night sweats, insomnia).   Risk of conversion to an open abdominal incision.  BEFORE THE PROCEDURE  Ask your caregiver about changing or stopping your regular medicines.   Do not take aspirin or blood thinners (anticoagulants) for 1 week before the surgery,  or as told by your caregiver.   Do not eat or drink anything for 8 hours before the surgery, or as told by your caregiver.   Quit smoking if you smoke.   Arrange for a ride home after surgery and for someone to help you at home during recovery.  PROCEDURE   You will be given antibiotic medicine.   An intravenous (IV) line will be placed in your arm. You will be given medicine to make you sleep (general anesthetic).   A gas (carbon  dioxide) will be used to inflate your abdomen. This will allow your surgeon to look inside your abdomen, perform your surgery, and treat any other problems found if necessary.   Three or four small incisions (often less than  inch) will be made in your abdomen. One of these incisions will be made in the area of your belly button (navel). The laparoscope will be inserted into the incision. Your surgeon will look through the laparoscope while doing your procedure.   Other surgical instruments will be inserted through the other incisions.   The uterus may be removed through the vagina or cut into small pieces and removed through the small incisions.   Your incisions will be closed.  AFTER THE PROCEDURE  The gas will be released from inside your abdomen.   You will be taken to the recovery area where a nurse will watch and check your progress. Once you are awake, stable, and taking fluids well, without other problems, you will return to your room or be allowed to go home.   There is usually minimal discomfort following the surgery because the incisions are so small.   You will be given pain medicine while you are in the hospital and for when you go home.   Try to have someone with you the first 3 to 5 days after you go home.   Follow up with your surgeon in 2 to 4 weeks after surgery to evaluate your progress.  Document Released: 10/25/2006 Document Revised: 12/17/2010 Document Reviewed: 08/14/2010 [facility] Patient Information 2012 [place], [place].

## 2011-06-09 NOTE — Discharge Summary (Signed)
Physician Discharge Summary  Patient ID: [name] MRN: [id number] DOB/AGE: 1921/05/09 78 y.o.  Admit date: 06/08/2011 Discharge date: 06/10/2011  Admission Diagnoses: Endometrial ca  Discharge Diagnoses:  Principal Problem:  *Endometrial ca  Discharged Condition: good  Hospital Course: On 06/08/2011, the patient underwent the following: Procedure(s): ROBOTIC ASSISTED LAPAROSCOPIC VAGINAL HYSTERECTOMY ROBOTIC ASSISTED BILATERAL SALPINGO OOPHERECTOMY LYMPH NODE DISSECTION.   The postoperative course was remarkable for transient urinary retention.  She was discharged to home on postoperative day 2 tolerating a regular diet.  Consults: None  Significant Diagnostic Studies: None  Treatments: surgery: See above  Discharge Exam: Blood pressure 119/63, pulse 56, temperature 97.7 F (36.5 C), temperature source Oral, resp. rate 18, height 5' 3.5" (1.613 m), weight 160 lb (72.576 kg), SpO2 98.00%. General appearance: alert and cooperative Resp: clear to auscultation bilaterally Cardio: regular rate and rhythm, S1, S2 normal, no murmur, click, rub or gallop GI: soft, non-tender; bowel sounds normal; no masses,  no organomegaly Extremities: extremities normal, atraumatic, no cyanosis or edema Incision/Wound: C/D/I  Disposition: 01-Home or Self Care  Discharge Orders    Future Orders Please Complete By Expires   Diet - low sodium heart healthy      Increase activity slowly      Driving Restrictions      Comments:   Do not take narcotics and drive.   Lifting restrictions      Comments:   No lifting greater than 30 lbs.   Sexual Activity Restrictions      Comments:   No sexual activity for 8 weeks.   Call MD for:  temperature >100.4      Call MD for:  persistant nausea and vomiting      Call MD for:  severe uncontrolled pain      Call MD for:  redness, tenderness, or signs of infection (pain, swelling, redness, odor or green/yellow discharge around incision site)      Call  MD for:  difficulty breathing, headache or visual disturbances      Call MD for:  hives      Call MD for:  persistant dizziness or light-headedness      Call MD for:  extreme fatigue        Medication List  As of 06/09/2011  8:33 AM   TAKE these medications         aspirin 81 MG chewable tablet   Chew 81 mg by mouth every morning.      atenolol 50 MG tablet   Commonly known as: TENORMIN   Take 1 tablet (50 mg total) by mouth every morning.      calcium citrate 950 MG tablet   Commonly known as: CALCITRATE - dosed in mg elemental calcium   Take 1 tablet by mouth every morning.      citalopram 20 MG tablet   Commonly known as: CELEXA   Take 30 mg by mouth at bedtime. One and half tab daily      hydrochlorothiazide 25 MG tablet   Commonly known as: HYDRODIURIL   Take 1 tablet (25 mg total) by mouth every morning.      oxyCODONE-acetaminophen 5-325 MG per tablet   Commonly known as: PERCOCET   Take 1-2 tablets by mouth every 4 (four) hours as needed (moderate to severe pain (when tolerating fluids)).      psyllium 58.6 % packet   Commonly known as: METAMUCIL   Take 1 packet by mouth every evening.      REFRESH  TEARS 0.5 % Soln   Generic drug: carboxymethylcellulose   Apply 1 drop to eye 2 (two) times daily.           Follow-up Information    Please follow up. (Call Telford Nab [phone])          Signed: CROSS, MELISSA DEAL 06/09/2011, 8:33 AM

## 2011-06-10 DIAGNOSIS — R339 Retention of urine, unspecified: Secondary | ICD-10-CM | POA: Diagnosis not present

## 2011-06-10 MED ORDER — PANTOPRAZOLE SODIUM 20 MG PO TBEC
20.0000 mg | DELAYED_RELEASE_TABLET | Freq: Once | ORAL | Status: AC
  Administered 2011-06-10: 20 mg via ORAL
  Filled 2011-06-10: qty 1

## 2011-06-10 NOTE — Progress Notes (Signed)
2 Days Post-Op Procedure(s) (LRB): ROBOTIC ASSISTED LAPAROSCOPIC VAGINAL HYSTERECTOMY (N/A) ROBOTIC ASSISTED BILATERAL SALPINGO OOPHERECTOMY (N/A) LYMPH NODE DISSECTION (N/A)  Subjective: Patient reports  no problems voiding.  GERD sxs.  Objective: Vital signs in last 24 hours: Temp:  [97.8 F (36.6 C)-98.3 F (36.8 C)] 97.8 F (36.6 C) (05/30 0600) Pulse Rate:  [53-64] 63  (05/30 0600) Resp:  [18-20] 18  (05/30 0600) BP: (106-158)/(45-90) 144/78 mmHg (05/30 0600) SpO2:  [93 %-98 %] 96 % (05/30 0600) Last BM Date: 06/08/11  Intake/Output from previous day: 05/29 0701 - 05/30 0700 In: 660 [P.O.:660] Out: 2400 [Urine:2400]  Physical Examination: General: alert GI: soft, non-tender; bowel sounds normal; no masses,  no organomegaly and incision: clean, dry and intact Extremities: extremities normal, atraumatic, no cyanosis or edema  Labs:       Assessment:  78 y.o. s/p Procedure(s): ROBOTIC ASSISTED LAPAROSCOPIC VAGINAL HYSTERECTOMY ROBOTIC ASSISTED BILATERAL SALPINGO OOPHERECTOMY LYMPH NODE DISSECTION: tolerating diet Pain:  Pain is well-controlled on  oral medications.   CV: Hypertension:  controlled.  GI:  Tolerating po: Yes.  ?GERD--h/o PPI use   Prophylaxis: intermittent pneumatic compression boots.  Plan: Encourage ambulation Discharge home PPI now The patient is to be discharged to home.   LOS: 2 days    [name] A 06/10/2011, 8:15 AM

## 2011-06-11 ENCOUNTER — Encounter (HOSPITAL_COMMUNITY): Payer: Self-pay | Admitting: Gynecologic Oncology

## 2011-06-12 LAB — URINE CULTURE: Colony Count: >=100000

## 2011-06-16 ENCOUNTER — Encounter: Payer: Self-pay | Admitting: Gynecologic Oncology

## 2011-06-16 ENCOUNTER — Ambulatory Visit: Payer: Medicare Other | Attending: Gynecologic Oncology | Admitting: Gynecologic Oncology

## 2011-06-16 VITALS — BP 134/64 | HR 74 | Temp 98.3°F | Resp 16 | Ht 63.62 in | Wt 163.8 lb

## 2011-06-16 DIAGNOSIS — Z90710 Acquired absence of both cervix and uterus: Secondary | ICD-10-CM | POA: Diagnosis not present

## 2011-06-16 DIAGNOSIS — K5732 Diverticulitis of large intestine without perforation or abscess without bleeding: Secondary | ICD-10-CM | POA: Diagnosis not present

## 2011-06-16 DIAGNOSIS — Z801 Family history of malignant neoplasm of trachea, bronchus and lung: Secondary | ICD-10-CM | POA: Insufficient documentation

## 2011-06-16 DIAGNOSIS — M129 Arthropathy, unspecified: Secondary | ICD-10-CM | POA: Diagnosis not present

## 2011-06-16 DIAGNOSIS — Z803 Family history of malignant neoplasm of breast: Secondary | ICD-10-CM | POA: Insufficient documentation

## 2011-06-16 DIAGNOSIS — F329 Major depressive disorder, single episode, unspecified: Secondary | ICD-10-CM | POA: Insufficient documentation

## 2011-06-16 DIAGNOSIS — I10 Essential (primary) hypertension: Secondary | ICD-10-CM | POA: Insufficient documentation

## 2011-06-16 DIAGNOSIS — Z7982 Long term (current) use of aspirin: Secondary | ICD-10-CM | POA: Insufficient documentation

## 2011-06-16 DIAGNOSIS — C541 Malignant neoplasm of endometrium: Secondary | ICD-10-CM

## 2011-06-16 DIAGNOSIS — C549 Malignant neoplasm of corpus uteri, unspecified: Secondary | ICD-10-CM | POA: Insufficient documentation

## 2011-06-16 DIAGNOSIS — F411 Generalized anxiety disorder: Secondary | ICD-10-CM | POA: Insufficient documentation

## 2011-06-16 DIAGNOSIS — F3289 Other specified depressive episodes: Secondary | ICD-10-CM | POA: Insufficient documentation

## 2011-06-16 NOTE — Progress Notes (Signed)
Consult Note: Gyn-Onc  [name] 78 y.o. female  CC:  Chief Complaint  Patient presents with  . Endo ca    Follow up    HPI: Patient is a very pleasant 78 year old gravida 1 para 1 has been menopausal since her 83s to 33s. She took Premarin for a few years and in was prescribed progesterone had some bleeding therefore she stopped all hormone replacement therapy. This is about 20 years ago. She began having some bleeding in July of 2012 at which time she had an endometrial biopsy that revealed an atrophic endometrium. She did well until she presented in April of 2013 with any bleeding. In view of the degree of bleeding was felt that a thorough evaluation of the endometrial cavity needed to be performed. She therefore underwent a hysteroscopy D&C. Hysteroscopic findings included a large amount of tissue present within the cavity that was more than switch to be expected for an 78 year old. Pathology revealed a grade 2 endometrioid carcinoma.    Interval History:  She underwent a robotic hysterectomy BSO and pelvic lymph node dissection on May 28 of year 2013. Operative findings included a globular uterus. Frozen section revealed deeply invasive adenocarcinoma. She bilaterally prominent pelvic lymph nodes right greater than left. Significant intra-abdominal small bowel precluded robotic para-aortic lymph node dissection. Uterus +/- tubes/ovaries, neoplastic - INVASIVE ENDOMETRIOID CARCINOMA, FIGO GRADE II, INVADING INTO THE OUTER HALF OF THE MYOMETRIUM WITH ANGIOLYMPHATIC INVASION PRESENT. NO EVIDENCE OF CERVICAL STROMAL INVOLVEMENT IDENTIFIED. - LEIOMYOMATA. - CERVIX: BENIGN SQUAMOUS MUCOSA AND ENDOCERVICAL MUCOSA, NO DYSPLASIA OR MALIGNANCY. - BILATERAL OVARIES: BENIGN OVARIAN TISSUE WITH ENDOSALPINGIOSIS, NO EVIDENCE OF ATYPIA OR MALIGNANCY. - BILATERAL FALLOPIAN TUBES: BENIGN FALLOPIAN TUBAL TISSUE WITH PARATUBAL CYSTS, NO EVIDENCE OF ATYPIA OR MALIGNANCY. 2. Lymph nodes, regional  resection, left pelvic - FIVE LYMPH NODES, NEGATIVE FOR METASTATIC CARCINOMA (0/5). 3. Lymph nodes, regional resection, right pelvic - SIX BENIGN LYMPH NODES, NEGATIVE FOR METASTATIC CARCINOMA (0/6). Microscopic Comment 1. UTERUS Specimen: Uterus, bilateral ovaries and fallopian tubes. Procedure: Total hysterectomy and salpingo- oophorectomy. Lymph node sampling performed: Yes. Specimen integrity: Intact. Maximum tumor size (cm): 6.0 cm, gross measurement. Histologic type: Invasive endometrioid carcinoma. Grade: FIGO grade II. Myometrial invasion: 3.0 cm where myometrium is 3.1 cm in thickness. Cervical stromal involvement: No. Extent of involvement of other organs: No. Angiolymphatic invasion: Present. Peritoneal washings: N/A. Lymph nodes: number examined 11; number positive 0.  Review of Systems: She comes in 3 postoperative check but to discuss pathology today. She is accompanied by her daughter. She's eating quite well has gained 6 pounds since her preop visit. As normal resumption of bowel and bladder habits. She is very happy to not have any vaginal bleeding. She has no pain Current Meds:  Outpatient Encounter Prescriptions as of 06/16/2011  Medication Sig Dispense Refill  . aspirin 81 MG chewable tablet Chew 81 mg by mouth every morning.       Marland Kitchen atenolol (TENORMIN) 50 MG tablet Take 1 tablet (50 mg total) by mouth every morning.  90 tablet  0  . calcium citrate (CALCITRATE - DOSED IN MG ELEMENTAL CALCIUM) 950 MG tablet Take 1 tablet by mouth every morning.      . carboxymethylcellulose (REFRESH TEARS) 0.5 % SOLN Apply 1 drop to eye 2 (two) times daily.      . citalopram (CELEXA) 20 MG tablet Take 30 mg by mouth at bedtime. One and half tab daily      . hydrochlorothiazide (HYDRODIURIL) 25 MG tablet Take 1 tablet (  25 mg total) by mouth every morning.  90 tablet  0  . psyllium (METAMUCIL) 58.6 % packet Take 1 packet by mouth every evening.       Marland Kitchen oxyCODONE-acetaminophen  (PERCOCET) 5-325 MG per tablet Take 1-2 tablets by mouth every 4 (four) hours as needed (moderate to severe pain (when tolerating fluids)).  30 tablet  0    Allergy:  Allergies  Allergen Reactions  . Ace Inhibitors Cough    Social Hx:   History   Social History  . Marital Status: Widowed    Spouse Name: N/A    Number of Children: N/A  . Years of Education: N/A   Occupational History  . Not on file.   Social History Main Topics  . Smoking status: Never Smoker   . Smokeless tobacco: Never Used  . Alcohol Use: No  . Drug Use: No  . Sexually Active: No   Other Topics Concern  . Not on file   Social History Narrative   She is from [place].  Moved to [facility] to be nearer to daughter [name]    Past Surgical Hx:  Past Surgical History  Procedure Date  . Colonscopy 2006  . Wisdom tooth extraction   . Svd     x 1  . Eye surgery     Bilateral cataract eye surgery  . Abdominal surgery     removed fibroid and one fallopian tube removed  . Appendectomy   . Dilation and curettage of uterus   . Hysteroscopy w/d&c 04/28/2011    Procedure: DILATATION AND CURETTAGE /HYSTEROSCOPY;  Surgeon: Miguel Aschoff, MD;  Location: [facility];  Service: Gynecology;  Laterality: N/A;  . Robotic assisted lap vaginal hysterectomy 06/08/2011    Procedure: ROBOTIC ASSISTED LAPAROSCOPIC VAGINAL HYSTERECTOMY;  Surgeon: Rejeana Brock A. Duard Brady, MD;  Location: [facility];  Service: Gynecology;  Laterality: N/A;  . Lymph node dissection 06/08/2011    Procedure: LYMPH NODE DISSECTION;  Surgeon: Rejeana Brock A. Duard Brady, MD;  Location: [facility];  Service: Gynecology;  Laterality: N/A;  Possible Lymph Nodes    Past Medical Hx:  Past Medical History  Diagnosis Date  . Hypertension   . Diverticulitis   . Depression   . Arthritis     Hands/knees/hip/lower back - no meds  . Anxiety   . Cancer     ENDOMETRIAL CANCER    Family Hx:  Family History  Problem Relation Age of Onset  . Breast cancer Sister   . Lung cancer  Brother     Vitals:  Blood pressure 134/64, pulse 74, temperature 98.3 F (36.8 C), temperature source Oral, resp. rate 16, height 5' 3.62" (1.616 m), weight 163 lb 12.8 oz (74.299 kg).  Physical Exam:  Well-nourished well-developed female who appears younger than stated age in no acute distress.  Abdomen: Well-healed surgical incisions. Steri-Strips were removed. She has ecchymosis and bruising around the site. Abdomen is soft and nontender.  Extremities: No edema.  Assessment/Plan: 78 year old with a stage IB grade 2 endometrioid adenocarcinoma who has almost 100% myometrial invasion. Pelvic nodes were negative. I spoke with her and her daughter that we did not assess the para-aortic lymph nodes. However, as she has negative pelvic nodes I believe the risk of having positive para-aortic nodes is on the order of about 3%. I would recommend a CT scan if they would do something with the information. This point she's not believe that she would want chemotherapy so she's not sure she would undergo a CT scan and she  may not choose to take action on the findings. I think this is reasonable. We also discussed that based on prospective data that she would benefit from radiation due to approximately a 5% risk of vaginal cuff recurrence. We discussed that radiation therapy would decrease this risk by approximately 50%. I discussed the process of vaginal cuff brachii therapy with her and her daughter. At this point she's not sure of she would like to do that. She'll keep a postoperative appointment to see me in approximately 4-6 weeks. She was encouraged to call me with any questions prior to that time. We'll determine her final disposition at the time of her postop visit to  [facility] A., MD 06/16/2011, 1:29 PM

## 2011-06-16 NOTE — Patient Instructions (Signed)
RTC 4-6 weeks

## 2011-07-28 ENCOUNTER — Other Ambulatory Visit: Payer: Self-pay | Admitting: Family Medicine

## 2011-07-29 NOTE — Telephone Encounter (Signed)
Time for an office visit 

## 2011-08-11 ENCOUNTER — Ambulatory Visit: Payer: Medicare Other | Attending: Gynecologic Oncology | Admitting: Gynecologic Oncology

## 2011-08-11 ENCOUNTER — Encounter: Payer: Self-pay | Admitting: Gynecologic Oncology

## 2011-08-11 VITALS — BP 134/76 | HR 60 | Temp 98.1°F | Resp 16 | Wt 162.0 lb

## 2011-08-11 DIAGNOSIS — Z803 Family history of malignant neoplasm of breast: Secondary | ICD-10-CM | POA: Insufficient documentation

## 2011-08-11 DIAGNOSIS — C549 Malignant neoplasm of corpus uteri, unspecified: Secondary | ICD-10-CM | POA: Diagnosis not present

## 2011-08-11 DIAGNOSIS — Z801 Family history of malignant neoplasm of trachea, bronchus and lung: Secondary | ICD-10-CM | POA: Diagnosis not present

## 2011-08-11 DIAGNOSIS — I10 Essential (primary) hypertension: Secondary | ICD-10-CM | POA: Diagnosis not present

## 2011-08-11 DIAGNOSIS — C541 Malignant neoplasm of endometrium: Secondary | ICD-10-CM

## 2011-08-11 NOTE — Progress Notes (Signed)
Consult Note: Gyn-Onc  [name] 78 y.o. female  CC:  Chief Complaint  Patient presents with  . Endo ca    Follow up    HPI: Patient is a very pleasant 78 year old gravida 1 para 1 has been menopausal since her 97s to 48s. She took Premarin for a few years and in was prescribed progesterone had some bleeding therefore she stopped all hormone replacement therapy. This is about 20 years ago. She began having some bleeding in July of 2012 at which time she had an endometrial biopsy that revealed an atrophic endometrium. She did well until she presented in April of 2013 with any bleeding. In view of the degree of bleeding was felt that a thorough evaluation of the endometrial cavity needed to be performed. She therefore underwent a hysteroscopy D&C. Hysteroscopic findings included a large amount of tissue present within the cavity that was more than switch to be expected for an 78 year old. Pathology revealed a grade 2 endometrioid carcinoma.    Interval History:  She underwent a robotic hysterectomy BSO and pelvic lymph node dissection on May 28 of year 2013. Operative findings included a globular uterus. Frozen section revealed deeply invasive adenocarcinoma. She bilaterally prominent pelvic lymph nodes right greater than left. Significant intra-abdominal small bowel precluded robotic para-aortic lymph node dissection. Uterus +/- tubes/ovaries, neoplastic - INVASIVE ENDOMETRIOID CARCINOMA, FIGO GRADE II, INVADING INTO THE OUTER HALF OF THE MYOMETRIUM WITH ANGIOLYMPHATIC INVASION PRESENT. NO EVIDENCE OF CERVICAL STROMAL INVOLVEMENT IDENTIFIED. - LEIOMYOMATA. - CERVIX: BENIGN SQUAMOUS MUCOSA AND ENDOCERVICAL MUCOSA, NO DYSPLASIA OR MALIGNANCY. - BILATERAL OVARIES: BENIGN OVARIAN TISSUE WITH ENDOSALPINGIOSIS, NO EVIDENCE OF ATYPIA OR MALIGNANCY. - BILATERAL FALLOPIAN TUBES: BENIGN FALLOPIAN TUBAL TISSUE WITH PARATUBAL CYSTS, NO EVIDENCE OF ATYPIA OR MALIGNANCY. 2. Lymph nodes, regional  resection, left pelvic - FIVE LYMPH NODES, NEGATIVE FOR METASTATIC CARCINOMA (0/5). 3. Lymph nodes, regional resection, right pelvic - SIX BENIGN LYMPH NODES, NEGATIVE FOR METASTATIC CARCINOMA (0/6). Microscopic Comment 1. UTERUS Specimen: Uterus, bilateral ovaries and fallopian tubes. Procedure: Total hysterectomy and salpingo- oophorectomy. Lymph node sampling performed: Yes. Specimen integrity: Intact. Maximum tumor size (cm): 6.0 cm, gross measurement. Histologic type: Invasive endometrioid carcinoma. Grade: FIGO grade II. Myometrial invasion: 3.0 cm where myometrium is 3.1 cm in thickness. Cervical stromal involvement: No. Extent of involvement of other organs: No. Angiolymphatic invasion: Present. Peritoneal washings: N/A. Lymph nodes: number examined 11; number positive 0.  Review of Systems: She comes in today for her postoperative check. She is accompanied by her daughter. She's eating quite well has gained 6 pounds since her preop visit. As normal resumption of bowel and bladder habits. She is very happy to not have any vaginal bleeding. She has no pain.  She does have several concerns. The first one and the one that is the most significant issue is her insomnia. She has had insomnia for about 2-3 years since she had put her husband in a nursing home and that he passed away 2 years ago in 2022-10-30. She been on Ambien in the past but this. Her primary physician said he was worried that she would fall during the night.  Another sleep aid was then tried which not help her fall asleep even when she doubled the dose without his recommendation. She states that she falls asleep about 2 to 3:00 in the morning and awakens at about 10:00 in the morning. She has tried chamomile tea to help her sleep. Her daughter states that she does take naps during the day and the  patient states she does not. As she would very much like to asleep aid. She was not willing to really try any other sleep hygiene  maneuvers. She also does state that she would funny feeling which she urinates. She cannot tell at the beginning at the end of her bladder emptying and it appears to be somewhat self-limited. She's also complaining of dryness on the labia minora. This has been a long-standing issue for her. She was previously used Premarin cream and let's know if she continues that again. She is complaining of her arthritis pain in her hip in her knees. Does not wake her be as active as she would like to be. She is tired.  Current Meds:  Outpatient Encounter Prescriptions as of 06/16/2011  Medication Sig Dispense Refill  . aspirin 81 MG chewable tablet Chew 81 mg by mouth every morning.       Marland Kitchen atenolol (TENORMIN) 50 MG tablet Take 1 tablet (50 mg total) by mouth every morning.  90 tablet  0  . calcium citrate (CALCITRATE - DOSED IN MG ELEMENTAL CALCIUM) 950 MG tablet Take 1 tablet by mouth every morning.      . carboxymethylcellulose (REFRESH TEARS) 0.5 % SOLN Apply 1 drop to eye 2 (two) times daily.      . citalopram (CELEXA) 20 MG tablet Take 30 mg by mouth at bedtime. One and half tab daily      . hydrochlorothiazide (HYDRODIURIL) 25 MG tablet Take 1 tablet (25 mg total) by mouth every morning.  90 tablet  0  . psyllium (METAMUCIL) 58.6 % packet Take 1 packet by mouth every evening.       Marland Kitchen oxyCODONE-acetaminophen (PERCOCET) 5-325 MG per tablet Take 1-2 tablets by mouth every 4 (four) hours as needed (moderate to severe pain (when tolerating fluids)).  30 tablet  0    Allergy:  Allergies  Allergen Reactions  . Ace Inhibitors Cough    Social Hx:   History   Social History  . Marital Status: Widowed    Spouse Name: N/A    Number of Children: N/A  . Years of Education: N/A   Occupational History  . Not on file.   Social History Main Topics  . Smoking status: Never Smoker   . Smokeless tobacco: Never Used  . Alcohol Use: No  . Drug Use: No  . Sexually Active: No   Other Topics Concern  . Not on  file   Social History Narrative   She is from [place].  Moved to [facility] to be nearer to daughter [name]    Past Surgical Hx:  Past Surgical History  Procedure Date  . Colonscopy 2006  . Wisdom tooth extraction   . Svd     x 1  . Eye surgery     Bilateral cataract eye surgery  . Abdominal surgery     removed fibroid and one fallopian tube removed  . Appendectomy   . Dilation and curettage of uterus   . Hysteroscopy w/d&c 04/28/2011    Procedure: DILATATION AND CURETTAGE /HYSTEROSCOPY;  Surgeon: Miguel Aschoff, MD;  Location: [facility];  Service: Gynecology;  Laterality: N/A;  . Robotic assisted lap vaginal hysterectomy 06/08/2011    Procedure: ROBOTIC ASSISTED LAPAROSCOPIC VAGINAL HYSTERECTOMY;  Surgeon: Rejeana Brock A. Duard Brady, MD;  Location: [facility];  Service: Gynecology;  Laterality: N/A;  . Lymph node dissection 06/08/2011    Procedure: LYMPH NODE DISSECTION;  Surgeon: Rejeana Brock A. Duard Brady, MD;  Location: [facility];  Service: Gynecology;  Laterality: N/A;  Possible Lymph Nodes    Past Medical Hx:  Past Medical History  Diagnosis Date  . Hypertension   . Diverticulitis   . Depression   . Arthritis     Hands/knees/hip/lower back - no meds  . Anxiety   . Cancer     ENDOMETRIAL CANCER    Family Hx:  Family History  Problem Relation Age of Onset  . Breast cancer Sister   . Lung cancer Brother     Vitals:  Blood pressure 134/64, pulse 74, temperature 98.3 F (36.8 C), temperature source Oral, resp. rate 16, height 5' 3.62" (1.616 m), weight 163 lb 12.8 oz (74.299 kg).  Physical Exam:  Well-nourished well-developed female who appears younger than stated age in no acute distress.  Abdomen: Well-healed surgical incisions. Steri-Strips were removed. She has ecchymosis and bruising around the site. Abdomen is soft and nontender.  Pelvic: Normal external female genitalia. The vagina is atrophic. The vaginal cuff is well-healed. Several tiny 1-2 mm area of granulation tissue. The cuff is  intact. Bimanual examination reveals no masses the  Extremities: No edema.  Assessment/Plan: 78 year old with a stage IB grade 2 endometrioid adenocarcinoma who has almost 100% myometrial invasion. Pelvic nodes were negative. I spoke with her and her daughter that we did not assess the para-aortic lymph nodes. However, as she has negative pelvic nodes I believe the risk of having positive para-aortic nodes is on the order of about 3%. I would recommend a CT scan if they would do something with the information. This point she's not believe that she would want chemotherapy so she's not sure she would undergo a CT scan and she may not choose to take action on the findings. I think this is reasonable. We also discussed that based on prospective data that she would benefit from radiation due to approximately a 5% risk of vaginal cuff recurrence. We discussed that radiation therapy would decrease this risk by approximately 50%. I discussed the process of vaginal cuff brachytherapy with her and her daughter.  She states she and her daughter have discussed additional therapy at this point she does not wish to proceed with any additional treatment.  She would like to have close followup however. She will return to see [place] in 4 months. I did provide her a prescription for trazodone. We started 25 mg each bedtime when necessary insomnia. She can and follow up with her primary physician if she sees him sooner for dose escalation or would be happy to address this issue when she returns to see [place].  Milania Haubner A., MD 06/16/2011, 1:29 PM

## 2011-08-11 NOTE — Patient Instructions (Signed)
RTC in 4 months

## 2011-08-26 ENCOUNTER — Encounter: Payer: Self-pay | Admitting: Family Medicine

## 2011-08-26 ENCOUNTER — Ambulatory Visit (INDEPENDENT_AMBULATORY_CARE_PROVIDER_SITE_OTHER): Payer: Medicare Other | Admitting: Family Medicine

## 2011-08-26 VITALS — BP 140/80 | Temp 98.4°F | Wt 164.0 lb

## 2011-08-26 DIAGNOSIS — C541 Malignant neoplasm of endometrium: Secondary | ICD-10-CM

## 2011-08-26 DIAGNOSIS — M25569 Pain in unspecified knee: Secondary | ICD-10-CM | POA: Diagnosis not present

## 2011-08-26 DIAGNOSIS — G4700 Insomnia, unspecified: Secondary | ICD-10-CM

## 2011-08-26 DIAGNOSIS — C549 Malignant neoplasm of corpus uteri, unspecified: Secondary | ICD-10-CM | POA: Diagnosis not present

## 2011-08-26 DIAGNOSIS — I10 Essential (primary) hypertension: Secondary | ICD-10-CM

## 2011-08-26 MED ORDER — TRAZODONE HCL 50 MG PO TABS: 50.0000 mg | ORAL_TABLET | Freq: Every day | ORAL | Status: DC

## 2011-08-26 MED ORDER — ATENOLOL 50 MG PO TABS: 50.0000 mg | ORAL_TABLET | Freq: Every day | ORAL | Status: DC

## 2011-08-26 MED ORDER — CITALOPRAM HYDROBROMIDE 20 MG PO TABS: ORAL_TABLET | ORAL | Status: DC

## 2011-08-26 MED ORDER — HYDROCHLOROTHIAZIDE 25 MG PO TABS: 25.0000 mg | ORAL_TABLET | Freq: Every day | ORAL | Status: DC

## 2011-08-26 NOTE — Patient Instructions (Signed)
Continue your current medications  Take the trazodone 50 mg at bedtime and if after one month he is still don't feel like you're sleeping well then increase it to 75 mg. If 75 mg does not work then reconsult with me

## 2011-08-26 NOTE — Progress Notes (Signed)
  Subjective:    Patient ID: [name], female    DOB: Nov 11, 1921, 78 y.o.   MRN: [id number]  HPI [name] is an 78 year old female who comes in today accompanied by her daughter for evaluation of multiple issues  She takes Tenormin 50 mg along with hydrochlorothiazide 25 mg daily for hypertension BP 140/80 pulse 70 and regular  She takes Celexa 30 mg each bedtime for mild depression and was given recently trazodone 50 mg because of insomnia. This was given to her by her cancer doctor  She had vaginal bleeding which turned out to be an endometrial cancer. She had a hysterectomy and the disease was confined to the uterus.  She is a spot on her left posterior shoulder she would like checked  She's also had pain in her left knee. She went to the orthopedist was told she had a popliteal cyst. They did a cortisone injection but now the pain and swelling of that leg is worse. Advised to go back and see the orthopedist   Review of Systems    general cardiovascular review of systems otherwise negative Objective:   Physical Exam  Well-developed well-nourished female in no acute distress examination of skin shows the lesion she is concerned about is a seborrheic keratosis.      Assessment & Plan:  Hypertension at goal continue current therapy  Seborrheic keratosis observe  Left knee pain referred back to orthopedist  Insomnia trazodone 50 mg increase by 25 mg monthly

## 2011-10-26 ENCOUNTER — Telehealth: Payer: Self-pay | Admitting: Family Medicine

## 2011-10-26 NOTE — Telephone Encounter (Signed)
Caller: [place]/Patient; Patient Name: [name]; PCP: Kelle Darting [facility]); Best Callback Phone Number: [phone]; Patient is calling in regards to Dr. Nelida Meuse instructions to try her Trazodone 50 mg nightly for sleep for 1 month.  If it is not effective, she is to call back and he would increase it to 75 mg nightly for sleep.  She says that it's not quite working.  She takes about 1.5 hours to go to sleep and when she does, she wakes up to go to the bathroom with it taking a while for her to go back to sleep.  Denies emergent symptoms.  Triaged using sleep disorders with a disposition to see provider inn two weeks due to previously evaluated for sleeping difficulties and symptoms not improving or worsening.  Per Dr.Todd's instructions patient is requesting increase in dosage of medication.  Last appointment was 08/26/11 with Dr. Tawanna Cooler where he notes to increase the medication by 25 mg in one month.  Pharmacy is [facility] at [phone].  OFFICE:  PLEASE FOLLOW UP WITH PATIENT'S REQUEST TO HAVE TRAZODONE  INCREASED  25 MG AT BEDTIME.  SHE NEEDS HER PHARMACY TO BE NOTIFIED.  THANKS.

## 2011-11-02 DIAGNOSIS — Z23 Encounter for immunization: Secondary | ICD-10-CM | POA: Diagnosis not present

## 2011-11-23 NOTE — Telephone Encounter (Signed)
Caller: [place]/Patient; Patient Name: [name]; PCP: Kelle Darting [facility]); Best Callback Phone Number: [phone].  Call regarding a script for Trazadone 75mg .  Pt called 10/26/11 in regard to increasing dose per Dr. Nelida Meuse instructions.  Pt has increased her dose to 75mg .  Asked pt if it's helping and she replied 'Some'.  Pt continues to have difficulties falling asleep and staying asleep. Pt averaging about 4 hrs of sleep nightly.   Emergent symptoms r/o per Sleep Disorder protocol w/exception to ' Need to urinated awakens individual more than once per night and not previously evaluated'.  See Provider w/in 72 hrs.  Pt says the nocturia is not keeping her from sleeping or falling asleep and she isn't concerned about that at this time.  Home care advice given.  Offered appt. Pt declined. [facility] on file.    PLEASE FOLLOW UP WITH PT / PHARMACY REGARDING SCRIPT.  THANK YOU.

## 2011-11-25 ENCOUNTER — Ambulatory Visit: Payer: Medicare Other | Attending: Gynecologic Oncology | Admitting: Gynecologic Oncology

## 2011-11-25 ENCOUNTER — Encounter: Payer: Self-pay | Admitting: Gynecologic Oncology

## 2011-11-25 VITALS — BP 130/62 | HR 80 | Temp 97.8°F | Resp 20 | Ht 63.62 in | Wt 164.0 lb

## 2011-11-25 DIAGNOSIS — C541 Malignant neoplasm of endometrium: Secondary | ICD-10-CM

## 2011-11-25 DIAGNOSIS — Z90710 Acquired absence of both cervix and uterus: Secondary | ICD-10-CM | POA: Insufficient documentation

## 2011-11-25 DIAGNOSIS — C549 Malignant neoplasm of corpus uteri, unspecified: Secondary | ICD-10-CM | POA: Diagnosis not present

## 2011-11-25 DIAGNOSIS — Z09 Encounter for follow-up examination after completed treatment for conditions other than malignant neoplasm: Secondary | ICD-10-CM | POA: Diagnosis not present

## 2011-11-25 DIAGNOSIS — I10 Essential (primary) hypertension: Secondary | ICD-10-CM | POA: Diagnosis not present

## 2011-11-25 NOTE — Progress Notes (Signed)
Consult Note: Gyn-Onc  [name] 78 y.o. female  CC:  Chief Complaint  Patient presents with  . Endo ca    Follow up    HPI: Patient is a very pleasant 77 year old gravida 1 para 1 has been menopausal since her 50s to 53s. She took Premarin for a few years and in was prescribed progesterone had some bleeding therefore she stopped all hormone replacement therapy. This is about 20 years ago. She began having some bleeding in July of 2012 at which time she had an endometrial biopsy that revealed an atrophic endometrium. She did well until she presented in April of 2013 with any bleeding. In view of the degree of bleeding was felt that a thorough evaluation of the endometrial cavity needed to be performed. She therefore underwent a hysteroscopy D&C. Hysteroscopic findings included a large amount of tissue present within the cavity that was more than switch to be expected for an 78 year old. Pathology revealed a grade 2 endometrioid carcinoma.   She underwent a robotic hysterectomy BSO and pelvic lymph node dissection on May 28 of year 2013. Operative findings included a globular uterus. Frozen section revealed deeply invasive adenocarcinoma. She bilaterally prominent pelvic lymph nodes right greater than left. Significant intra-abdominal small bowel precluded robotic para-aortic lymph node dissection.   Uterus +/- tubes/ovaries, neoplastic  - INVASIVE ENDOMETRIOID CARCINOMA, FIGO GRADE II, INVADING INTO THE OUTER HALF OF  THE MYOMETRIUM WITH ANGIOLYMPHATIC INVASION PRESENT. NO EVIDENCE OF CERVICAL  STROMAL INVOLVEMENT IDENTIFIED.  - LEIOMYOMATA.  - CERVIX: BENIGN SQUAMOUS MUCOSA AND ENDOCERVICAL MUCOSA, NO DYSPLASIA OR  MALIGNANCY.  - BILATERAL OVARIES: BENIGN OVARIAN TISSUE WITH ENDOSALPINGIOSIS, NO EVIDENCE OF  ATYPIA OR MALIGNANCY.  - BILATERAL FALLOPIAN TUBES: BENIGN FALLOPIAN TUBAL TISSUE WITH PARATUBAL CYSTS,  NO EVIDENCE OF ATYPIA OR MALIGNANCY.  2. Lymph nodes, regional resection,  left pelvic  - FIVE LYMPH NODES, NEGATIVE FOR METASTATIC CARCINOMA (0/5).  3. Lymph nodes, regional resection, right pelvic  - SIX BENIGN LYMPH NODES, NEGATIVE FOR METASTATIC CARCINOMA (0/6).   Review of Systems:  She comes in today for her first surveillance exam. She is accompanied by her daughter. She's eating quite well has gained 6 pounds since her preop visit. As normal resumption of bowel and bladder habits. She is very happy to not have any vaginal bleeding. She has no pain.  She is sleeping better than she used to. She does state that she has some occasional urge incontinence. We discussed different strategies such as clinical exercises in time for you to help with this rather than proceeding directly to any type of medication. She is more than happy to try these other strategies. She states that she is gaining weight. She admits to eating more than she ever has before and she states she's had urine output when she had her daughter. She's not as active as she should be but she states that secondary to her age and not because of any other limitations. She's also complaining of dryness on the labia minora. This has been a long-standing issue for her. She was previously used Premarin cream and let's know if she continues that again. She is complaining of her arthritis pain in her hip in her knees. She is an otherwise 10 point negative review of systems. She denies any chest pain, shortness of breath, nausea, vomiting, fevers, chills, headaches, visual changes. She denies any unexplained weight loss or weight gain. She denies any significant change in her bowel or bladder habits.   Current Meds:  Outpatient Encounter Prescriptions as of 11/25/2011  Medication Sig Dispense Refill  . aspirin 81 MG chewable tablet Chew 81 mg by mouth every morning.       Marland Kitchen atenolol (TENORMIN) 50 MG tablet Take 1 tablet (50 mg total) by mouth daily.  90 tablet  3  . carboxymethylcellulose (REFRESH TEARS) 0.5 % SOLN  Apply 1 drop to eye 2 (two) times daily.      . citalopram (CELEXA) 20 MG tablet 1-1/2 tablets at bedtime  150 tablet  3  . hydrochlorothiazide (HYDRODIURIL) 25 MG tablet Take 1 tablet (25 mg total) by mouth daily.  90 tablet  3  . traZODone (DESYREL) 50 MG tablet Take 1 tablet (50 mg total) by mouth at bedtime.  100 tablet  3  . calcium citrate (CALCITRATE - DOSED IN MG ELEMENTAL CALCIUM) 950 MG tablet Take 1 tablet by mouth every morning.      . psyllium (METAMUCIL) 58.6 % packet Take 1 packet by mouth every evening.         Allergy:  Allergies  Allergen Reactions  . Ace Inhibitors Cough    Social Hx:   History   Social History  . Marital Status: Widowed    Spouse Name: N/A    Number of Children: N/A  . Years of Education: N/A   Occupational History  . Not on file.   Social History Main Topics  . Smoking status: Never Smoker   . Smokeless tobacco: Never Used  . Alcohol Use: No  . Drug Use: No  . Sexually Active: No   Other Topics Concern  . Not on file   Social History Narrative   She is from [place].  Moved to [facility] to be nearer to daughter [name]    Past Surgical Hx:  Past Surgical History  Procedure Date  . Colonscopy 2006  . Wisdom tooth extraction   . Svd     x 1  . Eye surgery     Bilateral cataract eye surgery  . Abdominal surgery     removed fibroid and one fallopian tube removed  . Appendectomy   . Dilation and curettage of uterus   . Hysteroscopy w/d&c 04/28/2011    Procedure: DILATATION AND CURETTAGE /HYSTEROSCOPY;  Surgeon: Miguel Aschoff, MD;  Location: [facility];  Service: Gynecology;  Laterality: N/A;  . Robotic assisted lap vaginal hysterectomy 06/08/2011    Procedure: ROBOTIC ASSISTED LAPAROSCOPIC VAGINAL HYSTERECTOMY;  Surgeon: Rejeana Brock A. Duard Brady, MD;  Location: [facility];  Service: Gynecology;  Laterality: N/A;  . Lymph node dissection 06/08/2011    Procedure: LYMPH NODE DISSECTION;  Surgeon: Rejeana Brock A. Duard Brady, MD;  Location: [facility];  Service:  Gynecology;  Laterality: N/A;  Possible Lymph Nodes    Past Medical Hx:  Past Medical History  Diagnosis Date  . Hypertension   . Diverticulitis   . Depression   . Arthritis     Hands/knees/hip/lower back - no meds  . Anxiety   . Cancer     ENDOMETRIAL CANCER    Family Hx:  Family History  Problem Relation Age of Onset  . Breast cancer Sister   . Lung cancer Brother     Vitals:  Blood pressure 130/62, pulse 80, temperature 97.8 F (36.6 C), temperature source Oral, resp. rate 20, height 5' 3.62" (1.616 m), weight 164 lb (74.39 kg).  Physical Exam:  Well-nourished well-developed female in no acute distress.  Neck: Supple, no lymphadenopathy no thyromegaly.  Lungs: Clear to auscultation but he.  Cardiovascular  exam: Regular rate and rhythm.  Abdomen: Well-healed surgical incisions. No evidence of any hernias. Abdomen soft, nondistended. There is no palpable masses or hepatosplenomegaly.  Groins: No lymphadenopathy.  Extremities: No edema..  Pelvic: External genitalia atrophic. Vagina is markedly atrophic. Vaginal cuff is visualized are no visible lesions. Bimanual examination reveals no masses or nodularity. Rectal confirms Assessment/Plan: 55-year-old with a history of stage IB grade 2 endometrial carcinoma status post surgery in May 2013. She did not have para-aortic lymph nodes done. After discussion with her and her daughter she did not wish to have any CT imaging and did not wish to undergo vaginal brachytherapy. She is amenable however to followup. She is very pleased that we saw no evidence of disease today. Return to see me in 4 months.  Niall Illes A., MD 11/25/2011, 10:27 AM

## 2011-11-25 NOTE — Patient Instructions (Signed)
RTC 4 months

## 2011-11-25 NOTE — Telephone Encounter (Signed)
ok 

## 2012-03-30 ENCOUNTER — Ambulatory Visit: Payer: Medicare Other | Admitting: Gynecologic Oncology

## 2012-04-05 ENCOUNTER — Ambulatory Visit: Payer: Medicare Other | Attending: Gynecologic Oncology | Admitting: Gynecologic Oncology

## 2012-04-05 ENCOUNTER — Encounter: Payer: Self-pay | Admitting: Gynecologic Oncology

## 2012-04-05 VITALS — BP 148/72 | HR 64 | Temp 97.7°F | Resp 18 | Ht 63.62 in | Wt 165.6 lb

## 2012-04-05 DIAGNOSIS — Z90710 Acquired absence of both cervix and uterus: Secondary | ICD-10-CM | POA: Diagnosis not present

## 2012-04-05 DIAGNOSIS — N838 Other noninflammatory disorders of ovary, fallopian tube and broad ligament: Secondary | ICD-10-CM | POA: Diagnosis not present

## 2012-04-05 DIAGNOSIS — N9489 Other specified conditions associated with female genital organs and menstrual cycle: Secondary | ICD-10-CM | POA: Diagnosis not present

## 2012-04-05 DIAGNOSIS — C549 Malignant neoplasm of corpus uteri, unspecified: Secondary | ICD-10-CM | POA: Insufficient documentation

## 2012-04-05 DIAGNOSIS — Z79899 Other long term (current) drug therapy: Secondary | ICD-10-CM | POA: Insufficient documentation

## 2012-04-05 DIAGNOSIS — C541 Malignant neoplasm of endometrium: Secondary | ICD-10-CM

## 2012-04-05 NOTE — Progress Notes (Signed)
Consult Note: Gyn-Onc  [name] 79 y.o. female  CC:  Chief Complaint  Patient presents with  . Endometrial Cancer    Follow up    HPI: Patient is a very pleasant 79 year old gravida 1 para 1 has been menopausal since her 76s to 66s. She took Premarin for a few years and in was prescribed progesterone had some bleeding therefore she stopped all hormone replacement therapy. This was about 20 years ago. She began having some bleeding in July of 2012 at which time she had an endometrial biopsy that revealed an atrophic endometrium. She did well until she presented in April of 2013 with any bleeding. In view of the degree of bleeding was felt that a thorough evaluation of the endometrial cavity needed to be performed. She therefore underwent a hysteroscopy D&C. Hysteroscopic findings included a large amount of tissue present within the cavity that was more than switch to be expected for an 79 year old. Pathology revealed a grade 2 endometrioid carcinoma.   She underwent a robotic hysterectomy BSO and pelvic lymph node dissection on May 28 of year 2013. Operative findings included a globular uterus. Frozen section revealed deeply invasive adenocarcinoma. She bilaterally prominent pelvic lymph nodes right greater than left. Significant intra-abdominal small bowel precluded robotic para-aortic lymph node dissection.   Uterus +/- tubes/ovaries, neoplastic  - INVASIVE ENDOMETRIOID CARCINOMA, FIGO GRADE II, INVADING INTO THE OUTER HALF OF  THE MYOMETRIUM WITH ANGIOLYMPHATIC INVASION PRESENT. NO EVIDENCE OF CERVICAL  STROMAL INVOLVEMENT IDENTIFIED.  - LEIOMYOMATA.  - CERVIX: BENIGN SQUAMOUS MUCOSA AND ENDOCERVICAL MUCOSA, NO DYSPLASIA OR  MALIGNANCY.  - BILATERAL OVARIES: BENIGN OVARIAN TISSUE WITH ENDOSALPINGIOSIS, NO EVIDENCE OF  ATYPIA OR MALIGNANCY.  - BILATERAL FALLOPIAN TUBES: BENIGN FALLOPIAN TUBAL TISSUE WITH PARATUBAL CYSTS,  NO EVIDENCE OF ATYPIA OR MALIGNANCY.  2. Lymph nodes, regional  resection, left pelvic  - FIVE LYMPH NODES, NEGATIVE FOR METASTATIC CARCINOMA (0/5).  3. Lymph nodes, regional resection, right pelvic  - SIX BENIGN LYMPH NODES, NEGATIVE FOR METASTATIC CARCINOMA (0/6).   After discussion of pathology and possibility of requiring radiation therapy the patient opted for close followup she did not want to have any adjuvant therapy. I last saw her in November of last year which time her exam was unremarkable.  Review of Systems:  She comes in today for her surveillance exam. She is accompanied by her daughter. She's eating quite well has gained about 2 pounds since her last visit. She has normal bowel and bladder habits, but she does note that since stopping her Metamucil about 9 months ago, her bowels are not as large. She also notes that it takes a little longer to empty her bladder, but she does feel that she is able to empty completely. She is very happy to not have any vaginal bleeding. She has no pain.  She is sleeping better than she used to. She does state that she has some occasional urge incontinence.  She states that she is gaining weight. She admits to eating more than she ever has before.  She's not as active as she should be but she states that secondary to her age and not because of any other limitations.She is complaining of her arthritis pain in her hip in her knees. She takes Aleve occasionally for her knee pain it does help somewhat. She has previously seen her orthopedist for this and has received steroid injections in the knee which has not really helped her. She is an otherwise 10 point negative review of systems.  She denies any chest pain, shortness of breath, nausea, vomiting, fevers, chills, headaches, visual changes. She denies any unexplained weight loss or weight gain. She denies any significant change in her bowel or bladder habits.   Current Meds:  Outpatient Encounter Prescriptions as of 04/05/2012  Medication Sig Dispense Refill  . aspirin  81 MG chewable tablet Chew 81 mg by mouth every morning.       Marland Kitchen atenolol (TENORMIN) 50 MG tablet Take 1 tablet (50 mg total) by mouth daily.  90 tablet  3  . calcium citrate (CALCITRATE - DOSED IN MG ELEMENTAL CALCIUM) 950 MG tablet Take 1 tablet by mouth every morning.      . carboxymethylcellulose (REFRESH TEARS) 0.5 % SOLN Apply 1 drop to eye 2 (two) times daily.      . citalopram (CELEXA) 20 MG tablet 1-1/2 tablets at bedtime  150 tablet  3  . hydrochlorothiazide (HYDRODIURIL) 25 MG tablet Take 1 tablet (25 mg total) by mouth daily.  90 tablet  3  . psyllium (METAMUCIL) 58.6 % packet Take 1 packet by mouth every evening.       . traZODone (DESYREL) 50 MG tablet Take 1 tablet (50 mg total) by mouth at bedtime.  100 tablet  3   No facility-administered encounter medications on file as of 04/05/2012.    Allergy:  Allergies  Allergen Reactions  . Ace Inhibitors Cough    Social Hx:   History   Social History  . Marital Status: Widowed    Spouse Name: N/A    Number of Children: N/A  . Years of Education: N/A   Occupational History  . Not on file.   Social History Main Topics  . Smoking status: Never Smoker   . Smokeless tobacco: Never Used  . Alcohol Use: No  . Drug Use: No  . Sexually Active: No   Other Topics Concern  . Not on file   Social History Narrative   She is from [place].  Moved to [facility] to be nearer to daughter [name]    Past Surgical Hx:  Past Surgical History  Procedure Laterality Date  . Colonscopy  2006  . Wisdom tooth extraction    . Svd      x 1  . Eye surgery      Bilateral cataract eye surgery  . Abdominal surgery      removed fibroid and one fallopian tube removed  . Appendectomy    . Dilation and curettage of uterus    . Hysteroscopy w/d&c  04/28/2011    Procedure: DILATATION AND CURETTAGE /HYSTEROSCOPY;  Surgeon: Miguel Aschoff, MD;  Location: [facility];  Service: Gynecology;  Laterality: N/A;  . Robotic assisted lap vaginal  hysterectomy  06/08/2011    Procedure: ROBOTIC ASSISTED LAPAROSCOPIC VAGINAL HYSTERECTOMY;  Surgeon: Rejeana Brock A. Duard Brady, MD;  Location: [facility];  Service: Gynecology;  Laterality: N/A;  . Lymph node dissection  06/08/2011    Procedure: LYMPH NODE DISSECTION;  Surgeon: Rejeana Brock A. Duard Brady, MD;  Location: [facility];  Service: Gynecology;  Laterality: N/A;  Possible Lymph Nodes    Past Medical Hx:  Past Medical History  Diagnosis Date  . Hypertension   . Diverticulitis   . Depression   . Arthritis     Hands/knees/hip/lower back - no meds  . Anxiety   . Cancer     ENDOMETRIAL CANCER    Family Hx:  Family History  Problem Relation Age of Onset  . Breast cancer Sister   .  Lung cancer Brother     Vitals:  Blood pressure 148/72, pulse 64, temperature 97.7 F (36.5 C), temperature source Oral, resp. rate 18, height 5' 3.62" (1.616 m), weight 165 lb 9.6 oz (75.116 kg).  Physical Exam:  Well-nourished well-developed female in no acute distress.  Neck: Supple, no lymphadenopathy no thyromegaly.  Lungs: Clear to auscultation but he.  Cardiovascular exam: Regular rate and rhythm.  Abdomen: Well-healed surgical incisions. No evidence of any hernias. Abdomen soft, nondistended. There is no palpable masses or hepatosplenomegaly.  Groins: No lymphadenopathy.  Extremities: No edema..  Pelvic: External genitalia atrophic. Vagina is markedly atrophic. Vaginal cuff is visualized are no visible lesions. Bimanual examination reveals no masses or nodularity. Rectal confirms.  Assessment/Plan: 79 year old with a history of stage IB grade 2 endometrial carcinoma status post surgery in May 2013. She did not have para-aortic lymph nodes done. After discussion with her and her daughter she did not wish to have any CT imaging and did not wish to undergo vaginal brachytherapy. She is amenable however to followup. She is very pleased that we saw no evidence of disease today. Return to see me in 6 months. She will  of course call us if she has any bleeding discharge or pain in the interim.  Skiler Olden A., MD 04/05/2012, 10:31 AM

## 2012-04-05 NOTE — Patient Instructions (Signed)
Return to clinic in 6 months. Please call us if you have any vaginal bleeding, vaginal discharge, or abdominal or pelvic pain.

## 2012-06-09 ENCOUNTER — Other Ambulatory Visit: Payer: Self-pay | Admitting: *Deleted

## 2012-06-09 DIAGNOSIS — I10 Essential (primary) hypertension: Secondary | ICD-10-CM

## 2012-06-09 DIAGNOSIS — G4700 Insomnia, unspecified: Secondary | ICD-10-CM

## 2012-06-09 MED ORDER — CITALOPRAM HYDROBROMIDE 20 MG PO TABS: ORAL_TABLET | ORAL | Status: DC

## 2012-06-09 MED ORDER — ATENOLOL 50 MG PO TABS: 50.0000 mg | ORAL_TABLET | Freq: Every day | ORAL | Status: DC

## 2012-06-09 MED ORDER — HYDROCHLOROTHIAZIDE 25 MG PO TABS: 25.0000 mg | ORAL_TABLET | Freq: Every day | ORAL | Status: DC

## 2012-06-09 MED ORDER — TRAZODONE HCL 50 MG PO TABS: 50.0000 mg | ORAL_TABLET | Freq: Every day | ORAL | Status: DC

## 2012-08-16 ENCOUNTER — Other Ambulatory Visit: Payer: Self-pay | Admitting: Family Medicine

## 2012-08-17 ENCOUNTER — Telehealth: Payer: Self-pay | Admitting: *Deleted

## 2012-08-17 DIAGNOSIS — I10 Essential (primary) hypertension: Secondary | ICD-10-CM

## 2012-08-17 DIAGNOSIS — G4700 Insomnia, unspecified: Secondary | ICD-10-CM

## 2012-08-17 MED ORDER — HYDROCHLOROTHIAZIDE 25 MG PO TABS: 25.0000 mg | ORAL_TABLET | Freq: Every day | ORAL | Status: DC

## 2012-08-17 MED ORDER — TRAZODONE HCL 50 MG PO TABS: 50.0000 mg | ORAL_TABLET | Freq: Every day | ORAL | Status: DC

## 2012-08-17 NOTE — Telephone Encounter (Signed)
Her last office visit was 08/26/11 okay to refill her Trazodone 50 mg #90?

## 2012-08-17 NOTE — Telephone Encounter (Signed)
ok 

## 2012-08-17 NOTE — Telephone Encounter (Signed)
Rx has been faxed.

## 2012-08-17 NOTE — Telephone Encounter (Signed)
Patient is requesting a 90 day refill of Trazodone.   This is now concidered a controlled prescription.  Her last office visit was

## 2012-10-05 ENCOUNTER — Encounter: Payer: Self-pay | Admitting: Gynecologic Oncology

## 2012-10-05 ENCOUNTER — Ambulatory Visit: Payer: Medicare Other | Attending: Gynecologic Oncology | Admitting: Gynecologic Oncology

## 2012-10-05 VITALS — BP 134/80 | HR 60 | Temp 99.2°F | Resp 16 | Ht 63.62 in | Wt 168.4 lb

## 2012-10-05 DIAGNOSIS — Z90710 Acquired absence of both cervix and uterus: Secondary | ICD-10-CM | POA: Insufficient documentation

## 2012-10-05 DIAGNOSIS — C549 Malignant neoplasm of corpus uteri, unspecified: Secondary | ICD-10-CM | POA: Insufficient documentation

## 2012-10-05 DIAGNOSIS — N905 Atrophy of vulva: Secondary | ICD-10-CM | POA: Diagnosis not present

## 2012-10-05 DIAGNOSIS — M25559 Pain in unspecified hip: Secondary | ICD-10-CM | POA: Insufficient documentation

## 2012-10-05 DIAGNOSIS — M25569 Pain in unspecified knee: Secondary | ICD-10-CM | POA: Insufficient documentation

## 2012-10-05 DIAGNOSIS — Z79899 Other long term (current) drug therapy: Secondary | ICD-10-CM | POA: Diagnosis not present

## 2012-10-05 DIAGNOSIS — M545 Low back pain, unspecified: Secondary | ICD-10-CM | POA: Insufficient documentation

## 2012-10-05 DIAGNOSIS — Z9079 Acquired absence of other genital organ(s): Secondary | ICD-10-CM | POA: Insufficient documentation

## 2012-10-05 DIAGNOSIS — N952 Postmenopausal atrophic vaginitis: Secondary | ICD-10-CM | POA: Insufficient documentation

## 2012-10-05 DIAGNOSIS — I10 Essential (primary) hypertension: Secondary | ICD-10-CM | POA: Diagnosis not present

## 2012-10-05 DIAGNOSIS — C541 Malignant neoplasm of endometrium: Secondary | ICD-10-CM

## 2012-10-05 NOTE — Progress Notes (Signed)
Consult Note: Gyn-Onc  [name] 79 y.o. female  CC:  Chief Complaint  Patient presents with  . Endometrial cancer    Follow up    HPI: Patient is a very pleasant 79 year old gravida 1 para 1 has been menopausal since her 58s to 27s. She took Premarin for a few years and in was prescribed progesterone had some bleeding therefore she stopped all hormone replacement therapy. This was about 20 years ago. She began having some bleeding in July of 2012 at which time she had an endometrial biopsy that revealed an atrophic endometrium. She did well until she presented in April of 2013 with any bleeding. In view of the degree of bleeding was felt that a thorough evaluation of the endometrial cavity needed to be performed. She therefore underwent a hysteroscopy D&C. Hysteroscopic findings included a large amount of tissue present within the cavity that was more than switch to be expected for an 79 year old. Pathology revealed a grade 2 endometrioid carcinoma.   She underwent a robotic hysterectomy BSO and pelvic lymph node dissection on May 28 of year 2013. Operative findings included a globular uterus. Frozen section revealed deeply invasive adenocarcinoma. She bilaterally prominent pelvic lymph nodes right greater than left. Significant intra-abdominal small bowel precluded robotic para-aortic lymph node dissection.   Uterus +/- tubes/ovaries, neoplastic  - INVASIVE ENDOMETRIOID CARCINOMA, FIGO GRADE II, INVADING INTO THE OUTER HALF OF  THE MYOMETRIUM WITH ANGIOLYMPHATIC INVASION PRESENT. NO EVIDENCE OF CERVICAL  STROMAL INVOLVEMENT IDENTIFIED.  - LEIOMYOMATA.  - CERVIX: BENIGN SQUAMOUS MUCOSA AND ENDOCERVICAL MUCOSA, NO DYSPLASIA OR  MALIGNANCY.  - BILATERAL OVARIES: BENIGN OVARIAN TISSUE WITH ENDOSALPINGIOSIS, NO EVIDENCE OF  ATYPIA OR MALIGNANCY.  - BILATERAL FALLOPIAN TUBES: BENIGN FALLOPIAN TUBAL TISSUE WITH PARATUBAL CYSTS,  NO EVIDENCE OF ATYPIA OR MALIGNANCY.  2. Lymph nodes, regional  resection, left pelvic  - FIVE LYMPH NODES, NEGATIVE FOR METASTATIC CARCINOMA (0/5).  3. Lymph nodes, regional resection, right pelvic  - SIX BENIGN LYMPH NODES, NEGATIVE FOR METASTATIC CARCINOMA (0/6).   After discussion of pathology and possibility of requiring radiation therapy the patient opted for close followup she did not want to have any adjuvant therapy. I last saw her in November of last year which time her exam was unremarkable.  Interval History: She continues to have issues with her left knee. Her last injection was more than a year ago. She's also experiencing some low back pain for which she takes Aleve and occasionally provide her some relief. There are no other new medical problems in the family. The only new issue is that her sister who is 28 years old passed away this year. She had fallen at home and went to a nursing home and then had a followup nursing home with head trauma and passed away 5 days later.  Review of Systems:  She comes in today for her surveillance exam. She is accompanied by her daughter. She's eating quite well has gained about 3 pounds since her last visit. She also notes that it takes a little longer to empty her bladder, but she does feel that she is able to empty completely. She is very happy to not have any vaginal bleeding. She has no pain.  She is sleeping better than she used to. She does state that she has some occasional urge incontinence.  She states that she is gaining weight. She admits to eating more than she ever has before.  She's not as active as she should be but she states that  secondary to her age and not because of any other limitations.She is complaining of her arthritis pain in her hip in her knees. She takes Aleve occasionally for her knee pain it does help somewhat as stated above.     Constitutional: Denies fever. Skin: No rash, sores, jaundice, itching, or dryness.  Cardiovascular: No chest pain, shortness of breath, or edema   Pulmonary: No cough or wheeze.  Gastro Intestinal:  No nausea, vomiting, constipation, or diarrhea reported. No bright red blood per rectum or change in bowel movement.  Genitourinary: No frequency, urgency, or dysuria.  Denies vaginal bleeding and discharge.  Musculoskeletal: As above Neurologic: No weakness, numbness, or change in gait.  Psychology: No changes  Current Meds:  Outpatient Encounter Prescriptions as of 10/05/2012  Medication Sig Dispense Refill  . aspirin 81 MG chewable tablet Chew 81 mg by mouth every morning.       Marland Kitchen atenolol (TENORMIN) 50 MG tablet TAKE 1 TABLET EVERY DAY (NEED OFFICE VISIT FOR MORE REFILLS)  90 tablet  0  . calcium citrate (CALCITRATE - DOSED IN MG ELEMENTAL CALCIUM) 950 MG tablet Take 1 tablet by mouth every morning.      . carboxymethylcellulose (REFRESH TEARS) 0.5 % SOLN Apply 1 drop to eye 2 (two) times daily.      . citalopram (CELEXA) 20 MG tablet TAKE  1  AND  1/2  TABLETS AT BEDTIME (NEED OFFICE VISIT FOR MORE REFILLS)  135 tablet  0  . hydrochlorothiazide (HYDRODIURIL) 25 MG tablet Take 1 tablet (25 mg total) by mouth daily.  90 tablet  0  . psyllium (METAMUCIL) 58.6 % packet Take 1 packet by mouth every evening.       . traZODone (DESYREL) 50 MG tablet Take 1 tablet (50 mg total) by mouth at bedtime.  100 tablet  0   No facility-administered encounter medications on file as of 10/05/2012.    Allergy:  Allergies  Allergen Reactions  . Ace Inhibitors Cough    Social Hx:   History   Social History  . Marital Status: Widowed    Spouse Name: N/A    Number of Children: N/A  . Years of Education: N/A   Occupational History  . Not on file.   Social History Main Topics  . Smoking status: Never Smoker   . Smokeless tobacco: Never Used  . Alcohol Use: No  . Drug Use: No  . Sexual Activity: No   Other Topics Concern  . Not on file   Social History Narrative   She is from [place].  Moved to [facility] to be nearer to daughter  [name]    Past Surgical Hx:  Past Surgical History  Procedure Laterality Date  . Colonscopy  2006  . Wisdom tooth extraction    . Svd      x 1  . Eye surgery      Bilateral cataract eye surgery  . Abdominal surgery      removed fibroid and one fallopian tube removed  . Appendectomy    . Dilation and curettage of uterus    . Hysteroscopy w/d&c  04/28/2011    Procedure: DILATATION AND CURETTAGE /HYSTEROSCOPY;  Surgeon: Miguel Aschoff, MD;  Location: [facility];  Service: Gynecology;  Laterality: N/A;  . Robotic assisted lap vaginal hysterectomy  06/08/2011    Procedure: ROBOTIC ASSISTED LAPAROSCOPIC VAGINAL HYSTERECTOMY;  Surgeon: Rejeana Brock A. Duard Brady, MD;  Location: [facility];  Service: Gynecology;  Laterality: N/A;  . Lymph node dissection  06/08/2011  Procedure: LYMPH NODE DISSECTION;  Surgeon: Rejeana Brock A. Duard Brady, MD;  Location: [facility];  Service: Gynecology;  Laterality: N/A;  Possible Lymph Nodes    Past Medical Hx:  Past Medical History  Diagnosis Date  . Hypertension   . Diverticulitis   . Depression   . Arthritis     Hands/knees/hip/lower back - no meds  . Anxiety   . Cancer     ENDOMETRIAL CANCER    Family Hx:  Family History  Problem Relation Age of Onset  . Breast cancer Sister   . Lung cancer Brother     Vitals:  Blood pressure 134/80, pulse 60, temperature 99.2 F (37.3 C), temperature source Oral, resp. rate 16, height 5' 3.62" (1.616 m), weight 168 lb 6.4 oz (76.386 kg).  Physical Exam:  Well-nourished well-developed female in no acute distress.  Neck: Supple, no lymphadenopathy no thyromegaly.  Lungs: Clear to auscultation bilaterally.  Cardiovascular exam: Regular rate and rhythm.  Abdomen: Well-healed surgical incisions. No evidence of any hernias. Abdomen soft, nondistended. There is no palpable masses or hepatosplenomegaly.  Groins: No lymphadenopathy.  Extremities: No edema..  Pelvic: External genitalia atrophic. Vagina is markedly atrophic. Vaginal cuff  is visualized are no visible lesions. Bimanual examination reveals no masses or nodularity. Rectal confirms.  Assessment/Plan: 79 year old with a history of stage IB grade 2 endometrial carcinoma status post surgery in May 2013. She did not have para-aortic lymph nodes done. After discussion with her and her daughter she did not wish to have any CT imaging and did not wish to undergo vaginal brachytherapy. She is amenable however to followup.   She is very pleased that we saw no evidence of disease today. Return to see me in 6 months. She will of course call us if she has any bleeding discharge or pain in the interim.  Avrum Kimball A., MD 10/05/2012, 10:54 AM

## 2012-10-05 NOTE — Patient Instructions (Addendum)
Return to clinic in 6 months.  Please call in Dec. 2014 or Jan 2015 to schedule an appointment for March 2015.

## 2012-10-17 DIAGNOSIS — Z23 Encounter for immunization: Secondary | ICD-10-CM | POA: Diagnosis not present

## 2012-10-18 ENCOUNTER — Ambulatory Visit
Admission: RE | Admit: 2012-10-18 | Discharge: 2012-10-18 | Disposition: A | Discharge status: Home / Self Care | Payer: Medicare Other | Source: Ambulatory Visit | Attending: Family Medicine | Admitting: Family Medicine

## 2012-10-18 ENCOUNTER — Other Ambulatory Visit: Payer: Self-pay | Admitting: Family Medicine

## 2012-10-18 DIAGNOSIS — M25569 Pain in unspecified knee: Secondary | ICD-10-CM

## 2012-10-18 DIAGNOSIS — M1710 Unilateral primary osteoarthritis, unspecified knee: Secondary | ICD-10-CM | POA: Diagnosis not present

## 2012-11-02 ENCOUNTER — Ambulatory Visit (INDEPENDENT_AMBULATORY_CARE_PROVIDER_SITE_OTHER): Payer: Medicare Other | Admitting: Family Medicine

## 2012-11-02 ENCOUNTER — Encounter: Payer: Self-pay | Admitting: Family Medicine

## 2012-11-02 VITALS — BP 140/90 | Temp 97.6°F | Wt 167.0 lb

## 2012-11-02 DIAGNOSIS — I10 Essential (primary) hypertension: Secondary | ICD-10-CM

## 2012-11-02 DIAGNOSIS — M25569 Pain in unspecified knee: Secondary | ICD-10-CM

## 2012-11-02 DIAGNOSIS — M25562 Pain in left knee: Secondary | ICD-10-CM

## 2012-11-02 DIAGNOSIS — G4700 Insomnia, unspecified: Secondary | ICD-10-CM

## 2012-11-02 MED ORDER — ATENOLOL 50 MG PO TABS: ORAL_TABLET | ORAL | Status: DC

## 2012-11-02 MED ORDER — TRAZODONE HCL 50 MG PO TABS: ORAL_TABLET | ORAL | Status: DC

## 2012-11-02 MED ORDER — HYDROCHLOROTHIAZIDE 25 MG PO TABS: 25.0000 mg | ORAL_TABLET | Freq: Every day | ORAL | Status: DC

## 2012-11-02 MED ORDER — CITALOPRAM HYDROBROMIDE 20 MG PO TABS: ORAL_TABLET | ORAL | Status: DC

## 2012-11-02 NOTE — Patient Instructions (Signed)
Continue your current medications except increase the trazodone to intake a tablet and a half nightly at bedtime.: 46 weeks with a progress report. They've a voicemail with Fleet Contras  Remember to walk daily

## 2012-11-02 NOTE — Progress Notes (Signed)
  Subjective:    Patient ID: [name], female    DOB: 12-09-1921, 79 y.o.   MRN: [id number]  HPI [name] is a [age] year old female who comes in today accompanied by her daughter for general checkup  She takes Tenormin 50 mg and hydrochlorothiazide 25 mg daily for hypertension BP 140/90  She takes trazodone 50 mg each bedtime for sleep dysfunction but doesn't seem to be working. She would like to discuss options. She also takes an aspirin tablet and 30 mg of Celexa each bedtime  She gets routine eye care, dental care, BSE monthly, his early age data mammography and colonoscopy and 24. Vaccinations up-to-date seasonal flu shot this fall.  Review of systems negative except she went to see the orthopedist about pain in her left knee. He gave her cortisone injection.  Social history she lives with her daughter [name] she tries to walk every day   Review of Systems    review of systems otherwise negative Objective:   Physical Exam Well-developed well-nourished female no acute distress examination of the HEENT negative neck was supple no adenopathy thyroid normal  Cardiopulmonary exam normal breast exam normal abdominal exam normal extremities normal pulses 2+ and symmetrical no edema       Assessment & Plan:  Hypertension at goal continue current therapy  Insomnia increase trazodone  History of mild depression continue Celexa 30 mg daily  Osteoarthritis left knee followed by also

## 2012-12-13 ENCOUNTER — Telehealth: Payer: Self-pay | Admitting: Family Medicine

## 2012-12-13 DIAGNOSIS — G4700 Insomnia, unspecified: Secondary | ICD-10-CM

## 2012-12-13 MED ORDER — TRAZODONE HCL 50 MG PO TABS: 50.0000 mg | ORAL_TABLET | Freq: Two times a day (BID) | ORAL | Status: AC

## 2012-12-13 NOTE — Telephone Encounter (Signed)
Pts daughter calling to request in increase in dosage of traZODone (DESYREL) 50 MG tablet.  States Dr. Tawanna Cooler instructed pt to try taking 2 tablets and patient states this works better than taking 1.5 tablets.  Requesting update script to be faxed to [facility] on [address].

## 2012-12-13 NOTE — Telephone Encounter (Signed)
Okay per Dr Tawanna Cooler.  Rx called into [facility]

## 2013-02-05 DIAGNOSIS — L509 Urticaria, unspecified: Secondary | ICD-10-CM | POA: Diagnosis not present

## 2013-02-08 DIAGNOSIS — L509 Urticaria, unspecified: Secondary | ICD-10-CM | POA: Diagnosis not present

## 2013-08-31 DIAGNOSIS — G4700 Insomnia, unspecified: Secondary | ICD-10-CM | POA: Diagnosis not present

## 2013-08-31 DIAGNOSIS — M175 Other unilateral secondary osteoarthritis of knee: Secondary | ICD-10-CM | POA: Diagnosis not present

## 2013-08-31 DIAGNOSIS — F329 Major depressive disorder, single episode, unspecified: Secondary | ICD-10-CM | POA: Diagnosis not present

## 2013-08-31 DIAGNOSIS — I10 Essential (primary) hypertension: Secondary | ICD-10-CM | POA: Diagnosis not present

## 2013-09-04 DIAGNOSIS — F411 Generalized anxiety disorder: Secondary | ICD-10-CM | POA: Diagnosis not present

## 2013-09-04 DIAGNOSIS — I10 Essential (primary) hypertension: Secondary | ICD-10-CM | POA: Diagnosis not present

## 2013-09-04 DIAGNOSIS — R259 Unspecified abnormal involuntary movements: Secondary | ICD-10-CM | POA: Diagnosis not present

## 2013-09-04 DIAGNOSIS — F329 Major depressive disorder, single episode, unspecified: Secondary | ICD-10-CM | POA: Diagnosis not present

## 2013-09-12 ENCOUNTER — Ambulatory Visit
Admission: RE | Admit: 2013-09-12 | Discharge: 2013-09-12 | Disposition: A | Discharge status: Home / Self Care | Payer: Medicare Other | Source: Ambulatory Visit | Attending: Family Medicine | Admitting: Family Medicine

## 2013-09-12 ENCOUNTER — Other Ambulatory Visit: Payer: Self-pay | Admitting: Family Medicine

## 2013-09-12 DIAGNOSIS — F329 Major depressive disorder, single episode, unspecified: Secondary | ICD-10-CM | POA: Diagnosis not present

## 2013-09-12 DIAGNOSIS — M175 Other unilateral secondary osteoarthritis of knee: Secondary | ICD-10-CM | POA: Diagnosis not present

## 2013-09-12 DIAGNOSIS — Z23 Encounter for immunization: Secondary | ICD-10-CM | POA: Diagnosis not present

## 2013-09-12 DIAGNOSIS — M25561 Pain in right knee: Secondary | ICD-10-CM

## 2013-09-12 DIAGNOSIS — F411 Generalized anxiety disorder: Secondary | ICD-10-CM | POA: Diagnosis not present

## 2013-09-12 DIAGNOSIS — M25562 Pain in left knee: Secondary | ICD-10-CM

## 2013-09-12 DIAGNOSIS — M1710 Unilateral primary osteoarthritis, unspecified knee: Secondary | ICD-10-CM | POA: Diagnosis not present

## 2013-09-12 DIAGNOSIS — IMO0002 Reserved for concepts with insufficient information to code with codable children: Secondary | ICD-10-CM | POA: Diagnosis not present

## 2013-09-12 DIAGNOSIS — I10 Essential (primary) hypertension: Secondary | ICD-10-CM | POA: Diagnosis not present

## 2013-10-03 DIAGNOSIS — F411 Generalized anxiety disorder: Secondary | ICD-10-CM | POA: Diagnosis not present

## 2013-10-03 DIAGNOSIS — M175 Other unilateral secondary osteoarthritis of knee: Secondary | ICD-10-CM | POA: Diagnosis not present

## 2013-10-03 DIAGNOSIS — F329 Major depressive disorder, single episode, unspecified: Secondary | ICD-10-CM | POA: Diagnosis not present

## 2013-10-03 DIAGNOSIS — I10 Essential (primary) hypertension: Secondary | ICD-10-CM | POA: Diagnosis not present

## 2013-10-10 DIAGNOSIS — M175 Other unilateral secondary osteoarthritis of knee: Secondary | ICD-10-CM | POA: Diagnosis not present

## 2013-10-17 DIAGNOSIS — M1732 Unilateral post-traumatic osteoarthritis, left knee: Secondary | ICD-10-CM | POA: Diagnosis not present

## 2013-10-31 DIAGNOSIS — M175 Other unilateral secondary osteoarthritis of knee: Secondary | ICD-10-CM | POA: Diagnosis not present

## 2013-10-31 DIAGNOSIS — M25562 Pain in left knee: Secondary | ICD-10-CM | POA: Diagnosis not present

## 2013-10-31 DIAGNOSIS — M170 Bilateral primary osteoarthritis of knee: Secondary | ICD-10-CM | POA: Diagnosis not present

## 2013-11-06 DIAGNOSIS — M1732 Unilateral post-traumatic osteoarthritis, left knee: Secondary | ICD-10-CM | POA: Diagnosis not present

## 2014-01-30 DIAGNOSIS — F411 Generalized anxiety disorder: Secondary | ICD-10-CM | POA: Diagnosis not present

## 2014-01-30 DIAGNOSIS — I10 Essential (primary) hypertension: Secondary | ICD-10-CM | POA: Diagnosis not present

## 2014-01-30 DIAGNOSIS — M1732 Unilateral post-traumatic osteoarthritis, left knee: Secondary | ICD-10-CM | POA: Diagnosis not present

## 2014-01-30 DIAGNOSIS — F329 Major depressive disorder, single episode, unspecified: Secondary | ICD-10-CM | POA: Diagnosis not present

## 2014-06-17 DIAGNOSIS — M170 Bilateral primary osteoarthritis of knee: Secondary | ICD-10-CM | POA: Diagnosis not present

## 2014-06-17 DIAGNOSIS — F329 Major depressive disorder, single episode, unspecified: Secondary | ICD-10-CM | POA: Diagnosis not present

## 2014-06-17 DIAGNOSIS — F411 Generalized anxiety disorder: Secondary | ICD-10-CM | POA: Diagnosis not present

## 2014-06-17 DIAGNOSIS — I10 Essential (primary) hypertension: Secondary | ICD-10-CM | POA: Diagnosis not present

## 2014-06-27 DIAGNOSIS — M1712 Unilateral primary osteoarthritis, left knee: Secondary | ICD-10-CM | POA: Diagnosis not present

## 2014-06-27 DIAGNOSIS — M222X2 Patellofemoral disorders, left knee: Secondary | ICD-10-CM | POA: Diagnosis not present

## 2014-06-27 DIAGNOSIS — F411 Generalized anxiety disorder: Secondary | ICD-10-CM | POA: Diagnosis not present

## 2014-06-27 DIAGNOSIS — F329 Major depressive disorder, single episode, unspecified: Secondary | ICD-10-CM | POA: Diagnosis not present

## 2014-07-04 DIAGNOSIS — M1712 Unilateral primary osteoarthritis, left knee: Secondary | ICD-10-CM | POA: Diagnosis not present

## 2014-07-11 DIAGNOSIS — F329 Major depressive disorder, single episode, unspecified: Secondary | ICD-10-CM | POA: Diagnosis not present

## 2014-07-11 DIAGNOSIS — F411 Generalized anxiety disorder: Secondary | ICD-10-CM | POA: Diagnosis not present

## 2014-07-11 DIAGNOSIS — C4491 Basal cell carcinoma of skin, unspecified: Secondary | ICD-10-CM | POA: Diagnosis not present

## 2014-07-11 DIAGNOSIS — M1712 Unilateral primary osteoarthritis, left knee: Secondary | ICD-10-CM | POA: Diagnosis not present

## 2014-07-19 DIAGNOSIS — F411 Generalized anxiety disorder: Secondary | ICD-10-CM | POA: Diagnosis not present

## 2014-07-19 DIAGNOSIS — M170 Bilateral primary osteoarthritis of knee: Secondary | ICD-10-CM | POA: Diagnosis not present

## 2014-07-19 DIAGNOSIS — M1712 Unilateral primary osteoarthritis, left knee: Secondary | ICD-10-CM | POA: Diagnosis not present

## 2014-07-19 DIAGNOSIS — K5900 Constipation, unspecified: Secondary | ICD-10-CM | POA: Diagnosis not present

## 2014-07-24 DIAGNOSIS — L570 Actinic keratosis: Secondary | ICD-10-CM | POA: Diagnosis not present

## 2014-07-24 DIAGNOSIS — D485 Neoplasm of uncertain behavior of skin: Secondary | ICD-10-CM | POA: Diagnosis not present

## 2014-07-24 DIAGNOSIS — L821 Other seborrheic keratosis: Secondary | ICD-10-CM | POA: Diagnosis not present

## 2014-07-25 DIAGNOSIS — M1712 Unilateral primary osteoarthritis, left knee: Secondary | ICD-10-CM | POA: Diagnosis not present

## 2014-09-05 DIAGNOSIS — G4700 Insomnia, unspecified: Secondary | ICD-10-CM | POA: Diagnosis not present

## 2014-09-05 DIAGNOSIS — F411 Generalized anxiety disorder: Secondary | ICD-10-CM | POA: Diagnosis not present

## 2014-09-05 DIAGNOSIS — M1712 Unilateral primary osteoarthritis, left knee: Secondary | ICD-10-CM | POA: Diagnosis not present

## 2014-09-05 DIAGNOSIS — I10 Essential (primary) hypertension: Secondary | ICD-10-CM | POA: Diagnosis not present

## 2014-09-19 DIAGNOSIS — F329 Major depressive disorder, single episode, unspecified: Secondary | ICD-10-CM | POA: Diagnosis not present

## 2014-09-19 DIAGNOSIS — M1712 Unilateral primary osteoarthritis, left knee: Secondary | ICD-10-CM | POA: Diagnosis not present

## 2014-09-26 DIAGNOSIS — M1712 Unilateral primary osteoarthritis, left knee: Secondary | ICD-10-CM | POA: Diagnosis not present

## 2014-10-03 DIAGNOSIS — F411 Generalized anxiety disorder: Secondary | ICD-10-CM | POA: Diagnosis not present

## 2014-10-03 DIAGNOSIS — M1712 Unilateral primary osteoarthritis, left knee: Secondary | ICD-10-CM | POA: Diagnosis not present

## 2014-10-03 DIAGNOSIS — F329 Major depressive disorder, single episode, unspecified: Secondary | ICD-10-CM | POA: Diagnosis not present

## 2014-10-03 DIAGNOSIS — G4700 Insomnia, unspecified: Secondary | ICD-10-CM | POA: Diagnosis not present

## 2014-10-10 DIAGNOSIS — M170 Bilateral primary osteoarthritis of knee: Secondary | ICD-10-CM | POA: Diagnosis not present

## 2014-10-17 DIAGNOSIS — G4700 Insomnia, unspecified: Secondary | ICD-10-CM | POA: Diagnosis not present

## 2014-10-17 DIAGNOSIS — Z23 Encounter for immunization: Secondary | ICD-10-CM | POA: Diagnosis not present

## 2014-10-17 DIAGNOSIS — M1712 Unilateral primary osteoarthritis, left knee: Secondary | ICD-10-CM | POA: Diagnosis not present

## 2014-11-18 DIAGNOSIS — R7989 Other specified abnormal findings of blood chemistry: Secondary | ICD-10-CM | POA: Diagnosis not present

## 2014-11-18 DIAGNOSIS — E559 Vitamin D deficiency, unspecified: Secondary | ICD-10-CM | POA: Diagnosis not present

## 2014-11-18 DIAGNOSIS — E079 Disorder of thyroid, unspecified: Secondary | ICD-10-CM | POA: Diagnosis not present

## 2014-11-18 DIAGNOSIS — Z0000 Encounter for general adult medical examination without abnormal findings: Secondary | ICD-10-CM | POA: Diagnosis not present

## 2014-11-18 DIAGNOSIS — I10 Essential (primary) hypertension: Secondary | ICD-10-CM | POA: Diagnosis not present

## 2014-11-19 DIAGNOSIS — R7989 Other specified abnormal findings of blood chemistry: Secondary | ICD-10-CM | POA: Diagnosis not present

## 2014-11-19 DIAGNOSIS — F329 Major depressive disorder, single episode, unspecified: Secondary | ICD-10-CM | POA: Diagnosis not present

## 2014-11-19 DIAGNOSIS — I10 Essential (primary) hypertension: Secondary | ICD-10-CM | POA: Diagnosis not present

## 2014-11-19 DIAGNOSIS — Z0000 Encounter for general adult medical examination without abnormal findings: Secondary | ICD-10-CM | POA: Diagnosis not present

## 2014-12-18 DIAGNOSIS — R7989 Other specified abnormal findings of blood chemistry: Secondary | ICD-10-CM | POA: Diagnosis not present

## 2014-12-18 DIAGNOSIS — E079 Disorder of thyroid, unspecified: Secondary | ICD-10-CM | POA: Diagnosis not present

## 2014-12-20 ENCOUNTER — Other Ambulatory Visit: Payer: Self-pay | Admitting: Family Medicine

## 2014-12-20 DIAGNOSIS — M25562 Pain in left knee: Secondary | ICD-10-CM

## 2014-12-20 DIAGNOSIS — G4700 Insomnia, unspecified: Secondary | ICD-10-CM | POA: Diagnosis not present

## 2014-12-20 DIAGNOSIS — M170 Bilateral primary osteoarthritis of knee: Secondary | ICD-10-CM | POA: Diagnosis not present

## 2014-12-20 DIAGNOSIS — E079 Disorder of thyroid, unspecified: Secondary | ICD-10-CM | POA: Diagnosis not present

## 2014-12-20 DIAGNOSIS — I10 Essential (primary) hypertension: Secondary | ICD-10-CM | POA: Diagnosis not present

## 2014-12-30 DIAGNOSIS — I951 Orthostatic hypotension: Secondary | ICD-10-CM | POA: Diagnosis not present

## 2014-12-30 DIAGNOSIS — K529 Noninfective gastroenteritis and colitis, unspecified: Secondary | ICD-10-CM | POA: Diagnosis not present

## 2014-12-30 DIAGNOSIS — R197 Diarrhea, unspecified: Secondary | ICD-10-CM | POA: Diagnosis not present

## 2015-01-30 DIAGNOSIS — M1712 Unilateral primary osteoarthritis, left knee: Secondary | ICD-10-CM | POA: Diagnosis not present

## 2015-01-30 DIAGNOSIS — F411 Generalized anxiety disorder: Secondary | ICD-10-CM | POA: Diagnosis not present

## 2015-01-30 DIAGNOSIS — G4700 Insomnia, unspecified: Secondary | ICD-10-CM | POA: Diagnosis not present

## 2015-02-06 DIAGNOSIS — G4700 Insomnia, unspecified: Secondary | ICD-10-CM | POA: Diagnosis not present

## 2015-02-06 DIAGNOSIS — B009 Herpesviral infection, unspecified: Secondary | ICD-10-CM | POA: Diagnosis not present

## 2015-02-06 DIAGNOSIS — M170 Bilateral primary osteoarthritis of knee: Secondary | ICD-10-CM | POA: Diagnosis not present

## 2015-02-13 DIAGNOSIS — M1712 Unilateral primary osteoarthritis, left knee: Secondary | ICD-10-CM | POA: Diagnosis not present

## 2015-02-21 DIAGNOSIS — M1712 Unilateral primary osteoarthritis, left knee: Secondary | ICD-10-CM | POA: Diagnosis not present

## 2015-02-24 DIAGNOSIS — C4441 Basal cell carcinoma of skin of scalp and neck: Secondary | ICD-10-CM | POA: Diagnosis not present

## 2015-02-24 DIAGNOSIS — Z411 Encounter for cosmetic surgery: Secondary | ICD-10-CM | POA: Diagnosis not present

## 2015-02-24 DIAGNOSIS — D485 Neoplasm of uncertain behavior of skin: Secondary | ICD-10-CM | POA: Diagnosis not present

## 2015-02-24 DIAGNOSIS — L821 Other seborrheic keratosis: Secondary | ICD-10-CM | POA: Diagnosis not present

## 2015-02-27 DIAGNOSIS — M170 Bilateral primary osteoarthritis of knee: Secondary | ICD-10-CM | POA: Diagnosis not present

## 2015-03-04 DIAGNOSIS — M1712 Unilateral primary osteoarthritis, left knee: Secondary | ICD-10-CM | POA: Diagnosis not present

## 2015-03-04 DIAGNOSIS — K529 Noninfective gastroenteritis and colitis, unspecified: Secondary | ICD-10-CM | POA: Diagnosis not present

## 2015-03-04 DIAGNOSIS — F411 Generalized anxiety disorder: Secondary | ICD-10-CM | POA: Diagnosis not present

## 2015-03-18 DIAGNOSIS — M1712 Unilateral primary osteoarthritis, left knee: Secondary | ICD-10-CM | POA: Diagnosis not present

## 2015-03-28 DIAGNOSIS — R197 Diarrhea, unspecified: Secondary | ICD-10-CM | POA: Diagnosis not present

## 2015-03-28 DIAGNOSIS — J811 Chronic pulmonary edema: Secondary | ICD-10-CM | POA: Diagnosis not present

## 2015-03-28 DIAGNOSIS — M19049 Primary osteoarthritis, unspecified hand: Secondary | ICD-10-CM | POA: Diagnosis not present

## 2015-03-28 DIAGNOSIS — R05 Cough: Secondary | ICD-10-CM | POA: Diagnosis not present

## 2015-03-28 DIAGNOSIS — M1712 Unilateral primary osteoarthritis, left knee: Secondary | ICD-10-CM | POA: Diagnosis not present

## 2015-03-28 DIAGNOSIS — Z20828 Contact with and (suspected) exposure to other viral communicable diseases: Secondary | ICD-10-CM | POA: Diagnosis not present

## 2015-03-31 DIAGNOSIS — R197 Diarrhea, unspecified: Secondary | ICD-10-CM | POA: Diagnosis not present

## 2015-04-04 DIAGNOSIS — M1712 Unilateral primary osteoarthritis, left knee: Secondary | ICD-10-CM | POA: Diagnosis not present

## 2015-04-04 DIAGNOSIS — R05 Cough: Secondary | ICD-10-CM | POA: Diagnosis not present

## 2015-04-04 DIAGNOSIS — J069 Acute upper respiratory infection, unspecified: Secondary | ICD-10-CM | POA: Diagnosis not present

## 2015-04-04 DIAGNOSIS — R040 Epistaxis: Secondary | ICD-10-CM | POA: Diagnosis not present

## 2015-04-04 DIAGNOSIS — M170 Bilateral primary osteoarthritis of knee: Secondary | ICD-10-CM | POA: Diagnosis not present

## 2015-04-17 DIAGNOSIS — C4491 Basal cell carcinoma of skin, unspecified: Secondary | ICD-10-CM

## 2015-04-17 HISTORY — DX: Basal cell carcinoma of skin, unspecified: C44.91

## 2015-05-07 DIAGNOSIS — N6042 Mammary duct ectasia of left breast: Secondary | ICD-10-CM | POA: Diagnosis not present

## 2015-05-07 DIAGNOSIS — D485 Neoplasm of uncertain behavior of skin: Secondary | ICD-10-CM | POA: Diagnosis not present

## 2015-05-07 DIAGNOSIS — C4441 Basal cell carcinoma of skin of scalp and neck: Secondary | ICD-10-CM | POA: Diagnosis not present

## 2015-05-13 DIAGNOSIS — R197 Diarrhea, unspecified: Secondary | ICD-10-CM | POA: Diagnosis not present

## 2015-05-14 DIAGNOSIS — E079 Disorder of thyroid, unspecified: Secondary | ICD-10-CM | POA: Diagnosis not present

## 2015-05-16 DIAGNOSIS — E079 Disorder of thyroid, unspecified: Secondary | ICD-10-CM | POA: Diagnosis not present

## 2015-05-16 DIAGNOSIS — I10 Essential (primary) hypertension: Secondary | ICD-10-CM | POA: Diagnosis not present

## 2015-05-16 DIAGNOSIS — F411 Generalized anxiety disorder: Secondary | ICD-10-CM | POA: Diagnosis not present

## 2015-05-16 DIAGNOSIS — M170 Bilateral primary osteoarthritis of knee: Secondary | ICD-10-CM | POA: Diagnosis not present

## 2015-05-19 DIAGNOSIS — R197 Diarrhea, unspecified: Secondary | ICD-10-CM | POA: Diagnosis not present

## 2015-06-06 DIAGNOSIS — K5900 Constipation, unspecified: Secondary | ICD-10-CM | POA: Diagnosis not present

## 2015-06-06 DIAGNOSIS — I10 Essential (primary) hypertension: Secondary | ICD-10-CM | POA: Diagnosis not present

## 2015-06-06 DIAGNOSIS — M170 Bilateral primary osteoarthritis of knee: Secondary | ICD-10-CM | POA: Diagnosis not present

## 2015-06-27 DIAGNOSIS — R197 Diarrhea, unspecified: Secondary | ICD-10-CM | POA: Diagnosis not present

## 2015-07-21 ENCOUNTER — Other Ambulatory Visit: Payer: Self-pay | Admitting: Family Medicine

## 2015-07-21 ENCOUNTER — Ambulatory Visit
Admission: RE | Admit: 2015-07-21 | Discharge: 2015-07-21 | Disposition: A | Discharge status: Home / Self Care | Payer: Medicare Other | Source: Ambulatory Visit | Attending: Family Medicine | Admitting: Family Medicine

## 2015-07-21 DIAGNOSIS — M545 Low back pain: Secondary | ICD-10-CM | POA: Diagnosis not present

## 2015-07-21 DIAGNOSIS — M810 Age-related osteoporosis without current pathological fracture: Secondary | ICD-10-CM

## 2015-07-21 DIAGNOSIS — M549 Dorsalgia, unspecified: Secondary | ICD-10-CM

## 2015-07-21 DIAGNOSIS — M5136 Other intervertebral disc degeneration, lumbar region: Secondary | ICD-10-CM | POA: Diagnosis not present

## 2015-07-23 ENCOUNTER — Observation Stay (HOSPITAL_COMMUNITY)
Admission: EM | Admit: 2015-07-23 | Discharge: 2015-07-24 | Disposition: A | Discharge status: Home / Self Care | Payer: Medicare Other | Attending: Internal Medicine | Admitting: Internal Medicine

## 2015-07-23 ENCOUNTER — Encounter (HOSPITAL_COMMUNITY): Payer: Self-pay

## 2015-07-23 ENCOUNTER — Emergency Department (HOSPITAL_COMMUNITY): Payer: Medicare Other

## 2015-07-23 DIAGNOSIS — S22080A Wedge compression fracture of T11-T12 vertebra, initial encounter for closed fracture: Secondary | ICD-10-CM

## 2015-07-23 DIAGNOSIS — E876 Hypokalemia: Secondary | ICD-10-CM | POA: Diagnosis not present

## 2015-07-23 DIAGNOSIS — I10 Essential (primary) hypertension: Secondary | ICD-10-CM | POA: Diagnosis not present

## 2015-07-23 DIAGNOSIS — E871 Hypo-osmolality and hyponatremia: Secondary | ICD-10-CM | POA: Diagnosis not present

## 2015-07-23 DIAGNOSIS — Z79899 Other long term (current) drug therapy: Secondary | ICD-10-CM | POA: Diagnosis not present

## 2015-07-23 DIAGNOSIS — R197 Diarrhea, unspecified: Secondary | ICD-10-CM

## 2015-07-23 DIAGNOSIS — N390 Urinary tract infection, site not specified: Secondary | ICD-10-CM | POA: Diagnosis present

## 2015-07-23 DIAGNOSIS — Z8542 Personal history of malignant neoplasm of other parts of uterus: Secondary | ICD-10-CM | POA: Insufficient documentation

## 2015-07-23 DIAGNOSIS — F32A Depression, unspecified: Secondary | ICD-10-CM | POA: Diagnosis present

## 2015-07-23 DIAGNOSIS — Z7982 Long term (current) use of aspirin: Secondary | ICD-10-CM | POA: Insufficient documentation

## 2015-07-23 DIAGNOSIS — M4854XA Collapsed vertebra, not elsewhere classified, thoracic region, initial encounter for fracture: Principal | ICD-10-CM | POA: Insufficient documentation

## 2015-07-23 DIAGNOSIS — M4806 Spinal stenosis, lumbar region: Secondary | ICD-10-CM | POA: Diagnosis not present

## 2015-07-23 DIAGNOSIS — R109 Unspecified abdominal pain: Secondary | ICD-10-CM | POA: Diagnosis not present

## 2015-07-23 DIAGNOSIS — M159 Polyosteoarthritis, unspecified: Secondary | ICD-10-CM | POA: Diagnosis not present

## 2015-07-23 DIAGNOSIS — M549 Dorsalgia, unspecified: Secondary | ICD-10-CM | POA: Diagnosis present

## 2015-07-23 DIAGNOSIS — S22000A Wedge compression fracture of unspecified thoracic vertebra, initial encounter for closed fracture: Secondary | ICD-10-CM | POA: Diagnosis present

## 2015-07-23 DIAGNOSIS — K449 Diaphragmatic hernia without obstruction or gangrene: Secondary | ICD-10-CM | POA: Diagnosis not present

## 2015-07-23 DIAGNOSIS — M5126 Other intervertebral disc displacement, lumbar region: Secondary | ICD-10-CM | POA: Diagnosis not present

## 2015-07-23 DIAGNOSIS — M479 Spondylosis, unspecified: Secondary | ICD-10-CM | POA: Insufficient documentation

## 2015-07-23 DIAGNOSIS — X58XXXA Exposure to other specified factors, initial encounter: Secondary | ICD-10-CM | POA: Insufficient documentation

## 2015-07-23 DIAGNOSIS — F329 Major depressive disorder, single episode, unspecified: Secondary | ICD-10-CM | POA: Insufficient documentation

## 2015-07-23 DIAGNOSIS — R112 Nausea with vomiting, unspecified: Secondary | ICD-10-CM

## 2015-07-23 DIAGNOSIS — M25552 Pain in left hip: Secondary | ICD-10-CM

## 2015-07-23 DIAGNOSIS — G4700 Insomnia, unspecified: Secondary | ICD-10-CM | POA: Diagnosis present

## 2015-07-23 LAB — CBC WITH DIFFERENTIAL/PLATELET
BASOS ABS: 0 10*3/uL (ref 0.0–0.1)
BASOS PCT: 0 %
EOS ABS: 0 10*3/uL (ref 0.0–0.7)
EOS PCT: 0 %
HCT: 36.3 % (ref 36.0–46.0)
Hemoglobin: 12.6 g/dL (ref 12.0–15.0)
Lymphocytes Relative: 14 %
Lymphs Abs: 2 10*3/uL (ref 0.7–4.0)
MCH: 29.9 pg (ref 26.0–34.0)
MCHC: 34.7 g/dL (ref 30.0–36.0)
MCV: 86 fL (ref 78.0–100.0)
Monocytes Absolute: 2.3 10*3/uL — ABNORMAL HIGH (ref 0.1–1.0)
Monocytes Relative: 16 %
Neutro Abs: 9.9 10*3/uL — ABNORMAL HIGH (ref 1.7–7.7)
Neutrophils Relative %: 70 %
PLATELETS: 309 10*3/uL (ref 150–400)
RBC: 4.22 MIL/uL (ref 3.87–5.11)
RDW: 13.4 % (ref 11.5–15.5)
WBC: 14.2 10*3/uL — AB (ref 4.0–10.5)

## 2015-07-23 LAB — COMPREHENSIVE METABOLIC PANEL
ALT: 15 U/L (ref 14–54)
AST: 22 U/L (ref 15–41)
Albumin: 4.7 g/dL (ref 3.5–5.0)
Alkaline Phosphatase: 103 U/L (ref 38–126)
Anion gap: 12 (ref 5–15)
BUN: 16 mg/dL (ref 6–20)
CHLORIDE: 85 mmol/L — AB (ref 101–111)
CO2: 25 mmol/L (ref 22–32)
CREATININE: 0.77 mg/dL (ref 0.44–1.00)
Calcium: 9.6 mg/dL (ref 8.9–10.3)
GFR calc Af Amer: >60 mL/min (ref >60)
Glucose, Bld: 137 mg/dL — ABNORMAL HIGH (ref 65–99)
Potassium: 3.3 mmol/L — ABNORMAL LOW (ref 3.5–5.1)
Sodium: 122 mmol/L — ABNORMAL LOW (ref 135–145)
Total Bilirubin: 1.1 mg/dL (ref 0.3–1.2)
Total Protein: 8 g/dL (ref 6.5–8.1)

## 2015-07-23 LAB — URINALYSIS, ROUTINE W REFLEX MICROSCOPIC
Bilirubin Urine: NEGATIVE
GLUCOSE, UA: NEGATIVE mg/dL
HGB URINE DIPSTICK: NEGATIVE
KETONES UR: 15 mg/dL — AB
Leukocytes, UA: NEGATIVE
Nitrite: NEGATIVE
PROTEIN: NEGATIVE mg/dL
Specific Gravity, Urine: 1.036 — ABNORMAL HIGH (ref 1.005–1.030)
pH: 7 (ref 5.0–8.0)

## 2015-07-23 LAB — CBC
HCT: 42.9 % (ref 36.0–46.0)
Hemoglobin: 15 g/dL (ref 12.0–15.0)
MCH: 30.4 pg (ref 26.0–34.0)
MCHC: 35 g/dL (ref 30.0–36.0)
MCV: 87 fL (ref 78.0–100.0)
PLATELETS: 348 10*3/uL (ref 150–400)
RBC: 4.93 MIL/uL (ref 3.87–5.11)
RDW: 13.2 % (ref 11.5–15.5)
WBC: 16.3 10*3/uL — ABNORMAL HIGH (ref 4.0–10.5)

## 2015-07-23 LAB — MAGNESIUM: Magnesium: 1.6 mg/dL — ABNORMAL LOW (ref 1.7–2.4)

## 2015-07-23 LAB — LIPASE, BLOOD: LIPASE: 19 U/L (ref 11–51)

## 2015-07-23 LAB — I-STAT CG4 LACTIC ACID, ED: Lactic Acid, Venous: 0.89 mmol/L (ref 0.5–1.9)

## 2015-07-23 MED ORDER — ONDANSETRON 4 MG PO TBDP
4.0000 mg | ORAL_TABLET | Freq: Once | ORAL | Status: AC | PRN
  Administered 2015-07-23: 4 mg via ORAL
  Filled 2015-07-23: qty 1

## 2015-07-23 MED ORDER — SODIUM CHLORIDE 0.9 % IV BOLUS (SEPSIS)
500.0000 mL | Freq: Once | INTRAVENOUS | Status: AC
  Administered 2015-07-23: 500 mL via INTRAVENOUS

## 2015-07-23 MED ORDER — DULOXETINE HCL 60 MG PO CPEP
60.0000 mg | ORAL_CAPSULE | Freq: Every day | ORAL | Status: DC
  Administered 2015-07-24: 60 mg via ORAL
  Filled 2015-07-23 (×2): qty 1

## 2015-07-23 MED ORDER — POTASSIUM CHLORIDE IN NACL 20-0.9 MEQ/L-% IV SOLN
INTRAVENOUS | Status: DC
  Administered 2015-07-23: via INTRAVENOUS
  Filled 2015-07-23 (×2): qty 1000

## 2015-07-23 MED ORDER — POTASSIUM CHLORIDE CRYS ER 20 MEQ PO TBCR
40.0000 meq | EXTENDED_RELEASE_TABLET | Freq: Once | ORAL | Status: AC
  Administered 2015-07-23: 40 meq via ORAL
  Filled 2015-07-23: qty 2

## 2015-07-23 MED ORDER — LOSARTAN POTASSIUM 50 MG PO TABS
50.0000 mg | ORAL_TABLET | Freq: Every day | ORAL | Status: DC
  Administered 2015-07-24: 50 mg via ORAL
  Filled 2015-07-23: qty 1

## 2015-07-23 MED ORDER — POLYVINYL ALCOHOL 1.4 % OP SOLN: 1.0000 [drp] | Freq: Every day | OPHTHALMIC | Status: DC | PRN

## 2015-07-23 MED ORDER — TRAZODONE HCL 50 MG PO TABS
75.0000 mg | ORAL_TABLET | Freq: Every day | ORAL | Status: DC
  Administered 2015-07-23: 75 mg via ORAL
  Filled 2015-07-23: qty 2

## 2015-07-23 MED ORDER — ENOXAPARIN SODIUM 40 MG/0.4ML ~~LOC~~ SOLN
40.0000 mg | Freq: Every day | SUBCUTANEOUS | Status: DC
  Administered 2015-07-23: 40 mg via SUBCUTANEOUS
  Filled 2015-07-23: qty 0.4

## 2015-07-23 MED ORDER — ATENOLOL 50 MG PO TABS
50.0000 mg | ORAL_TABLET | Freq: Every day | ORAL | Status: DC
  Administered 2015-07-24: 50 mg via ORAL
  Filled 2015-07-23: qty 1

## 2015-07-23 MED ORDER — CALCITONIN (SALMON) 200 UNIT/ACT NA SOLN
1.0000 | Freq: Every day | NASAL | Status: DC
  Administered 2015-07-24: 1 via NASAL
  Filled 2015-07-23: qty 3.7

## 2015-07-23 MED ORDER — SODIUM CHLORIDE 0.9% FLUSH
3.0000 mL | Freq: Two times a day (BID) | INTRAVENOUS | Status: DC
  Administered 2015-07-23 – 2015-07-24 (×2): 3 mL via INTRAVENOUS

## 2015-07-23 MED ORDER — MAGNESIUM SULFATE 2 GM/50ML IV SOLN
2.0000 g | Freq: Once | INTRAVENOUS | Status: AC
  Administered 2015-07-23: 2 g via INTRAVENOUS
  Filled 2015-07-23: qty 50

## 2015-07-23 MED ORDER — MORPHINE SULFATE (PF) 2 MG/ML IV SOLN
2.0000 mg | Freq: Once | INTRAVENOUS | Status: AC
  Administered 2015-07-23: 2 mg via INTRAVENOUS
  Filled 2015-07-23: qty 1

## 2015-07-23 MED ORDER — ASPIRIN 81 MG PO CHEW
81.0000 mg | CHEWABLE_TABLET | Freq: Every morning | ORAL | Status: DC
  Administered 2015-07-24: 81 mg via ORAL
  Filled 2015-07-23: qty 1

## 2015-07-23 MED ORDER — ONDANSETRON HCL 4 MG/2ML IJ SOLN: 4.0000 mg | Freq: Four times a day (QID) | INTRAMUSCULAR | Status: DC | PRN

## 2015-07-23 MED ORDER — MORPHINE SULFATE (PF) 2 MG/ML IV SOLN
2.0000 mg | INTRAVENOUS | Status: DC | PRN
  Administered 2015-07-23: 2 mg via INTRAVENOUS
  Filled 2015-07-23: qty 1

## 2015-07-23 MED ORDER — ONDANSETRON HCL 4 MG/2ML IJ SOLN
4.0000 mg | Freq: Once | INTRAMUSCULAR | Status: AC
  Administered 2015-07-23: 4 mg via INTRAVENOUS
  Filled 2015-07-23: qty 2

## 2015-07-23 MED ORDER — MAGNESIUM OXIDE 400 (241.3 MG) MG PO TABS
200.0000 mg | ORAL_TABLET | Freq: Once | ORAL | Status: AC
  Administered 2015-07-23: 200 mg via ORAL
  Filled 2015-07-23: qty 0.5

## 2015-07-23 MED ORDER — IOPAMIDOL (ISOVUE-300) INJECTION 61%
100.0000 mL | Freq: Once | INTRAVENOUS | Status: AC | PRN
  Administered 2015-07-23: 100 mL via INTRAVENOUS

## 2015-07-23 NOTE — ED Provider Notes (Signed)
CSN: [id number]     Arrival date & time 07/23/15  1438 History   First MD Initiated Contact with Patient 07/23/15 1750     Chief Complaint  Patient presents with  . Abdominal Pain  . Back Pain  . Emesis     (Consider location/radiation/quality/duration/timing/severity/associated sxs/prior Treatment) HPI  Blood pressure 179/95, pulse 77, temperature 98.2 F (36.8 C), temperature source Oral, resp. rate 16, SpO2 99 %.  [name] is a 82 y.o. female complaining of low back pain and left lower quadrant pain onset 2 days ago with 2 episodes of nausea and vomiting today. Pain was severe, not alleviated with Aleve and acetaminophen. She was seen by her primary care and had normal blood work and lumbar x-ray, patient denies fever, chills, chest pain, shortness of breath, dysuria, hematuria. She was incontinent to stool after an episode of dry heaving while in the waiting room. Is followed by GI and has tested negative for C. difficile multiple times. No recent antibiotics, no urinary symptoms.   Past Medical History  Diagnosis Date  . Hypertension   . Diverticulitis   . Depression   . Arthritis     Hands/knees/hip/lower back - no meds  . Anxiety   . Cancer [facility])     ENDOMETRIAL CANCER   Past Surgical History  Procedure Laterality Date  . Colonscopy  2006  . Wisdom tooth extraction    . Svd      x 1  . Eye surgery      Bilateral cataract eye surgery  . Abdominal surgery      removed fibroid and one fallopian tube removed  . Appendectomy    . Dilation and curettage of uterus    . Hysteroscopy w/d&c  04/28/2011    Procedure: DILATATION AND CURETTAGE /HYSTEROSCOPY;  Surgeon: Gus Height, MD;  Location: [facility];  Service: Gynecology;  Laterality: N/A;  . Robotic assisted lap vaginal hysterectomy  06/08/2011    Procedure: ROBOTIC ASSISTED LAPAROSCOPIC VAGINAL HYSTERECTOMY;  Surgeon: Imagene Gurney A. Alycia Rossetti, MD;  Location: [facility];  Service: Gynecology;  Laterality: N/A;  . Lymph node dissection   06/08/2011    Procedure: LYMPH NODE DISSECTION;  Surgeon: Imagene Gurney A. Alycia Rossetti, MD;  Location: [facility];  Service: Gynecology;  Laterality: N/A;  Possible Lymph Nodes  . Abdominal hysterectomy     Family History  Problem Relation Age of Onset  . Breast cancer Sister   . Lung cancer Brother    Social History  Substance Use Topics  . Smoking status: Never Smoker   . Smokeless tobacco: Never Used  . Alcohol Use: No   OB History    No data available     Review of Systems  10 systems reviewed and found to be negative, except as noted in the HPI.  Allergies  Ace inhibitors and Lactose intolerance (gi)  Home Medications   Prior to Admission medications   Medication Sig Start Date End Date Taking? Authorizing Provider  aspirin 81 MG chewable tablet Chew 81 mg by mouth every morning.    Yes Historical Provider, MD  atenolol (TENORMIN) 50 MG tablet TAKE 1 TABLET EVERY DAY (NEED OFFICE VISIT FOR MORE REFILLS) Patient taking differently: Take 50 mg by mouth daily.  11/02/12  Yes Dorena Cookey, MD  calcitonin, salmon, (MIACALCIN/FORTICAL) 200 UNIT/ACT nasal spray Place 1 spray into alternate nostrils daily.  07/21/15  Yes Historical Provider, MD  DULoxetine (CYMBALTA) 60 MG capsule Take 60 mg by mouth daily.  06/12/15  Yes Historical  Provider, MD  hydrochlorothiazide (HYDRODIURIL) 25 MG tablet Take 1 tablet (25 mg total) by mouth daily. 11/02/12  Yes Dorena Cookey, MD  losartan (COZAAR) 50 MG tablet Take 50 mg by mouth daily.  07/15/15  Yes Historical Provider, MD  Probiotic Product (PROBIOTIC PO) Take 1 capsule by mouth daily.   Yes Historical Provider, MD  traZODone (DESYREL) 50 MG tablet Take 1 tablet (50 mg total) by mouth 2 (two) times daily. Patient taking differently: Take 75 mg by mouth at bedtime.  12/13/12  Yes Dorena Cookey, MD  VOLTAREN 1 % GEL Apply 4 g topically 4 (four) times daily.  06/12/15  Yes Historical Provider, MD  carboxymethylcellulose (REFRESH TEARS) 0.5 % SOLN Apply 1 drop  to eye daily as needed (dry eyes).     Historical Provider, MD  citalopram (CELEXA) 20 MG tablet TAKE  1  AND  1/2  TABLETS AT BEDTIME (NEED OFFICE VISIT FOR MORE REFILLS) 11/02/12 11/02/13  Dorena Cookey, MD   BP 179/81 mmHg  Pulse 80  Temp(Src) 98.2 F (36.8 C) (Oral)  Resp 16  SpO2 95% Physical Exam  Constitutional: She is oriented to person, place, and time. She appears well-developed and well-nourished. No distress.  HENT:  Head: Normocephalic and atraumatic.  Mouth/Throat: Oropharynx is clear and moist.  Eyes: Conjunctivae and EOM are normal. Pupils are equal, round, and reactive to light.  Neck: Normal range of motion. Neck supple.  Cardiovascular: Normal rate, regular rhythm and intact distal pulses.   Pulmonary/Chest: Effort normal and breath sounds normal. No respiratory distress. She has no wheezes. She has no rales. She exhibits no tenderness.  Abdominal: Soft. Bowel sounds are normal. She exhibits no distension and no mass. There is tenderness. There is no rebound and no guarding.  Mild left lower quadrant pain without guarding or rebound  Musculoskeletal: Normal range of motion.  Neurological: She is alert and oriented to person, place, and time.  No point tenderness to percussion of lumbar spinal processes.  No TTP or paraspinal muscular spasm. Strength is 5 out of 5 to bilateral lower extremities at hip and knee; extensor hallucis longus 5 out of 5. Ankle strength 5 out of 5, no clonus, neurovascularly intact. No saddle anaesthesia. Patellar reflexes are 2+ bilaterally.    Skin: She is not diaphoretic.  Psychiatric: She has a normal mood and affect.  Nursing note and vitals reviewed.   ED Course  Procedures (including critical care time) Labs Review Labs Reviewed  COMPREHENSIVE METABOLIC PANEL - Abnormal; Notable for the following:    Sodium 122 (*)    Potassium 3.3 (*)    Chloride 85 (*)    Glucose, Bld 137 (*)    All other components within normal limits  CBC  - Abnormal; Notable for the following:    WBC 16.3 (*)    All other components within normal limits  MAGNESIUM - Abnormal; Notable for the following:    Magnesium 1.6 (*)    All other components within normal limits  GASTROINTESTINAL PANEL BY PCR, STOOL (REPLACES STOOL CULTURE)  LIPASE, BLOOD  URINALYSIS, ROUTINE W REFLEX MICROSCOPIC (NOT AT [facility])  CBC WITH DIFFERENTIAL/PLATELET  I-STAT CG4 LACTIC ACID, ED    Imaging Review Ct Lumbar Spine Wo Contrast  07/23/2015  CLINICAL DATA:  Abdominal pain. Low back pain with nausea and vomiting for 2 days. EXAM: CT LUMBAR SPINE WITHOUT CONTRAST TECHNIQUE: Multidetector CT imaging of the lumbar spine was performed without intravenous contrast administration. Multiplanar CT image  reconstructions were also generated. COMPARISON:  Lumbar spine radiographs 07/21/2015. Two-view chest x-ray 06/04/2011. FINDINGS: The lumbar spine is imaged from the midbody of T11 through S2. Acute superior endplate fractures present at T12 without significant retropulsion of bone. Paraspinal hemorrhage is noted without displacement of the aorta. Atherosclerotic calcifications are present in the aorta without aneurysm. There is no significant adenopathy or other focal lesion. Rightward curvature of the thoracolumbar spine is centered at T12-L1. There is leftward curvature at L4-5. Chronic endplate changes are present on the right at L3-4 and L4-5. There chronic endplate changes on the left at T12-L1. Bone island is present at L3. T12-L1: A leftward disc protrusion is present. Left foraminal stenosis is present. L1-2: A broad-based disc protrusion is asymmetric to the left. Left foraminal narrowing is present. L2-3: A broad-based disc protrusion is present. Moderate facet hypertrophy is noted bilaterally. Mild left foraminal narrowing is present. L3-4: There is uncovering of a broad-based disc protrusion. Advanced facet hypertrophy and spurring is noted this leads to moderate to severe  central and bilateral foraminal stenosis, left greater than right. L4-5: A broad-based disc protrusion is present. Advanced facet hypertrophy is noted. Spurring leads to moderate foraminal stenosis bilaterally, left greater than right. L5-S1: A shallow central disc protrusion is present. Moderate facet hypertrophy is noted without significant stenosis. IMPRESSION: 1. Superior endplate compression fracture at T12 with 40% loss of height but no retropulsion of bone. 2. Rightward curvature of the lumbar spine centered at T12-L1. 3. Leftward curvature centered at L4. 4. Left foraminal stenosis at T12-L1, L1-2, and L2-3. 5. Moderate to severe central and bilateral foraminal narrowing at L3-4, left greater than right. 6. Moderate foraminal stenosis bilaterally at L4-5 is worse on the left. Electronically Signed   By: San Morelle M.D.   On: 07/23/2015 19:24   Ct Abdomen Pelvis W Contrast  07/23/2015  CLINICAL DATA:  Abdominal pain with nausea and vomiting for 2 days. EXAM: CT ABDOMEN AND PELVIS WITH CONTRAST TECHNIQUE: Multidetector CT imaging of the abdomen and pelvis was performed using the standard protocol following bolus administration of intravenous contrast. CONTRAST:  137mL ISOVUE-300 IOPAMIDOL (ISOVUE-300) INJECTION 61% COMPARISON:  None. FINDINGS: Lower chest:  Scarring noted posterior medial right lower lobe. Hepatobiliary: No focal abnormality within the liver parenchyma. There is no evidence for gallstones, gallbladder wall thickening, or pericholecystic fluid. No intrahepatic or extrahepatic biliary dilation. Pancreas: No focal mass lesion. No dilatation of the main duct. No intraparenchymal cyst. No peripancreatic edema. Spleen: No splenomegaly. No focal mass lesion. Adrenals/Urinary Tract: No adrenal nodule or mass. Tiny cortical hypo attenuating lesions in the kidneys bilaterally are too small to characterize but likely represent cysts. No evidence for hydroureter. The urinary bladder appears  normal for the degree of distention. Stomach/Bowel: Small hiatal hernia. Stomach otherwise unremarkable. Duodenum is normally positioned as is the ligament of Treitz. No small bowel wall thickening. No small bowel dilatation. Terminal ileum is normal. The appendix is not visualized, but there is no edema or inflammation in the region of the cecum. Diverticular changes are noted in the left colon without evidence of diverticulitis. Vascular/Lymphatic: There is abdominal aortic atherosclerosis without aneurysm. There is no gastrohepatic or hepatoduodenal ligament lymphadenopathy. No intraperitoneal or retroperitoneal lymphadenopathy. No pelvic sidewall lymphadenopathy. Reproductive: The uterus is surgically absent. There is no adnexal mass. Other: No intraperitoneal free fluid. Musculoskeletal: Pelvic floor laxity noted. 8 mm sclerotic lesion left iliac crest presumably a bone island. No other worrisome lytic or sclerotic osseous abnormality. Similar 7 mm  sclerotic lesion L3 vertebral body likely a bone island compression fracture noted at T12 is likely acute and results and anterior loss of vertebral body height. IMPRESSION: 1. No acute intraperitoneal abnormality of the abdomen or pelvis. 2. T12 compression fracture appears acute by CT imaging. 3. Small hiatal hernia. 4. Pelvic floor laxity. Imaging findings of potential clinical significance: Abdominal Aortic Atherosclerois (ICD10-170.0) Electronically Signed   By: Misty Stanley M.D.   On: 07/23/2015 19:20   I have personally reviewed and evaluated these images and lab results as part of my medical decision-making.   EKG Interpretation None      MDM   Final diagnoses:  Hyponatremia  Hypokalemia  Hypomagnesemia  Nausea vomiting and diarrhea  Compression fracture of T12 vertebra, initial encounter ([place])    Filed Vitals:   07/23/15 1723 07/23/15 1736 07/23/15 2034 07/23/15 2100  BP: 197/83 179/95 203/89 179/81  Pulse: 81 77 80   Temp: 98.2 F  (36.8 C)     TempSrc: Oral     Resp: 18 16    SpO2: 98% 99% 95%     Medications  magnesium oxide (MAG-OX) tablet 200 mg (not administered)  ondansetron (ZOFRAN-ODT) disintegrating tablet 4 mg (4 mg Oral Given 07/23/15 1501)  sodium chloride 0.9 % bolus 500 mL (0 mLs Intravenous Stopped 07/23/15 2104)  morphine 2 MG/ML injection 2 mg (2 mg Intravenous Given 07/23/15 1820)  ondansetron (ZOFRAN) injection 4 mg (4 mg Intravenous Given 07/23/15 1820)  iopamidol (ISOVUE-300) 61 % injection 100 mL (100 mLs Intravenous Contrast Given 07/23/15 1859)  sodium chloride 0.9 % bolus 500 mL (500 mLs Intravenous New Bag/Given 07/23/15 2104)  potassium chloride SA (K-DUR,KLOR-CON) CR tablet 40 mEq (40 mEq Oral Given 07/23/15 2103)    [name] is 82 y.o. female presenting with Low back pain, generalized fatigue, multiple episodes of dry heaving while in the waiting room she was incontinent of stool at that time. Patient afebrile, abdominal exam is benign with very mild tenderness palpation along left lower quadrant, no focal bony tenderness to percussion along the lumbar spine.  CT abdomen pelvis without acute abnormality, he noted a T12 compression fracture, this is not an area of her pain.   Patient has a white count of 16.3 sodium is low at 122, potassium 3.3 and magnesium 1.6. Patient is tolerating by mouth's, she's received 1 L of normal saline. Attempted to cannulate this patient she felt very weak and could not ambulate independently. Will need admission for hyponatremia. Will order CBC with differential to evaluate the leukocytosis.  Monico Blitz, PA-C 07/23/15 2140  Davonna Belling, MD 07/24/15 [phone]

## 2015-07-23 NOTE — ED Notes (Signed)
Pt's daughter upset and threatening a law suit d/t Pt having to wait.  It was explained to Pt and daughter that her vital signs look good and we are completing lab work to check for abnormalities.  Pt's daughter reports "all the lab work will be fine.  Her doctor thinks that she needs a scan."  This RN apologized again and asked if there was anything further that I could do.  Daughter reports "it's uncomfortable out there."  This RN apologized again.

## 2015-07-23 NOTE — ED Notes (Signed)
Pt tolerating fluids.   

## 2015-07-23 NOTE — ED Notes (Signed)
Pt reports taking a laxative this morning to "help with the pain."  Pt became incontinent of stool d/t dry heaving.  Pt was cleaned and pad changed by this RN.

## 2015-07-23 NOTE — ED Notes (Signed)
Per EMS, Pt, from home, c/o abdominal pain, low back pain, and n/v x 2 days.  Pain score 8/10.  Pt was recently seen by PCP for same.  Sts UA and x-ray were negative.

## 2015-07-23 NOTE — H&P (Signed)
History and Physical    [name] [id number] DOB: 07-17-1921 DOA: 07/23/2015  PCP: Joycelyn Man, MD   Patient coming from: Home.  Chief Complaint: Abdominal and back pain.  HPI: [name] is a 82 y.o. female with medical history significant of hypertension, diverticulosis, osteoarthritis, anxiety, depression, endometrial cancer who comes to emergency department due to progressively worse left-sided lower abdominal and lower back pain since Thursday. The patient denies any specific trauma or fall, but remembers that the pain started after she got up from bed to go to the bathroom and worsened when she got back to bed. She denies fever, chills, but feels fatigue from the pain. She has not been able to ambulate, to move around or sleep well since this happened due to pain.   The patient had a single episode of nausea and emesis earlier, which she relates to pain. She states that she has frequent diarrhea and is lactose intolerant. She denies flank tenderness or GU symptoms. She denies chest pain, palpitations, dizziness, diaphoresis, or pitting edema of the lower extremities.  ED Course: The patient received analgesics and antiemetics. She states that her pain is much better and she has been able to move and ambulate better. Workup shows leukocytosis of 16.3 K, hyponatremia at 122 mmol per liter, hypokalemia 3.3 mmol per liter,  acute T12 compression fracture and extensive foraminal narrowing of the lumbar spine on CT imaging. She was recently started on nasal calcitonin for osteoporosis, but is not taking calcium supplementation.   Review of Systems: As per HPI otherwise 10 point review of systems negative.    Past Medical History  Diagnosis Date  . Hypertension   . Diverticulitis   . Depression   . Arthritis     Hands/knees/hip/lower back - no meds  . Anxiety   . Cancer [facility])     ENDOMETRIAL CANCER    Past Surgical History  Procedure Laterality Date  . Colonscopy   2006  . Wisdom tooth extraction    . Svd      x 1  . Eye surgery      Bilateral cataract eye surgery  . Abdominal surgery      removed fibroid and one fallopian tube removed  . Appendectomy    . Dilation and curettage of uterus    . Hysteroscopy w/d&c  04/28/2011    Procedure: DILATATION AND CURETTAGE /HYSTEROSCOPY;  Surgeon: Gus Height, MD;  Location: [facility];  Service: Gynecology;  Laterality: N/A;  . Robotic assisted lap vaginal hysterectomy  06/08/2011    Procedure: ROBOTIC ASSISTED LAPAROSCOPIC VAGINAL HYSTERECTOMY;  Surgeon: Imagene Gurney A. Alycia Rossetti, MD;  Location: [facility];  Service: Gynecology;  Laterality: N/A;  . Lymph node dissection  06/08/2011    Procedure: LYMPH NODE DISSECTION;  Surgeon: Imagene Gurney A. Alycia Rossetti, MD;  Location: [facility];  Service: Gynecology;  Laterality: N/A;  Possible Lymph Nodes  . Abdominal hysterectomy       reports that she has never smoked. She has never used smokeless tobacco. She reports that she does not drink alcohol or use illicit drugs.  Allergies  Allergen Reactions  . Ace Inhibitors Cough  . Lactose Intolerance (Gi)     Family History  Problem Relation Age of Onset  . Breast cancer Sister   . Lung cancer Brother     Prior to Admission medications   Medication Sig Start Date End Date Taking? Authorizing Provider  aspirin 81 MG chewable tablet Chew 81 mg by mouth every morning.  Yes Historical Provider, MD  atenolol (TENORMIN) 50 MG tablet TAKE 1 TABLET EVERY DAY (NEED OFFICE VISIT FOR MORE REFILLS) Patient taking differently: Take 50 mg by mouth daily.  11/02/12  Yes Dorena Cookey, MD  calcitonin, salmon, (MIACALCIN/FORTICAL) 200 UNIT/ACT nasal spray Place 1 spray into alternate nostrils daily.  07/21/15  Yes Historical Provider, MD  DULoxetine (CYMBALTA) 60 MG capsule Take 60 mg by mouth daily.  06/12/15  Yes Historical Provider, MD  hydrochlorothiazide (HYDRODIURIL) 25 MG tablet Take 1 tablet (25 mg total) by mouth daily. 11/02/12  Yes Dorena Cookey, MD    losartan (COZAAR) 50 MG tablet Take 50 mg by mouth daily.  07/15/15  Yes Historical Provider, MD  Probiotic Product (PROBIOTIC PO) Take 1 capsule by mouth daily.   Yes Historical Provider, MD  traZODone (DESYREL) 50 MG tablet Take 1 tablet (50 mg total) by mouth 2 (two) times daily. Patient taking differently: Take 75 mg by mouth at bedtime.  12/13/12  Yes Dorena Cookey, MD  VOLTAREN 1 % GEL Apply 4 g topically 4 (four) times daily.  06/12/15  Yes Historical Provider, MD  carboxymethylcellulose (REFRESH TEARS) 0.5 % SOLN Apply 1 drop to eye daily as needed (dry eyes).     Historical Provider, MD  citalopram (CELEXA) 20 MG tablet TAKE  1  AND  1/2  TABLETS AT BEDTIME (NEED OFFICE VISIT FOR MORE REFILLS) 11/02/12 11/02/13  Dorena Cookey, MD    Physical Exam: Filed Vitals:   07/23/15 2034 07/23/15 2100 07/23/15 2200 07/23/15 2248  BP: 203/89 179/81 158/138 166/84  Pulse: 80   70  Temp:    97.9 F (36.6 C)  TempSrc:    Oral  Resp:    18  Height:    5' 3.5" (1.613 m)  Weight:    67.1 kg (147 lb 14.9 oz)  SpO2: 95%   96%      Constitutional: NAD, calm, comfortable Filed Vitals:   07/23/15 2034 07/23/15 2100 07/23/15 2200 07/23/15 2248  BP: 203/89 179/81 158/138 166/84  Pulse: 80   70  Temp:    97.9 F (36.6 C)  TempSrc:    Oral  Resp:    18  Height:    5' 3.5" (1.613 m)  Weight:    67.1 kg (147 lb 14.9 oz)  SpO2: 95%   96%   Eyes: PERRL, lids and conjunctivae normal ENMT: Mucous membranes are moist. Posterior pharynx clear of any exudate or lesions. Neck: normal, supple, no masses, no thyromegaly Respiratory: Clear to auscultation bilaterally, no wheezing, no crackles. Normal respiratory effort.  Cardiovascular: Regular rate and rhythm, no murmurs / rubs / gallops. No extremity edema. 2+ pedal pulses. No carotid bruits.  Abdomen:Bowel sounds positive. Mild LLQ and left rectus abdominalis insertion area tenderness, no masses palpated. No hepatosplenomegaly.  Back: Mild left lower  lumbar area paraspinal muscles tenderness.  Musculoskeletal: no clubbing / cyanosis. No joint deformity upper and lower extremities. Good ROM, no contractures. Normal muscle tone.  Skin: no rashes, lesions, ulcers. No induration Neurologic: CN 2-12 grossly intact. Sensation intact, DTR normal. Strength 5/5 in all 4.  Psychiatric: Normal judgment and insight. Alert and oriented x 4. Normal mood.    Labs on Admission: I have personally reviewed following labs and imaging studies  CBC:  Recent Labs Lab 07/23/15 1516 07/23/15 2234  WBC 16.3* 14.2*  NEUTROABS  --  9.9*  HGB 15.0 12.6  HCT 42.9 36.3  MCV 87.0 86.0  PLT 348  [id number]   Basic Metabolic Panel:  Recent Labs Lab 07/23/15 1516 07/23/15 1957  NA 122*  --   K 3.3*  --   CL 85*  --   CO2 25  --   GLUCOSE 137*  --   BUN 16  --   CREATININE 0.77  --   CALCIUM 9.6  --   MG  --  1.6*   GFR: Estimated Creatinine Clearance: 41.8 mL/min (by C-G formula based on Cr of 0.77). Liver Function Tests:  Recent Labs Lab 07/23/15 1516  AST 22  ALT 15  ALKPHOS 103  BILITOT 1.1  PROT 8.0  ALBUMIN 4.7    Recent Labs Lab 07/23/15 1516  LIPASE 19   Urine analysis:    Component Value Date/Time   COLORURINE YELLOW 07/23/2015 2118   APPEARANCEUR CLEAR 07/23/2015 2118   LABSPEC 1.036* 07/23/2015 2118   PHURINE 7.0 07/23/2015 2118   GLUCOSEU NEGATIVE 07/23/2015 2118   HGBUR NEGATIVE 07/23/2015 2118   BILIRUBINUR NEGATIVE 07/23/2015 2118   KETONESUR 15* 07/23/2015 2118   PROTEINUR NEGATIVE 07/23/2015 2118   NITRITE NEGATIVE 07/23/2015 2118   LEUKOCYTESUR NEGATIVE 07/23/2015 2118    Radiological Exams on Admission: Ct Lumbar Spine Wo Contrast  07/23/2015  CLINICAL DATA:  Abdominal pain. Low back pain with nausea and vomiting for 2 days. EXAM: CT LUMBAR SPINE WITHOUT CONTRAST TECHNIQUE: Multidetector CT imaging of the lumbar spine was performed without intravenous contrast administration. Multiplanar CT image  reconstructions were also generated. COMPARISON:  Lumbar spine radiographs 07/21/2015. Two-view chest x-ray 06/04/2011. FINDINGS: The lumbar spine is imaged from the midbody of T11 through S2. Acute superior endplate fractures present at T12 without significant retropulsion of bone. Paraspinal hemorrhage is noted without displacement of the aorta. Atherosclerotic calcifications are present in the aorta without aneurysm. There is no significant adenopathy or other focal lesion. Rightward curvature of the thoracolumbar spine is centered at T12-L1. There is leftward curvature at L4-5. Chronic endplate changes are present on the right at L3-4 and L4-5. There chronic endplate changes on the left at T12-L1. Bone island is present at L3. T12-L1: A leftward disc protrusion is present. Left foraminal stenosis is present. L1-2: A broad-based disc protrusion is asymmetric to the left. Left foraminal narrowing is present. L2-3: A broad-based disc protrusion is present. Moderate facet hypertrophy is noted bilaterally. Mild left foraminal narrowing is present. L3-4: There is uncovering of a broad-based disc protrusion. Advanced facet hypertrophy and spurring is noted this leads to moderate to severe central and bilateral foraminal stenosis, left greater than right. L4-5: A broad-based disc protrusion is present. Advanced facet hypertrophy is noted. Spurring leads to moderate foraminal stenosis bilaterally, left greater than right. L5-S1: A shallow central disc protrusion is present. Moderate facet hypertrophy is noted without significant stenosis. IMPRESSION: 1. Superior endplate compression fracture at T12 with 40% loss of height but no retropulsion of bone. 2. Rightward curvature of the lumbar spine centered at T12-L1. 3. Leftward curvature centered at L4. 4. Left foraminal stenosis at T12-L1, L1-2, and L2-3. 5. Moderate to severe central and bilateral foraminal narrowing at L3-4, left greater than right. 6. Moderate foraminal  stenosis bilaterally at L4-5 is worse on the left. Electronically Signed   By: San Morelle M.D.   On: 07/23/2015 19:24   Ct Abdomen Pelvis W Contrast  07/23/2015  CLINICAL DATA:  Abdominal pain with nausea and vomiting for 2 days. EXAM: CT ABDOMEN AND PELVIS WITH CONTRAST TECHNIQUE: Multidetector CT imaging of the abdomen and pelvis  was performed using the standard protocol following bolus administration of intravenous contrast. CONTRAST:  169mL ISOVUE-300 IOPAMIDOL (ISOVUE-300) INJECTION 61% COMPARISON:  None. FINDINGS: Lower chest:  Scarring noted posterior medial right lower lobe. Hepatobiliary: No focal abnormality within the liver parenchyma. There is no evidence for gallstones, gallbladder wall thickening, or pericholecystic fluid. No intrahepatic or extrahepatic biliary dilation. Pancreas: No focal mass lesion. No dilatation of the main duct. No intraparenchymal cyst. No peripancreatic edema. Spleen: No splenomegaly. No focal mass lesion. Adrenals/Urinary Tract: No adrenal nodule or mass. Tiny cortical hypo attenuating lesions in the kidneys bilaterally are too small to characterize but likely represent cysts. No evidence for hydroureter. The urinary bladder appears normal for the degree of distention. Stomach/Bowel: Small hiatal hernia. Stomach otherwise unremarkable. Duodenum is normally positioned as is the ligament of Treitz. No small bowel wall thickening. No small bowel dilatation. Terminal ileum is normal. The appendix is not visualized, but there is no edema or inflammation in the region of the cecum. Diverticular changes are noted in the left colon without evidence of diverticulitis. Vascular/Lymphatic: There is abdominal aortic atherosclerosis without aneurysm. There is no gastrohepatic or hepatoduodenal ligament lymphadenopathy. No intraperitoneal or retroperitoneal lymphadenopathy. No pelvic sidewall lymphadenopathy. Reproductive: The uterus is surgically absent. There is no adnexal  mass. Other: No intraperitoneal free fluid. Musculoskeletal: Pelvic floor laxity noted. 8 mm sclerotic lesion left iliac crest presumably a bone island. No other worrisome lytic or sclerotic osseous abnormality. Similar 7 mm sclerotic lesion L3 vertebral body likely a bone island compression fracture noted at T12 is likely acute and results and anterior loss of vertebral body height. IMPRESSION: 1. No acute intraperitoneal abnormality of the abdomen or pelvis. 2. T12 compression fracture appears acute by CT imaging. 3. Small hiatal hernia. 4. Pelvic floor laxity. Imaging findings of potential clinical significance: Abdominal Aortic Atherosclerois (ICD10-170.0) Electronically Signed   By: Misty Stanley M.D.   On: 07/23/2015 19:20    Assessment/Plan Principal Problem:   Hyponatremia Telemetry/observation. Continue IV hydration with normal saline. Discontinue hydrochlorothiazide. Check urine sodium and osmolality. Check serum osmolality with next blood draw. Consider expanding workup and/or subspecialty consult if no improvement.  Active Problems:   Compression fracture of thoracic vertebra (HCC)   Back pain Analgesics as needed. May benefit from outpatient follow-up with orthopedic or neurosurgery for spine evaluation. The patient was advised to start calcium and continue vitamin D supplementation and continue nasal calcitonin spray to treat osteoporosis.      Abdominal pain No acute abnormality on CT scan.  Likely due to lower back instability, as the patient states that the abdominal pain is related to her back pain. Continue analgesics as needed.    Essential hypertension Discontinue hydrochlorothiazide. Continue losartan 50 mg by mouth daily Continue atenolol 50 mg by mouth daily May need adjustment of his medications or addition of another antihypertensive agent. Monitor blood pressure.    Insomnia Continue trazodone 75 mg by mouth at bedtime.    Hypokalemia Likely due to  chronic hydrochlorothiazide use and a episode of emesis today  Replacing through the IV fluids. Follow-up potassium level in the morning.    Hypomagnesemia Replaced.    Leukocytosis Patient denies any specific symptoms of infection. She is afebrile. Follow-up CBC in the morning.    Depression Continue duloxetine 60 mg by mouth daily. Continue nocturnal trazodone.   DVT prophylaxis: Lovenox. Code Status: Full code. Family Communication: Her daughter [name] was present in the room. Disposition Plan: Admit for IV hydration with normal saline  and basic hyponatremia workup. Consults called:  Admission status: Observation/telemetry   Reubin Milan MD [facility] Pager [phone].  If 7PM-7AM, please contact night-coverage [email or web address] Password [facility]  07/23/2015, 11:16 PM

## 2015-07-23 NOTE — ED Notes (Signed)
Attempted to ambulate pt; pt stood for about seconds before saying that she felt dizzy and weak; pt assisted back into bed

## 2015-07-23 NOTE — ED Notes (Signed)
Pt was notified about Urinalysis

## 2015-07-24 ENCOUNTER — Observation Stay (HOSPITAL_COMMUNITY): Payer: Medicare Other

## 2015-07-24 DIAGNOSIS — E871 Hypo-osmolality and hyponatremia: Secondary | ICD-10-CM | POA: Diagnosis not present

## 2015-07-24 DIAGNOSIS — M1612 Unilateral primary osteoarthritis, left hip: Secondary | ICD-10-CM | POA: Diagnosis not present

## 2015-07-24 DIAGNOSIS — M4854XA Collapsed vertebra, not elsewhere classified, thoracic region, initial encounter for fracture: Secondary | ICD-10-CM | POA: Diagnosis not present

## 2015-07-24 LAB — CBC WITH DIFFERENTIAL/PLATELET
Basophils Absolute: 0 10*3/uL (ref 0.0–0.1)
Basophils Relative: 0 %
Eosinophils Absolute: 0.1 10*3/uL (ref 0.0–0.7)
Eosinophils Relative: 1 %
HEMATOCRIT: 37.3 % (ref 36.0–46.0)
HEMOGLOBIN: 12.8 g/dL (ref 12.0–15.0)
LYMPHS ABS: 2.1 10*3/uL (ref 0.7–4.0)
LYMPHS PCT: 21 %
MCH: 30.2 pg (ref 26.0–34.0)
MCHC: 34.3 g/dL (ref 30.0–36.0)
MCV: 88 fL (ref 78.0–100.0)
Monocytes Absolute: 1.4 10*3/uL — ABNORMAL HIGH (ref 0.1–1.0)
Monocytes Relative: 13 %
NEUTROS PCT: 65 %
Neutro Abs: 6.7 10*3/uL (ref 1.7–7.7)
PLATELETS: 287 10*3/uL (ref 150–400)
RBC: 4.24 MIL/uL (ref 3.87–5.11)
RDW: 13.6 % (ref 11.5–15.5)
WBC: 10.3 10*3/uL (ref 4.0–10.5)

## 2015-07-24 LAB — GASTROINTESTINAL PANEL BY PCR, STOOL (REPLACES STOOL CULTURE)
ADENOVIRUS F40/41: NOT DETECTED
Astrovirus: NOT DETECTED
CAMPYLOBACTER SPECIES: NOT DETECTED
CRYPTOSPORIDIUM: NOT DETECTED
CYCLOSPORA CAYETANENSIS: NOT DETECTED
E. coli O157: NOT DETECTED
Entamoeba histolytica: NOT DETECTED
Enteroaggregative E coli (EAEC): NOT DETECTED
Enteropathogenic E coli (EPEC): NOT DETECTED
Enterotoxigenic E coli (ETEC): NOT DETECTED
Giardia lamblia: NOT DETECTED
Norovirus GI/GII: NOT DETECTED
PLESIMONAS SHIGELLOIDES: NOT DETECTED
ROTAVIRUS A: NOT DETECTED
SALMONELLA SPECIES: NOT DETECTED
SHIGA LIKE TOXIN PRODUCING E COLI (STEC): NOT DETECTED
SHIGELLA/ENTEROINVASIVE E COLI (EIEC): NOT DETECTED
Sapovirus (I, II, IV, and V): NOT DETECTED
VIBRIO SPECIES: NOT DETECTED
Vibrio cholerae: NOT DETECTED
YERSINIA ENTEROCOLITICA: NOT DETECTED

## 2015-07-24 LAB — COMPREHENSIVE METABOLIC PANEL
ALT: 11 U/L — ABNORMAL LOW (ref 14–54)
AST: 16 U/L (ref 15–41)
Albumin: 3.6 g/dL (ref 3.5–5.0)
Alkaline Phosphatase: 77 U/L (ref 38–126)
Anion gap: 9 (ref 5–15)
BUN: 13 mg/dL (ref 6–20)
CHLORIDE: 95 mmol/L — AB (ref 101–111)
CO2: 25 mmol/L (ref 22–32)
Calcium: 8.9 mg/dL (ref 8.9–10.3)
Creatinine, Ser: 0.68 mg/dL (ref 0.44–1.00)
Glucose, Bld: 105 mg/dL — ABNORMAL HIGH (ref 65–99)
POTASSIUM: 3.1 mmol/L — AB (ref 3.5–5.1)
Sodium: 129 mmol/L — ABNORMAL LOW (ref 135–145)
Total Bilirubin: 0.8 mg/dL (ref 0.3–1.2)
Total Protein: 6.2 g/dL — ABNORMAL LOW (ref 6.5–8.1)

## 2015-07-24 LAB — SODIUM, URINE, RANDOM

## 2015-07-24 LAB — MAGNESIUM: MAGNESIUM: 1.7 mg/dL (ref 1.7–2.4)

## 2015-07-24 LAB — OSMOLALITY: Osmolality: 272 mOsm/kg — ABNORMAL LOW (ref 275–295)

## 2015-07-24 LAB — OSMOLALITY, URINE: OSMOLALITY UR: 132 mosm/kg — AB (ref 300–900)

## 2015-07-24 MED ORDER — HYDROCODONE-ACETAMINOPHEN 5-325 MG PO TABS: 1.0000 | ORAL_TABLET | Freq: Four times a day (QID) | ORAL | Status: DC | PRN

## 2015-07-24 MED ORDER — HYDROCODONE-ACETAMINOPHEN 5-325 MG PO TABS
1.0000 | ORAL_TABLET | ORAL | Status: DC | PRN
  Administered 2015-07-24: 1 via ORAL
  Filled 2015-07-24: qty 1

## 2015-07-24 MED ORDER — MAGNESIUM SULFATE IN D5W 1-5 GM/100ML-% IV SOLN
1.0000 g | Freq: Once | INTRAVENOUS | Status: AC
  Administered 2015-07-24: 1 g via INTRAVENOUS
  Filled 2015-07-24: qty 100

## 2015-07-24 MED ORDER — POTASSIUM CHLORIDE IN NACL 20-0.9 MEQ/L-% IV SOLN
INTRAVENOUS | Status: DC
  Filled 2015-07-24 (×2): qty 1000

## 2015-07-24 MED ORDER — POTASSIUM CHLORIDE CRYS ER 20 MEQ PO TBCR
40.0000 meq | EXTENDED_RELEASE_TABLET | Freq: Four times a day (QID) | ORAL | Status: DC
  Administered 2015-07-24: 40 meq via ORAL
  Filled 2015-07-24: qty 2

## 2015-07-24 NOTE — Care Management Note (Signed)
Case Management Note  Patient Details  Name: [name] MRN: [id number] Date of Birth: 11-03-1921  Subjective/Objective: PT-recc HHPT. MD ordered hospital bed,3n1,rw,HHRN/PT/social worker. Spoke to patient/dtr-[name] about d/c plans-they would prefer ALF @ [facility]-CSW has already addressed that-[facility] will contact them @ home about [facility] chosen for [employer] aware. [facility] dme rep Jermaine aware of referral for dme-rw,3n1(to be delivered to rm here in hospital prior d/c) & hospital bed to go to patient's home sometime today but doesn't have to get there prior to patient getting home-dtr has recliner on 1st level where hospital bed will  also go.Dtr will transport home on own.                   Action/Plan:d/c home w/HHC,& dme   Expected Discharge Date:                 Expected Discharge Plan:  [place]  In-House Referral:     Discharge planning Services  CM Consult  Post Acute Care Choice:    Choice offered to:  Adult Children  DME Arranged:  3-N-1, Walker rolling, Hospital bed DME Agency:  [place]:  RN, PT, Social Work [facility] Agency:  [place]  Status of Service:  Completed, signed off  If discussed at [employer], dates discussed:    Additional Comments:  Dessa Phi, RN 07/24/2015, 2:53 PM

## 2015-07-24 NOTE — Progress Notes (Signed)
[name] notified of >999 mL in bladder. Will continue to monitor pt and carry out any new orders.  Carnella [place] I

## 2015-07-24 NOTE — Care Management Note (Signed)
Case Management Note  Patient Details  Name: [name] MRN: [id number] Date of Birth: [birth year]  Subjective/Objective:  Patient/dtr now states she wants all dme to go to the home.[place] rep Santiago Glad & [facility] dme rep Brenton Grills aware of all dme to patient's home.Nsg informed that patient can d/c & not waiting on any further d/c needs.                 Action/Plan:d/c home w/HHC/DME to home.   Expected Discharge Date:                Expected Discharge Plan:  [place]  In-House Referral:     Discharge planning Services  CM Consult  Post Acute Care Choice:    Choice offered to:  Adult Children  DME Arranged:  3-N-1, Walker rolling, Hospital bed DME Agency:  [place]:  RN, PT, Social Work [facility] Agency:  [place]  Status of Service:  Completed, signed off  If discussed at [employer], dates discussed:    Additional Comments:  Dessa Phi, RN 07/24/2015, 3:02 PM

## 2015-07-24 NOTE — NC FL2 (Signed)
[place] LEVEL OF CARE SCREENING TOOL     IDENTIFICATION  Patient Name: [name] Birthdate: 1921/09/22 Sex: female Admission Date (Current Location): 07/23/2015  [facility] and [place] Number:  Herbalist and Address:  [facility],  [place] [address], [place]      Provider Number: [id number]  Attending Physician Name and Address:  Thurnell Lose, MD  Relative Name and Phone Number:       Current Level of Care: Hospital Recommended Level of Care: [place] Prior Approval Number:    Date Approved/Denied:   PASRR Number:    Discharge Plan: Home    Current Diagnoses: Patient Active Problem List   Diagnosis Date Noted  . Hyponatremia 07/23/2015  . Hypokalemia 07/23/2015  . Hypomagnesemia 07/23/2015  . Leukocytosis 07/23/2015  . Depression 07/23/2015  . Abdominal pain 07/23/2015  . Back pain 07/23/2015  . Compression fracture of thoracic vertebra (HCC) 07/23/2015  . Insomnia 08/26/2011  . Endometrial ca ([place]) 05/12/2011  . Left knee pain 04/07/2011  . Calf pain 04/07/2011  . Essential hypertension 01/15/2010  . ARTHRITIS 01/15/2010  . DIVERTICULITIS, HX OF 01/15/2010    Orientation RESPIRATION BLADDER Height & Weight     Self  Normal Incontinent Weight: 147 lb 14.9 oz (67.1 kg) Height:  5' 3.5" (161.3 cm)  BEHAVIORAL SYMPTOMS/MOOD NEUROLOGICAL BOWEL NUTRITION STATUS      Incontinent Diet (Heart)  AMBULATORY STATUS COMMUNICATION OF NEEDS Skin   Limited Assist Verbally Normal                       Personal Care Assistance Level of Assistance  Bathing, Dressing Bathing Assistance: Limited assistance   Dressing Assistance: Limited assistance     Functional Limitations Info             SPECIAL CARE FACTORS FREQUENCY  PT (By licensed PT), OT (By licensed OT)                    Contractures      Additional Factors Info  Code Status, Allergies, Isolation Precautions Code  Status Info: Fullcode Allergies Info: Allergies:  Ace Inhibitors, Lactose Intolerance (Gi)           Current Medications (07/24/2015):  This is the current hospital active medication list Current Facility-Administered Medications  Medication Dose Route Frequency Provider Last Rate Last Dose  . aspirin chewable tablet 81 mg  81 mg Oral q morning - 10a Reubin Milan, MD   81 mg at 07/24/15 1030  . atenolol (TENORMIN) tablet 50 mg  50 mg Oral Daily Reubin Milan, MD   50 mg at 07/24/15 1030  . calcitonin (salmon) (MIACALCIN/FORTICAL) nasal spray 1 spray  1 spray Alternating Nares Daily Reubin Milan, MD      . DULoxetine (CYMBALTA) DR capsule 60 mg  60 mg Oral Daily Reubin Milan, MD   60 mg at 07/24/15 1030  . enoxaparin (LOVENOX) injection 40 mg  40 mg Subcutaneous QHS Reubin Milan, MD   40 mg at 07/23/15 2341  . HYDROcodone-acetaminophen (NORCO/VICODIN) 5-325 MG per tablet 1 tablet  1 tablet Oral Q4H PRN Thurnell Lose, MD   1 tablet at 07/24/15 1037  . losartan (COZAAR) tablet 50 mg  50 mg Oral Daily Reubin Milan, MD   50 mg at 07/24/15 1030  . ondansetron (ZOFRAN) injection 4 mg  4 mg Intravenous Q6H PRN Reubin Milan,  MD      . polyvinyl alcohol (LIQUIFILM TEARS) 1.4 % ophthalmic solution 1 drop  1 drop Both Eyes Daily PRN Reubin Milan, MD      . sodium chloride flush (NS) 0.9 % injection 3 mL  3 mL Intravenous Q12H Reubin Milan, MD   3 mL at 07/24/15 1000  . traZODone (DESYREL) tablet 75 mg  75 mg Oral QHS Reubin Milan, MD   75 mg at 07/23/15 2341     Discharge Medications: Please see discharge summary for a list of discharge medications.  Relevant Imaging Results:  Relevant Lab Results:   Additional Information SSN: [id number]  Standley Brooking, LCSW

## 2015-07-24 NOTE — Care Management Obs Status (Signed)
[place] NOTIFICATION   Patient Details  Name: [name] MRN: [id number] Date of Birth: [birth year]   Medicare Observation Status Notification Given:  Yes    MahabirJuliann Pulse, RN 07/24/2015, 12:39 PM

## 2015-07-24 NOTE — Discharge Summary (Signed)
[name] [id number] DOB: July 10, 1921 DOA: 07/23/2015  PCP: Joycelyn Man, MD  Admit date: 07/23/2015  Discharge date: 07/24/2015  Admitted From:  Home  Disposition: Home   Recommendations for Outpatient Follow-up:   Follow up with PCP in 1-2 weeks  PCP Please obtain BMP/CBC, 2 view CXR in 1week,  (see Discharge instructions)   PCP Please follow up on the following pending results: None   Home Health: Home health PT and RN   Equipment/Devices: Walker if qualifies  Discharge Condition: Stable    CODE STATUS: Full   Diet Recommendation: Heart healthy Consultations: Neurosurgeon Dr. Annette Stable over the phone  Chief Complaint  Patient presents with  . Abdominal Pain  . Back Pain  . Emesis     Brief history of present illness from the day of admission and additional interim summary    [name] is a 82 y.o. female with medical history significant of hypertension, diverticulosis, osteoarthritis, anxiety, depression, endometrial cancer who comes to emergency department due to progressively worse left-sided lower abdominal and lower back pain since Thursday. The patient denies any specific trauma or fall, but remembers that the pain started after she got up from bed to go to the bathroom and worsened when she got back to bed. She denies fever, chills, but feels fatigue from the pain. She has not been able to ambulate, to move around or sleep well since this happened due to pain.   The patient had a single episode of nausea and emesis earlier, which she relates to pain. She states that she has frequent diarrhea and is lactose intolerant. She denies flank tenderness or GU symptoms. She denies chest pain, palpitations, dizziness, diaphoresis, or pitting edema of the lower extremities.  ED Course: The patient  received analgesics and antiemetics. She states that her pain is much better and she has been able to move and ambulate better. Workup shows leukocytosis of 16.3 K, hyponatremia at 122 mmol per liter, hypokalemia 3.3 mmol per liter, acute T12 compression fracture and extensive foraminal narrowing of the lumbar spine on CT imaging. She was recently started on nasal calcitonin for osteoporosis, but is not taking calcium supplementation.  Hospital issues addressed     1.Back pain due to spontaneous T spine fracture. Case discussed in detail with neurosurgeon on call Dr. Deri Fuelling who reviewed the images, supportive care only, no need for surgical intervention or TLSO brace, patient will get home PT along with a few days of pain medications, discussed with daughter in detail, outpatient follow-up with Dr. Trenton Gammon. Defer management of osteoporosis to PCP.  2. Dehydration with mild hyponatremia and hypokalemia, hydrated with IV fluids, potassium replaced, discontinue HCTZ upon discharge, request PCP to monitor BMP closely.  3. Essential hypertension. Continue home regimen stop HCTZ due to #2 above.  4. Osteoporosis. On calcitonin, defer further management to PCP.     Discharge diagnosis     Principal Problem:   Hyponatremia Active Problems:   Essential hypertension   Insomnia   Hypokalemia   Hypomagnesemia  Leukocytosis   Depression   Abdominal pain   Back pain   Compression fracture of thoracic vertebra [facility])    Discharge instructions    Discharge Instructions    Diet - low sodium heart healthy    Complete by:  As directed      Discharge instructions    Complete by:  As directed   Follow with Primary MD TODD,JEFFREY ALLEN, MD in 2-3 days   Get CBC, CMP, 2 view Chest X ray checked  by Primary MD or SNF MD in 5-7 days ( we routinely change or add medications that can affect your baseline labs and fluid status, therefore we recommend that you get the mentioned basic workup next  visit with your PCP, your PCP may decide not to get them or add new tests based on their clinical decision)   Activity: As tolerated with Full fall precautions use walker/cane & assistance as needed   Disposition Home    Diet:   Heart Healthy .  For Heart failure patients - Check your Weight same time everyday, if you gain over 2 pounds, or you develop in leg swelling, experience more shortness of breath or chest pain, call your Primary MD immediately. Follow Cardiac Low Salt Diet and 1.5 lit/day fluid restriction.   On your next visit with your primary care physician please Get Medicines reviewed and adjusted.   Please request your Prim.MD to go over all Hospital Tests and Procedure/Radiological results at the follow up, please get all Hospital records sent to your Prim MD by signing hospital release before you go home.   If you experience worsening of your admission symptoms, develop shortness of breath, life threatening emergency, suicidal or homicidal thoughts you must seek medical attention immediately by calling 911 or calling your MD immediately  if symptoms less severe.  You Must read complete instructions/literature along with all the possible adverse reactions/side effects for all the Medicines you take and that have been prescribed to you. Take any new Medicines after you have completely understood and accpet all the possible adverse reactions/side effects.   Do not drive, operate heavy machinery, perform activities at heights, swimming or participation in water activities or provide baby sitting services if your were admitted for syncope or siezures until you have seen by Primary MD or a Neurologist and advised to do so again.  Do not drive when taking Pain medications.    Do not take more than prescribed Pain, Sleep and Anxiety Medications  Special Instructions: If you have smoked or chewed Tobacco  in the last 2 yrs please stop smoking, stop any regular Alcohol  and or  any Recreational drug use.  Wear Seat belts while driving.   Please note  You were cared for by a hospitalist during your hospital stay. If you have any questions about your discharge medications or the care you received while you were in the hospital after you are discharged, you can call the unit and asked to speak with the hospitalist on call if the hospitalist that took care of you is not available. Once you are discharged, your primary care physician will handle any further medical issues. Please note that NO REFILLS for any discharge medications will be authorized once you are discharged, as it is imperative that you return to your primary care physician (or establish a relationship with a primary care physician if you do not have one) for your aftercare needs so that they can reassess your need for medications and monitor  your lab values.     Increase activity slowly    Complete by:  As directed            Discharge Medications     Medication List    STOP taking these medications        hydrochlorothiazide 25 MG tablet  Commonly known as:  HYDRODIURIL      TAKE these medications        aspirin 81 MG chewable tablet  Chew 81 mg by mouth every morning.     atenolol 50 MG tablet  Commonly known as:  TENORMIN  TAKE 1 TABLET EVERY DAY (NEED OFFICE VISIT FOR MORE REFILLS)     calcitonin (salmon) 200 UNIT/ACT nasal spray  Commonly known as:  MIACALCIN/FORTICAL  Place 1 spray into alternate nostrils daily.     citalopram 20 MG tablet  Commonly known as:  CELEXA  TAKE  1  AND  1/2  TABLETS AT BEDTIME (NEED OFFICE VISIT FOR MORE REFILLS)     DULoxetine 60 MG capsule  Commonly known as:  CYMBALTA  Take 60 mg by mouth daily.     HYDROcodone-acetaminophen 5-325 MG tablet  Commonly known as:  NORCO/VICODIN  Take 1 tablet by mouth every 6 (six) hours as needed for severe pain.     losartan 50 MG tablet  Commonly known as:  COZAAR  Take 50 mg by mouth daily.      PROBIOTIC PO  Take 1 capsule by mouth daily.     REFRESH TEARS 0.5 % Soln  Generic drug:  carboxymethylcellulose  Apply 1 drop to eye daily as needed (dry eyes).     traZODone 50 MG tablet  Commonly known as:  DESYREL  Take 1 tablet (50 mg total) by mouth 2 (two) times daily.     VOLTAREN 1 % Gel  Generic drug:  diclofenac sodium  Apply 4 g topically 4 (four) times daily.        Allergies  Allergen Reactions  . Ace Inhibitors Cough  . Lactose Intolerance (Gi)         Follow-up Information    Follow up with TODD,JEFFREY ALLEN, MD. Schedule an appointment as soon as possible for a visit in 3 days.   Specialty:  Family Medicine   Why:  BMP check    Contact information:   [place] [address] [phone]       Follow up with Earnie Larsson A, MD. Schedule an appointment as soon as possible for a visit in 3 days.   Specialty:  Neurosurgery   Contact information:   [address] [place] [address] [phone]       Major procedures and Radiology Reports - PLEASE review detailed and final reports thoroughly  -         Dg Lumbar Spine Complete  07/21/2015  CLINICAL DATA:  Low back pain for the past 5 days radiating into the left hip without known injury EXAM: LUMBAR SPINE - COMPLETE 4+ VIEW COMPARISON:  None in PACs FINDINGS: There is moderate dextrocurvature of the lower thoracic and lumbar spine centered at L1-2. The pedicles and transverse processes of the lumbar spine are intact. There is no compression fracture or spondylolisthesis. There is moderate to severe degenerative narrowing of the L3-4 and L4-5 disc spaces with mild to moderate narrowing at L1-2 and L2-3. The L5-S1 disc space is normal. There is facet joint hypertrophy from L3-S1. IMPRESSION: Multilevel moderate to severe degenerative disc  and facet joint disease as described. No compression fracture. Mild levocurvature of the thoracolumbar spine. Move Electronically  Signed   By: David  Martinique M.D.   On: 07/21/2015 14:46   Dg Pelvis 1-2 Views  07/21/2015  CLINICAL DATA:  Five days of low back pain radiating into the left hip without known injury EXAM: PELVIS - 1-2 VIEW COMPARISON:  None in PACs FINDINGS: The bony pelvis is subjectively osteopenic. There is no lytic or blastic lesion or acute fracture. There is mild narrowing of the hip joint spaces bilaterally slightly greater on the right than on the left. The femoral necks, intertrochanteric, and subtrochanteric regions are grossly normal. The bowel gas pattern is unremarkable. IMPRESSION: There is mild to moderate osteoarthritic change of both hips. There is no acute bony abnormality of the pelvis. Electronically Signed   By: David  Martinique M.D.   On: 07/21/2015 14:47   Ct Lumbar Spine Wo Contrast  07/23/2015  CLINICAL DATA:  Abdominal pain. Low back pain with nausea and vomiting for 2 days. EXAM: CT LUMBAR SPINE WITHOUT CONTRAST TECHNIQUE: Multidetector CT imaging of the lumbar spine was performed without intravenous contrast administration. Multiplanar CT image reconstructions were also generated. COMPARISON:  Lumbar spine radiographs 07/21/2015. Two-view chest x-ray 06/04/2011. FINDINGS: The lumbar spine is imaged from the midbody of T11 through S2. Acute superior endplate fractures present at T12 without significant retropulsion of bone. Paraspinal hemorrhage is noted without displacement of the aorta. Atherosclerotic calcifications are present in the aorta without aneurysm. There is no significant adenopathy or other focal lesion. Rightward curvature of the thoracolumbar spine is centered at T12-L1. There is leftward curvature at L4-5. Chronic endplate changes are present on the right at L3-4 and L4-5. There chronic endplate changes on the left at T12-L1. Bone island is present at L3. T12-L1: A leftward disc protrusion is present. Left foraminal stenosis is present. L1-2: A broad-based disc protrusion is asymmetric  to the left. Left foraminal narrowing is present. L2-3: A broad-based disc protrusion is present. Moderate facet hypertrophy is noted bilaterally. Mild left foraminal narrowing is present. L3-4: There is uncovering of a broad-based disc protrusion. Advanced facet hypertrophy and spurring is noted this leads to moderate to severe central and bilateral foraminal stenosis, left greater than right. L4-5: A broad-based disc protrusion is present. Advanced facet hypertrophy is noted. Spurring leads to moderate foraminal stenosis bilaterally, left greater than right. L5-S1: A shallow central disc protrusion is present. Moderate facet hypertrophy is noted without significant stenosis. IMPRESSION: 1. Superior endplate compression fracture at T12 with 40% loss of height but no retropulsion of bone. 2. Rightward curvature of the lumbar spine centered at T12-L1. 3. Leftward curvature centered at L4. 4. Left foraminal stenosis at T12-L1, L1-2, and L2-3. 5. Moderate to severe central and bilateral foraminal narrowing at L3-4, left greater than right. 6. Moderate foraminal stenosis bilaterally at L4-5 is worse on the left. Electronically Signed   By: San Morelle M.D.   On: 07/23/2015 19:24   Ct Abdomen Pelvis W Contrast  07/23/2015  CLINICAL DATA:  Abdominal pain with nausea and vomiting for 2 days. EXAM: CT ABDOMEN AND PELVIS WITH CONTRAST TECHNIQUE: Multidetector CT imaging of the abdomen and pelvis was performed using the standard protocol following bolus administration of intravenous contrast. CONTRAST:  145mL ISOVUE-300 IOPAMIDOL (ISOVUE-300) INJECTION 61% COMPARISON:  None. FINDINGS: Lower chest:  Scarring noted posterior medial right lower lobe. Hepatobiliary: No focal abnormality within the liver parenchyma. There is no evidence for gallstones, gallbladder wall thickening,  or pericholecystic fluid. No intrahepatic or extrahepatic biliary dilation. Pancreas: No focal mass lesion. No dilatation of the main duct.  No intraparenchymal cyst. No peripancreatic edema. Spleen: No splenomegaly. No focal mass lesion. Adrenals/Urinary Tract: No adrenal nodule or mass. Tiny cortical hypo attenuating lesions in the kidneys bilaterally are too small to characterize but likely represent cysts. No evidence for hydroureter. The urinary bladder appears normal for the degree of distention. Stomach/Bowel: Small hiatal hernia. Stomach otherwise unremarkable. Duodenum is normally positioned as is the ligament of Treitz. No small bowel wall thickening. No small bowel dilatation. Terminal ileum is normal. The appendix is not visualized, but there is no edema or inflammation in the region of the cecum. Diverticular changes are noted in the left colon without evidence of diverticulitis. Vascular/Lymphatic: There is abdominal aortic atherosclerosis without aneurysm. There is no gastrohepatic or hepatoduodenal ligament lymphadenopathy. No intraperitoneal or retroperitoneal lymphadenopathy. No pelvic sidewall lymphadenopathy. Reproductive: The uterus is surgically absent. There is no adnexal mass. Other: No intraperitoneal free fluid. Musculoskeletal: Pelvic floor laxity noted. 8 mm sclerotic lesion left iliac crest presumably a bone island. No other worrisome lytic or sclerotic osseous abnormality. Similar 7 mm sclerotic lesion L3 vertebral body likely a bone island compression fracture noted at T12 is likely acute and results and anterior loss of vertebral body height. IMPRESSION: 1. No acute intraperitoneal abnormality of the abdomen or pelvis. 2. T12 compression fracture appears acute by CT imaging. 3. Small hiatal hernia. 4. Pelvic floor laxity. Imaging findings of potential clinical significance: Abdominal Aortic Atherosclerois (ICD10-170.0) Electronically Signed   By: Misty Stanley M.D.   On: 07/23/2015 19:20   Ct Hip Left Wo Contrast  07/24/2015  CLINICAL DATA:  Left hip pain since last Thursday.  No known injury. EXAM: CT OF THE LEFT HIP  WITHOUT CONTRAST TECHNIQUE: Multidetector CT imaging of the left hip was performed according to the standard protocol. Multiplanar CT image reconstructions were also generated. COMPARISON:  Radiographs 07/21/2015 FINDINGS: Contrast is noted in the bladder likely from previous imaging study. The left hip is normally located. There are advanced degenerative changes with joint space narrowing, osteophytic spurring and subchondral cystic change. No fracture or AVN. The visualized left hemipelvis bony structures are intact. There are degenerative changes noted at the pubic symphysis. No pubic rami fractures. The left hip and pelvic musculature appear normal. No obvious mass, hematoma or edema. IMPRESSION: Advanced left hip joint degenerative changes but no acute bony findings or evidence of AVN. If symptoms persist MR may be helpful for further evaluation, particularly for a subtle stress fracture or soft tissue abnormality. Electronically Signed   By: Marijo Sanes M.D.   On: 07/24/2015 11:20    Micro Results      Recent Results (from the past 240 hour(s))  Gastrointestinal Panel by PCR , Stool     Status: None   Collection Time: 07/24/15  5:05 AM  Result Value Ref Range Status   Campylobacter species NOT DETECTED NOT DETECTED Final   Plesimonas shigelloides NOT DETECTED NOT DETECTED Final   Salmonella species NOT DETECTED NOT DETECTED Final   Yersinia enterocolitica NOT DETECTED NOT DETECTED Final   Vibrio species NOT DETECTED NOT DETECTED Final   Vibrio cholerae NOT DETECTED NOT DETECTED Final   Enteroaggregative E coli (EAEC) NOT DETECTED NOT DETECTED Final   Enteropathogenic E coli (EPEC) NOT DETECTED NOT DETECTED Final   Enterotoxigenic E coli (ETEC) NOT DETECTED NOT DETECTED Final   Shiga like toxin producing E coli (STEC) NOT  DETECTED NOT DETECTED Final   E. coli O157 NOT DETECTED NOT DETECTED Final   Shigella/Enteroinvasive E coli (EIEC) NOT DETECTED NOT DETECTED Final   Cryptosporidium NOT  DETECTED NOT DETECTED Final   Cyclospora cayetanensis NOT DETECTED NOT DETECTED Final   Entamoeba histolytica NOT DETECTED NOT DETECTED Final   Giardia lamblia NOT DETECTED NOT DETECTED Final   Adenovirus F40/41 NOT DETECTED NOT DETECTED Final   Astrovirus NOT DETECTED NOT DETECTED Final   Norovirus GI/GII NOT DETECTED NOT DETECTED Final   Rotavirus A NOT DETECTED NOT DETECTED Final   Sapovirus (I, II, IV, and V) NOT DETECTED NOT DETECTED Final    Today   Subjective    [name] today has no headache,no chest abdominal pain,no new weakness tingling or numbness, feels much better wants to go home today.     Objective   Blood pressure 148/75, pulse 63, temperature 98 F (36.7 C), temperature source Oral, resp. rate 16, height 5' 3.5" (1.613 m), weight 67.1 kg (147 lb 14.9 oz), SpO2 97 %.   Intake/Output Summary (Last 24 hours) at 07/24/15 1318 Last data filed at 07/24/15 1029  Gross per 24 hour  Intake   1680 ml  Output   2400 ml  Net   -720 ml    Exam  Awake Alert, Oriented x 3, No new F.N deficits, Normal affect [place].AT,PERRAL Supple Neck,No JVD, No cervical lymphadenopathy appriciated.  Symmetrical Chest wall movement, Good air movement bilaterally, CTAB RRR,No Gallops,Rubs or new Murmurs, No Parasternal Heave +ve B.Sounds, Abd Soft, Non tender, No organomegaly appriciated, No rebound -guarding or rigidity. No Cyanosis, Clubbing or edema, No new Rash or bruise    Data Review   CBC w Diff: Lab Results  Component Value Date   WBC 10.3 07/24/2015   HGB 12.8 07/24/2015   HCT 37.3 07/24/2015   PLT 287 07/24/2015   LYMPHOPCT 21 07/24/2015   MONOPCT 13 07/24/2015   EOSPCT 1 07/24/2015   BASOPCT 0 07/24/2015    CMP: Lab Results  Component Value Date   NA 129* 07/24/2015   K 3.1* 07/24/2015   CL 95* 07/24/2015   CO2 25 07/24/2015   BUN 13 07/24/2015   CREATININE 0.68 07/24/2015   PROT 6.2* 07/24/2015   ALBUMIN 3.6 07/24/2015   BILITOT 0.8 07/24/2015    ALKPHOS 77 07/24/2015   AST 16 07/24/2015   ALT 11* 07/24/2015  .   Total Time in preparing paper work, data evaluation and todays exam - 35 minutes  Thurnell Lose M.D on 07/24/2015 at 1:18 PM  [facility]   Office  [phone]

## 2015-07-24 NOTE — Progress Notes (Signed)
Patient had trouble voiding over the night but was able to urinate for [place] today. She urinated several times. Checked PVR and 34ml.   Education provided about new medications.   Patient opted to have all DME equipment delivered to home. She was aware of the safety risk of not having walker.

## 2015-07-24 NOTE — Evaluation (Signed)
Physical Therapy Evaluation Patient Details Name: [name] MRN: [id number] DOB: September 01, 1921 Today's Date: 07/24/2015   History of Present Illness  82 yo female admitted with hyponatremia, t12 comp fx, L hip pain. Hx of HTN, arthritis, endometrial cancer.   Clinical Impression  On eval, pt required Min assist for mobility-walked ~23 feet with RW. Distance limited by pain, fatigue. Pt is at risk for falls. Pt lives in 2 level home-she is currently unable to safely climb a flight of stairs to get up to bedroom. Discussed d/c plan-family requesting to speak with CSW to find out about options for ST rehab at [facility]. If pt returns home she will require a hospital bed, BSC, and RW. Daughter states they already have a transport chair.     Follow Up Recommendations SNF;Supervision/Assistance - 24 hour (HHPT if pt/family decide to return home)    Equipment Recommendations  Rolling walker with 5" wheels;Hospital bed;3in1     Recommendations for Other Services       Precautions / Restrictions Precautions Precautions: Fall Restrictions Weight Bearing Restrictions: No      Mobility  Bed Mobility Overal bed mobility: Needs Assistance Bed Mobility: Supine to Sit;Sit to Supine     Supine to sit: HOB elevated;Min assist Sit to supine: Min guard   General bed mobility comments: Assist for trunk to upright.   Transfers Overall transfer level: Needs assistance Equipment used: Rolling walker (2 wheeled) Transfers: Sit to/from Stand Sit to Stand: Min assist         General transfer comment: Assist to rise, stabilize, control descent. VCs safety, technique, hand placement.   Ambulation/Gait Ambulation/Gait assistance: Min assist Ambulation Distance (Feet): 23 Feet Assistive device: Rolling walker (2 wheeled) Gait Pattern/deviations: Step-to pattern;Antalgic;Trunk flexed     General Gait Details: Assist to stabilize pt and maneuver with RW. 1 standing rest break after ~10 feet. Distance  limited by pain. Pt is unsteady and is at risk for falls.   Stairs            Wheelchair Mobility    Modified Rankin (Stroke Patients Only)       Balance Overall balance assessment: Needs assistance         Standing balance support: During functional activity Standing balance-Leahy Scale: Poor                               Pertinent Vitals/Pain Pain Assessment: 0-10 Pain Score: 8  Pain Location: L hip with ambulation Pain Descriptors / Indicators: Sharp Pain Intervention(s): Limited activity within patient's tolerance;Repositioned    Home Living Family/patient expects to be discharged to:: Private residence Living Arrangements: Children Available Help at Discharge: Family Type of Home: House Home Access: Stairs to enter Entrance Stairs-Rails:  (post to hold on to) Technical brewer of Steps: 2 Home Layout: Two level Home Equipment: Cane - single point;Transport chair      Prior Function Level of Independence: Independent with assistive device(s)         Comments: uses cane for ambulation     Hand Dominance        Extremity/Trunk Assessment   Upper Extremity Assessment: Generalized weakness           Lower Extremity Assessment: Generalized weakness      Cervical / Trunk Assessment: Kyphotic  Communication   Communication: No difficulties  Cognition Arousal/Alertness: Awake/alert Behavior During Therapy: WFL for tasks assessed/performed Overall Cognitive Status: Within Functional Limits for tasks assessed  General Comments      Exercises        Assessment/Plan    PT Assessment Patient needs continued PT services  PT Diagnosis Difficulty walking;Abnormality of gait;Generalized weakness;Acute pain   PT Problem List Decreased strength;Decreased activity tolerance;Decreased balance;Decreased mobility;Pain;Decreased knowledge of use of DME  PT Treatment Interventions DME  instruction;Gait training;Functional mobility training;Therapeutic activities;Patient/family education;Therapeutic exercise;Balance training   PT Goals (Current goals can be found in the Care Plan section) Acute Rehab PT Goals Patient Stated Goal: less pain PT Goal Formulation: With patient/family Time For Goal Achievement: 08/07/15 Potential to Achieve Goals: Good    Frequency Min 3X/week   Barriers to discharge        Co-evaluation               End of Session Equipment Utilized During Treatment: Gait belt Activity Tolerance: Patient limited by fatigue;Patient limited by pain Patient left: in bed;with call bell/phone within reach;with family/visitor present      Functional Assessment Tool Used: clinical judgement Functional Limitation: Mobility: Walking and moving around Mobility: Walking and Moving Around Current Status [id number]): At least 20 percent but less than 40 percent impaired, limited or restricted Mobility: Walking and Moving Around Goal Status [phone]): At least 1 percent but less than 20 percent impaired, limited or restricted    Time: 1338-1400 PT Time Calculation (min) (ACUTE ONLY): 22 min   Charges:   PT Evaluation $PT Eval Low Complexity: 1 Procedure     PT G Codes:   PT G-Codes **NOT FOR INPATIENT CLASS** Functional Assessment Tool Used: clinical judgement Functional Limitation: Mobility: Walking and moving around Mobility: Walking and Moving Around Current Status [id number]): At least 20 percent but less than 40 percent impaired, limited or restricted Mobility: Walking and Moving Around Goal Status [phone]): At least 1 percent but less than 20 percent impaired, limited or restricted    Weston Anna, MPT Pager: [phone]

## 2015-07-24 NOTE — Care Management Note (Signed)
Case Management Note  Patient Details  Name: [name] MRN: [id number] Date of Birth: [birth year]  Subjective/Objective: 82 y/o f admitted w/hyponatremia. From home. Await PT recc.                   Action/Plan:d/c plan home.   Expected Discharge Date:                Expected Discharge Plan:  Home/Self Care  In-House Referral:     Discharge planning Services  CM Consult  Post Acute Care Choice:    Choice offered to:     DME Arranged:    DME Agency:     HH Arranged:    HH Agency:     Status of Service:  In process, will continue to follow  If discussed at Long Length of Stay Meetings, dates discussed:    Additional Comments:  Dessa Phi, RN 07/24/2015, 12:39 PM

## 2015-07-24 NOTE — Discharge Instructions (Signed)
Follow with Primary MD TODD,JEFFREY ALLEN, MD in 2-3 days   Get CBC, CMP, 2 view Chest X ray checked  by Primary MD or SNF MD in 5-7 days ( we routinely change or add medications that can affect your baseline labs and fluid status, therefore we recommend that you get the mentioned basic workup next visit with your PCP, your PCP may decide not to get them or add new tests based on their clinical decision)   Activity: As tolerated with Full fall precautions use walker/cane & assistance as needed   Disposition Home    Diet:   Heart Healthy .  For Heart failure patients - Check your Weight same time everyday, if you gain over 2 pounds, or you develop in leg swelling, experience more shortness of breath or chest pain, call your Primary MD immediately. Follow Cardiac Low Salt Diet and 1.5 lit/day fluid restriction.   On your next visit with your primary care physician please Get Medicines reviewed and adjusted.   Please request your Prim.MD to go over all Hospital Tests and Procedure/Radiological results at the follow up, please get all Hospital records sent to your Prim MD by signing hospital release before you go home.   If you experience worsening of your admission symptoms, develop shortness of breath, life threatening emergency, suicidal or homicidal thoughts you must seek medical attention immediately by calling 911 or calling your MD immediately  if symptoms less severe.  You Must read complete instructions/literature along with all the possible adverse reactions/side effects for all the Medicines you take and that have been prescribed to you. Take any new Medicines after you have completely understood and accpet all the possible adverse reactions/side effects.   Do not drive, operate heavy machinery, perform activities at heights, swimming or participation in water activities or provide baby sitting services if your were admitted for syncope or siezures until you have seen by Primary MD  or a Neurologist and advised to do so again.  Do not drive when taking Pain medications.    Do not take more than prescribed Pain, Sleep and Anxiety Medications  Special Instructions: If you have smoked or chewed Tobacco  in the last 2 yrs please stop smoking, stop any regular Alcohol  and or any Recreational drug use.  Wear Seat belts while driving.   Please note  You were cared for by a hospitalist during your hospital stay. If you have any questions about your discharge medications or the care you received while you were in the hospital after you are discharged, you can call the unit and asked to speak with the hospitalist on call if the hospitalist that took care of you is not available. Once you are discharged, your primary care physician will handle any further medical issues. Please note that NO REFILLS for any discharge medications will be authorized once you are discharged, as it is imperative that you return to your primary care physician (or establish a relationship with a primary care physician if you do not have one) for your aftercare needs so that they can reassess your need for medications and monitor your lab values.

## 2015-07-26 DIAGNOSIS — K5790 Diverticulosis of intestine, part unspecified, without perforation or abscess without bleeding: Secondary | ICD-10-CM | POA: Diagnosis not present

## 2015-07-26 DIAGNOSIS — Z8542 Personal history of malignant neoplasm of other parts of uterus: Secondary | ICD-10-CM | POA: Diagnosis not present

## 2015-07-26 DIAGNOSIS — Z7982 Long term (current) use of aspirin: Secondary | ICD-10-CM | POA: Diagnosis not present

## 2015-07-26 DIAGNOSIS — I10 Essential (primary) hypertension: Secondary | ICD-10-CM | POA: Diagnosis not present

## 2015-07-26 DIAGNOSIS — F329 Major depressive disorder, single episode, unspecified: Secondary | ICD-10-CM | POA: Diagnosis not present

## 2015-07-26 DIAGNOSIS — M8448XD Pathological fracture, other site, subsequent encounter for fracture with routine healing: Secondary | ICD-10-CM | POA: Diagnosis not present

## 2015-07-26 DIAGNOSIS — F419 Anxiety disorder, unspecified: Secondary | ICD-10-CM | POA: Diagnosis not present

## 2015-07-26 DIAGNOSIS — M8080XD Other osteoporosis with current pathological fracture, unspecified site, subsequent encounter for fracture with routine healing: Secondary | ICD-10-CM | POA: Diagnosis not present

## 2015-07-27 DIAGNOSIS — K5790 Diverticulosis of intestine, part unspecified, without perforation or abscess without bleeding: Secondary | ICD-10-CM | POA: Diagnosis not present

## 2015-07-27 DIAGNOSIS — F419 Anxiety disorder, unspecified: Secondary | ICD-10-CM | POA: Diagnosis not present

## 2015-07-27 DIAGNOSIS — M8448XD Pathological fracture, other site, subsequent encounter for fracture with routine healing: Secondary | ICD-10-CM | POA: Diagnosis not present

## 2015-07-27 DIAGNOSIS — I10 Essential (primary) hypertension: Secondary | ICD-10-CM | POA: Diagnosis not present

## 2015-07-27 DIAGNOSIS — M8080XD Other osteoporosis with current pathological fracture, unspecified site, subsequent encounter for fracture with routine healing: Secondary | ICD-10-CM | POA: Diagnosis not present

## 2015-07-27 DIAGNOSIS — F329 Major depressive disorder, single episode, unspecified: Secondary | ICD-10-CM | POA: Diagnosis not present

## 2015-07-28 DIAGNOSIS — I10 Essential (primary) hypertension: Secondary | ICD-10-CM | POA: Diagnosis not present

## 2015-07-28 DIAGNOSIS — R7989 Other specified abnormal findings of blood chemistry: Secondary | ICD-10-CM | POA: Diagnosis not present

## 2015-07-29 ENCOUNTER — Emergency Department (HOSPITAL_COMMUNITY): Payer: Medicare Other

## 2015-07-29 ENCOUNTER — Inpatient Hospital Stay (HOSPITAL_COMMUNITY)
Admission: EM | Admit: 2015-07-29 | Discharge: 2015-08-01 | DRG: 640 | Disposition: A | Discharge status: Skilled Nursing Facility | Payer: Medicare Other | Attending: Internal Medicine | Admitting: Internal Medicine

## 2015-07-29 ENCOUNTER — Encounter (HOSPITAL_COMMUNITY): Payer: Self-pay | Admitting: *Deleted

## 2015-07-29 DIAGNOSIS — F419 Anxiety disorder, unspecified: Secondary | ICD-10-CM | POA: Diagnosis present

## 2015-07-29 DIAGNOSIS — Z7982 Long term (current) use of aspirin: Secondary | ICD-10-CM | POA: Diagnosis not present

## 2015-07-29 DIAGNOSIS — G92 Toxic encephalopathy: Secondary | ICD-10-CM | POA: Diagnosis present

## 2015-07-29 DIAGNOSIS — Z8542 Personal history of malignant neoplasm of other parts of uterus: Secondary | ICD-10-CM | POA: Diagnosis not present

## 2015-07-29 DIAGNOSIS — R410 Disorientation, unspecified: Secondary | ICD-10-CM | POA: Diagnosis not present

## 2015-07-29 DIAGNOSIS — M6281 Muscle weakness (generalized): Secondary | ICD-10-CM | POA: Diagnosis not present

## 2015-07-29 DIAGNOSIS — S22000A Wedge compression fracture of unspecified thoracic vertebra, initial encounter for closed fracture: Secondary | ICD-10-CM | POA: Diagnosis present

## 2015-07-29 DIAGNOSIS — B3781 Candidal esophagitis: Secondary | ICD-10-CM

## 2015-07-29 DIAGNOSIS — F32A Depression, unspecified: Secondary | ICD-10-CM | POA: Diagnosis present

## 2015-07-29 DIAGNOSIS — G9340 Encephalopathy, unspecified: Secondary | ICD-10-CM | POA: Diagnosis not present

## 2015-07-29 DIAGNOSIS — F329 Major depressive disorder, single episode, unspecified: Secondary | ICD-10-CM | POA: Diagnosis present

## 2015-07-29 DIAGNOSIS — Z789 Other specified health status: Secondary | ICD-10-CM | POA: Diagnosis not present

## 2015-07-29 DIAGNOSIS — M8080XD Other osteoporosis with current pathological fracture, unspecified site, subsequent encounter for fracture with routine healing: Secondary | ICD-10-CM | POA: Diagnosis not present

## 2015-07-29 DIAGNOSIS — Z66 Do not resuscitate: Secondary | ICD-10-CM | POA: Diagnosis present

## 2015-07-29 DIAGNOSIS — N390 Urinary tract infection, site not specified: Secondary | ICD-10-CM | POA: Diagnosis present

## 2015-07-29 DIAGNOSIS — I160 Hypertensive urgency: Secondary | ICD-10-CM | POA: Diagnosis present

## 2015-07-29 DIAGNOSIS — K5792 Diverticulitis of intestine, part unspecified, without perforation or abscess without bleeding: Secondary | ICD-10-CM | POA: Diagnosis present

## 2015-07-29 DIAGNOSIS — Z7189 Other specified counseling: Secondary | ICD-10-CM | POA: Insufficient documentation

## 2015-07-29 DIAGNOSIS — E860 Dehydration: Secondary | ICD-10-CM | POA: Diagnosis present

## 2015-07-29 DIAGNOSIS — Z515 Encounter for palliative care: Secondary | ICD-10-CM | POA: Insufficient documentation

## 2015-07-29 DIAGNOSIS — K529 Noninfective gastroenteritis and colitis, unspecified: Secondary | ICD-10-CM | POA: Diagnosis not present

## 2015-07-29 DIAGNOSIS — B370 Candidal stomatitis: Secondary | ICD-10-CM | POA: Diagnosis present

## 2015-07-29 DIAGNOSIS — G9341 Metabolic encephalopathy: Secondary | ICD-10-CM

## 2015-07-29 DIAGNOSIS — R030 Elevated blood-pressure reading, without diagnosis of hypertension: Secondary | ICD-10-CM | POA: Diagnosis not present

## 2015-07-29 DIAGNOSIS — R531 Weakness: Secondary | ICD-10-CM

## 2015-07-29 DIAGNOSIS — R41841 Cognitive communication deficit: Secondary | ICD-10-CM | POA: Diagnosis not present

## 2015-07-29 DIAGNOSIS — R159 Full incontinence of feces: Secondary | ICD-10-CM | POA: Insufficient documentation

## 2015-07-29 DIAGNOSIS — R262 Difficulty in walking, not elsewhere classified: Secondary | ICD-10-CM | POA: Diagnosis not present

## 2015-07-29 DIAGNOSIS — E871 Hypo-osmolality and hyponatremia: Principal | ICD-10-CM | POA: Diagnosis present

## 2015-07-29 DIAGNOSIS — E869 Volume depletion, unspecified: Secondary | ICD-10-CM | POA: Diagnosis present

## 2015-07-29 DIAGNOSIS — M4854XA Collapsed vertebra, not elsewhere classified, thoracic region, initial encounter for fracture: Secondary | ICD-10-CM | POA: Diagnosis present

## 2015-07-29 DIAGNOSIS — R4182 Altered mental status, unspecified: Secondary | ICD-10-CM | POA: Diagnosis not present

## 2015-07-29 DIAGNOSIS — I10 Essential (primary) hypertension: Secondary | ICD-10-CM | POA: Diagnosis present

## 2015-07-29 DIAGNOSIS — F331 Major depressive disorder, recurrent, moderate: Secondary | ICD-10-CM | POA: Diagnosis not present

## 2015-07-29 DIAGNOSIS — E739 Lactose intolerance, unspecified: Secondary | ICD-10-CM | POA: Diagnosis present

## 2015-07-29 DIAGNOSIS — S22000D Wedge compression fracture of unspecified thoracic vertebra, subsequent encounter for fracture with routine healing: Secondary | ICD-10-CM | POA: Diagnosis not present

## 2015-07-29 DIAGNOSIS — M8448XD Pathological fracture, other site, subsequent encounter for fracture with routine healing: Secondary | ICD-10-CM | POA: Diagnosis not present

## 2015-07-29 DIAGNOSIS — R278 Other lack of coordination: Secondary | ICD-10-CM | POA: Diagnosis not present

## 2015-07-29 DIAGNOSIS — K5790 Diverticulosis of intestine, part unspecified, without perforation or abscess without bleeding: Secondary | ICD-10-CM | POA: Diagnosis not present

## 2015-07-29 LAB — URINALYSIS, ROUTINE W REFLEX MICROSCOPIC
BILIRUBIN URINE: NEGATIVE
GLUCOSE, UA: NEGATIVE mg/dL
KETONES UR: 15 mg/dL — AB
Nitrite: POSITIVE — AB
PROTEIN: NEGATIVE mg/dL
Specific Gravity, Urine: 1.02 (ref 1.005–1.030)
pH: 6.5 (ref 5.0–8.0)

## 2015-07-29 LAB — BASIC METABOLIC PANEL
Anion gap: 10 (ref 5–15)
Anion gap: 6 (ref 5–15)
BUN: 13 mg/dL (ref 6–20)
BUN: 13 mg/dL (ref 6–20)
CHLORIDE: 92 mmol/L — AB (ref 101–111)
CO2: 28 mmol/L (ref 22–32)
CO2: 29 mmol/L (ref 22–32)
CREATININE: 1.04 mg/dL — AB (ref 0.44–1.00)
CREATININE: 0.86 mg/dL (ref 0.44–1.00)
Calcium: 9.9 mg/dL (ref 8.9–10.3)
Calcium: 9.2 mg/dL (ref 8.9–10.3)
Chloride: 94 mmol/L — ABNORMAL LOW (ref 101–111)
GFR calc Af Amer: >60 mL/min (ref >60)
GFR calc non Af Amer: 45 mL/min — ABNORMAL LOW (ref >60)
GFR, EST AFRICAN AMERICAN: 52 mL/min — AB (ref >60)
GFR, EST NON AFRICAN AMERICAN: 57 mL/min — AB (ref >60)
GLUCOSE: 125 mg/dL — AB (ref 65–99)
Glucose, Bld: 133 mg/dL — ABNORMAL HIGH (ref 65–99)
POTASSIUM: 3.6 mmol/L (ref 3.5–5.1)
POTASSIUM: 3.6 mmol/L (ref 3.5–5.1)
SODIUM: 130 mmol/L — AB (ref 135–145)
SODIUM: 129 mmol/L — AB (ref 135–145)

## 2015-07-29 LAB — CBC
HCT: 40.4 % (ref 36.0–46.0)
Hemoglobin: 13.8 g/dL (ref 12.0–15.0)
MCH: 30.3 pg (ref 26.0–34.0)
MCHC: 34.2 g/dL (ref 30.0–36.0)
MCV: 88.6 fL (ref 78.0–100.0)
PLATELETS: 335 10*3/uL (ref 150–400)
RBC: 4.56 MIL/uL (ref 3.87–5.11)
RDW: 13.7 % (ref 11.5–15.5)
WBC: 13 10*3/uL — AB (ref 4.0–10.5)

## 2015-07-29 LAB — PHOSPHORUS: Phosphorus: 3 mg/dL (ref 2.5–4.6)

## 2015-07-29 LAB — URINE MICROSCOPIC-ADD ON

## 2015-07-29 LAB — MAGNESIUM: MAGNESIUM: 1.6 mg/dL — AB (ref 1.7–2.4)

## 2015-07-29 MED ORDER — DULOXETINE HCL 60 MG PO CPEP
60.0000 mg | ORAL_CAPSULE | Freq: Every day | ORAL | Status: DC
  Administered 2015-07-29 – 2015-08-01 (×3): 60 mg via ORAL
  Filled 2015-07-29 (×4): qty 1

## 2015-07-29 MED ORDER — SODIUM CHLORIDE 0.9 % IV BOLUS (SEPSIS)
500.0000 mL | Freq: Once | INTRAVENOUS | Status: AC
  Administered 2015-07-29: 500 mL via INTRAVENOUS

## 2015-07-29 MED ORDER — LOSARTAN POTASSIUM 50 MG PO TABS
100.0000 mg | ORAL_TABLET | Freq: Every day | ORAL | Status: DC
  Administered 2015-07-31 – 2015-08-01 (×2): 100 mg via ORAL
  Filled 2015-07-29 (×4): qty 2

## 2015-07-29 MED ORDER — TRAZODONE HCL 50 MG PO TABS
75.0000 mg | ORAL_TABLET | Freq: Every day | ORAL | Status: DC
  Administered 2015-07-29 – 2015-07-31 (×3): 75 mg via ORAL
  Filled 2015-07-29 (×3): qty 2

## 2015-07-29 MED ORDER — FAMOTIDINE IN NACL 20-0.9 MG/50ML-% IV SOLN
20.0000 mg | Freq: Two times a day (BID) | INTRAVENOUS | Status: DC
  Administered 2015-07-29 – 2015-07-30 (×2): 20 mg via INTRAVENOUS
  Filled 2015-07-29 (×3): qty 50

## 2015-07-29 MED ORDER — ACETAMINOPHEN 650 MG RE SUPP: 650.0000 mg | Freq: Four times a day (QID) | RECTAL | Status: DC | PRN

## 2015-07-29 MED ORDER — WHITE PETROLATUM GEL
Status: AC
Start: 2015-07-29 — End: 2015-07-29
  Administered 2015-07-29: 0.2
  Filled 2015-07-29: qty 1

## 2015-07-29 MED ORDER — KETOROLAC TROMETHAMINE 15 MG/ML IJ SOLN
15.0000 mg | Freq: Two times a day (BID) | INTRAMUSCULAR | Status: DC
  Administered 2015-07-29 – 2015-07-31 (×4): 15 mg via INTRAVENOUS
  Filled 2015-07-29 (×4): qty 1

## 2015-07-29 MED ORDER — POLYVINYL ALCOHOL 1.4 % OP SOLN
1.0000 [drp] | OPHTHALMIC | Status: DC | PRN
  Filled 2015-07-29: qty 15

## 2015-07-29 MED ORDER — ATENOLOL 50 MG PO TABS
50.0000 mg | ORAL_TABLET | Freq: Every day | ORAL | Status: DC
  Filled 2015-07-29: qty 1

## 2015-07-29 MED ORDER — SODIUM CHLORIDE 0.9 % IV SOLN
INTRAVENOUS | Status: DC
  Administered 2015-07-29 – 2015-08-01 (×5): via INTRAVENOUS

## 2015-07-29 MED ORDER — ACETAMINOPHEN 325 MG PO TABS
650.0000 mg | ORAL_TABLET | Freq: Four times a day (QID) | ORAL | Status: DC | PRN
  Administered 2015-07-31: 650 mg via ORAL
  Filled 2015-07-29: qty 2

## 2015-07-29 MED ORDER — CALCITONIN (SALMON) 200 UNIT/ACT NA SOLN
1.0000 | Freq: Every day | NASAL | Status: DC
  Administered 2015-07-29 – 2015-08-01 (×4): 1 via NASAL
  Filled 2015-07-29: qty 3.7

## 2015-07-29 MED ORDER — LORAZEPAM 2 MG/ML IJ SOLN
1.0000 mg | Freq: Once | INTRAMUSCULAR | Status: AC
  Administered 2015-07-29: 1 mg via INTRAVENOUS
  Filled 2015-07-29: qty 1

## 2015-07-29 MED ORDER — ENOXAPARIN SODIUM 40 MG/0.4ML ~~LOC~~ SOLN
40.0000 mg | SUBCUTANEOUS | Status: DC
  Administered 2015-07-29 – 2015-07-31 (×3): 40 mg via SUBCUTANEOUS
  Filled 2015-07-29 (×3): qty 0.4

## 2015-07-29 MED ORDER — DEXTROSE 5 % IV SOLN
1.0000 g | INTRAVENOUS | Status: DC
  Administered 2015-07-29 – 2015-07-31 (×3): 1 g via INTRAVENOUS
  Filled 2015-07-29 (×4): qty 10

## 2015-07-29 MED ORDER — CARBOXYMETHYLCELLULOSE SODIUM 0.5 % OP SOLN: 1.0000 [drp] | Freq: Every day | OPHTHALMIC | Status: DC | PRN

## 2015-07-29 MED ORDER — LIDOCAINE 5 % EX PTCH
1.0000 | MEDICATED_PATCH | CUTANEOUS | Status: DC
  Administered 2015-07-29 – 2015-07-31 (×3): 1 via TRANSDERMAL
  Filled 2015-07-29 (×3): qty 1

## 2015-07-29 NOTE — ED Provider Notes (Signed)
CSN: [id number]     Arrival date & time 07/29/15  1350 History   First MD Initiated Contact with Patient 07/29/15 1406     Chief Complaint  Patient presents with  . Abnormal Lab     (Consider location/radiation/quality/duration/timing/severity/associated sxs/prior Treatment) HPI 82 year old female who presents today with her daughter who is the historian. She recently had a compression fracture in her low back. She has been home taking Vicodin and moving around some but has had increased confusion and decreased by mouth intake. She has been seen by her primary care doctor and had labs drawn yesterday. The daughter was called today and told that the sodium was low and they should bring her into the emergency department. They deny that she has fallen or had any head injury. She denies any visual change, chest pain, cough, fever, or chills. She has had decreased appetite and decreased by mouth intake. Urination has been normal. She has had episodic diarrhea in the mornings. This has been going on for over 6 months and has not changed. Past Medical History  Diagnosis Date  . Hypertension   . Diverticulitis   . Depression   . Anxiety   . Cancer [facility])     ENDOMETRIAL CANCER  . Arthritis     Hands/knees/hip/lower back - no meds   Past Surgical History  Procedure Laterality Date  . Colonscopy  2006  . Wisdom tooth extraction    . Svd      x 1  . Eye surgery      Bilateral cataract eye surgery  . Abdominal surgery      removed fibroid and one fallopian tube removed  . Appendectomy    . Dilation and curettage of uterus    . Hysteroscopy w/d&c  04/28/2011    Procedure: DILATATION AND CURETTAGE /HYSTEROSCOPY;  Surgeon: Gus Height, MD;  Location: [facility];  Service: Gynecology;  Laterality: N/A;  . Robotic assisted lap vaginal hysterectomy  06/08/2011    Procedure: ROBOTIC ASSISTED LAPAROSCOPIC VAGINAL HYSTERECTOMY;  Surgeon: Imagene Gurney A. Alycia Rossetti, MD;  Location: [facility];  Service: Gynecology;   Laterality: N/A;  . Lymph node dissection  06/08/2011    Procedure: LYMPH NODE DISSECTION;  Surgeon: Imagene Gurney A. Alycia Rossetti, MD;  Location: [facility];  Service: Gynecology;  Laterality: N/A;  Possible Lymph Nodes  . Abdominal hysterectomy     Family History  Problem Relation Age of Onset  . Breast cancer Sister   . Lung cancer Brother    Social History  Substance Use Topics  . Smoking status: Never Smoker   . Smokeless tobacco: Never Used  . Alcohol Use: No   OB History    No data available     Review of Systems  Constitutional: Positive for activity change and appetite change.  HENT: Negative.   Eyes: Negative.   Respiratory: Negative.   Cardiovascular: Negative.   Gastrointestinal: Negative.   Endocrine: Negative.   Genitourinary: Negative.   Musculoskeletal: Negative.   Skin: Negative.   Allergic/Immunologic: Negative.   Neurological: Negative.   Hematological: Negative.   Psychiatric/Behavioral: Negative.   All other systems reviewed and are negative.     Allergies  Ace inhibitors and Lactose intolerance (gi)  Home Medications   Prior to Admission medications   Medication Sig Start Date End Date Taking? Authorizing Provider  aspirin 81 MG chewable tablet Chew 81 mg by mouth every morning.     Historical Provider, MD  atenolol (TENORMIN) 50 MG tablet TAKE 1 TABLET EVERY DAY (  NEED OFFICE VISIT FOR MORE REFILLS) Patient taking differently: Take 50 mg by mouth daily.  11/02/12   Dorena Cookey, MD  calcitonin, salmon, (MIACALCIN/FORTICAL) 200 UNIT/ACT nasal spray Place 1 spray into alternate nostrils daily.  07/21/15   Historical Provider, MD  carboxymethylcellulose (REFRESH TEARS) 0.5 % SOLN Apply 1 drop to eye daily as needed (dry eyes).     Historical Provider, MD  citalopram (CELEXA) 20 MG tablet TAKE  1  AND  1/2  TABLETS AT BEDTIME (NEED OFFICE VISIT FOR MORE REFILLS) 11/02/12 11/02/13  Dorena Cookey, MD  DULoxetine (CYMBALTA) 60 MG capsule Take 60 mg by mouth daily.   06/12/15   Historical Provider, MD  HYDROcodone-acetaminophen (NORCO/VICODIN) 5-325 MG tablet Take 1 tablet by mouth every 6 (six) hours as needed for severe pain. 07/24/15   Thurnell Lose, MD  losartan (COZAAR) 50 MG tablet Take 50 mg by mouth daily.  07/15/15   Historical Provider, MD  Probiotic Product (PROBIOTIC PO) Take 1 capsule by mouth daily.    Historical Provider, MD  traZODone (DESYREL) 50 MG tablet Take 1 tablet (50 mg total) by mouth 2 (two) times daily. Patient taking differently: Take 75 mg by mouth at bedtime.  12/13/12   Dorena Cookey, MD  VOLTAREN 1 % GEL Apply 4 g topically 4 (four) times daily.  06/12/15   Historical Provider, MD   BP 177/81 mmHg  Pulse 78  Temp(Src) 98.1 F (36.7 C) (Oral)  Resp 18  Ht 5\' 3"  (1.6 m)  Wt 65.772 kg  BMI 25.69 kg/m2  SpO2 96% Physical Exam  Constitutional: She appears well-developed and well-nourished. No distress.  HENT:  Head: Normocephalic and atraumatic.  Mucous membranes appear dry  Eyes: Pupils are equal, round, and reactive to light.  Cardiovascular: Normal rate and regular rhythm.   Pulmonary/Chest: Effort normal and breath sounds normal.  Abdominal: Soft.  Musculoskeletal: Normal range of motion.  Neurological: She is alert. She displays normal reflexes. No cranial nerve deficit. She exhibits normal muscle tone. Coordination normal.  Oriented to person place and date Unable to ambulate without assistance, very shaky with poor balance.   Skin: Skin is warm and dry.  Psychiatric: She has a normal mood and affect.  Nursing note and vitals reviewed.   ED Course  Procedures (including critical care time) Labs Review Labs Reviewed  CBC - Abnormal; Notable for the following:    WBC 13.0 (*)    All other components within normal limits  BASIC METABOLIC PANEL    Imaging Review Dg Chest 2 View  07/29/2015  CLINICAL DATA:  Altered mental status 1 day. EXAM: CHEST  2 VIEW COMPARISON:  06/04/2011 and abdominal pelvic CT  07/23/2015 FINDINGS: Lungs are adequately inflated without consolidation or effusion. Borderline cardiomegaly unchanged. There are degenerative changes of the spine. There is a moderate compression fracture of T12 new since 2013 and unchanged from the recent CT. Mild loss of height of 2 adjacent mid thoracic vertebral bodies these indeterminate but new since 2013. IMPRESSION: No acute cardiopulmonary disease. Moderate T12 compression fracture unchanged from the recent CT and new since 2013. Mild vertebral body height loss of 2 adjacent mid thoracic vertebral bodies age indeterminate. Electronically Signed   By: Marin Olp M.D.   On: 07/29/2015 16:15   Ct Head Wo Contrast  07/29/2015  CLINICAL DATA:  Confusion as well as diarrhea.  Hyponatremia. EXAM: CT HEAD WITHOUT CONTRAST TECHNIQUE: Contiguous axial images were obtained from the base of the  skull through the vertex without intravenous contrast. COMPARISON:  None. FINDINGS: The ventricles, cisterns and other CSF spaces are within normal. There is minimal chronic ischemic microvascular disease. There is no mass, mass effect, shift of midline structures or acute hemorrhage. No evidence of acute infarction. Remaining bones and soft tissues are within normal. IMPRESSION: No acute intracranial findings. Subtle chronic ischemic microvascular disease. Electronically Signed   By: Marin Olp M.D.   On: 07/29/2015 15:55   I have personally reviewed and evaluated these images and lab results as part of my medical decision-making.   EKG Interpretation   Date/Time:  Tuesday July 29 2015 14:47:23 EDT Ventricular Rate:  77 PR Interval:    QRS Duration: 85 QT Interval:  415 QTC Calculation: 470 R Axis:   -20 Text Interpretation:  Sinus rhythm Borderline left axis deviation  Confirmed by Davina Howlett MD, Andee Poles [id number]) on 07/29/2015 3:23:21 PM      MDM   Final diagnoses:  Weakness  Metabolic encephalopathy   82 y.o. Female with increasing confusion and  weakness.  No definitive source with mild to moderate hyponatremia (130), creatinine increased from .68 to 1.04.  Patient receiving iv fluids.  Suspect metabolic encephalopathy with combination narcotic use, volume depletion, hyponatremia.  Plan observation.  Discussed with Erin Hearing on call for hospitalist and they will see and decide bed status.      Pattricia Boss, MD 07/29/15 [phone]

## 2015-07-29 NOTE — H&P (Signed)
History and Physical    [name] [id number] DOB: August 03, 1921 DOA: 07/29/2015   PCP: Michelle Cipro, MD   Patient coming from/Resides with: Private residence/daughter  Chief Complaint: Confusion  HPI: [name] is a 82 y.o. female with medical history significant for hypertension, chronic diarrhea and lactose intolerance, chronic left knee pain, chronic back pain and chronic mild hyponatremia. Patient was recently hospitalized and discharged on 7/13 after an admission for spontaneous T12 compression fracture. She was continued on Vicodin and her calcitonin. She was sent out with home health PT. A TLSO brace had been recommended during the hospitalization but according to the patient's daughter patient never received brace prior to discharge. At time of discharge and after initial arrival to home patient wasn't ambulated with a rolling walker. Unfortunately since discharge patient has a progressive decline in function and primarily stays in the bed. She's had increasing confusion prompting her daughter to contact her primary care physician on 7/17. Labs drawn demonstrate a persistent hyponatremia despite discontinuation of hydrochlorothiazide during the previous admission. Because of the lab abnormalities and associated confusion the patient was instructed to come to the ER for evaluation.  ED Course:  Vital signs: 98.1-167/76-77-18-room air saturations 96% Portable chest x-ray: No acute cardiopulmonary disease. Moderate T12 compression fracture unchanged from recent CT. CT of the head without contrast: No acute intracranial findings Lab data: Sodium 1:30, potassium 3.6, BUN 13, creatinine 1.04, glucose 133, WBC 13,000 differential not obtained, hemoglobin 13.8, platelets 335,000; urinalysis concerning for UTI-culture pending Medications and treatments: Normal saline bolus 500 mL 1  Review of Systems:  In addition to the HPI above,  No Fever-chills, myalgias or other constitutional  symptoms No Headache, changes with Vision or hearing, new weakness, tingling, numbness in any extremity, No problems swallowing food or Liquids, indigestion/reflux the patient has reported since this morning lack of taste for food and no appetite No Chest pain, Cough or Shortness of Breath, palpitations, orthopnea or DOE No Abdominal pain, N/V; no melena or hematochezia, no dark tarry stools, Bowel movements are regular, No dysuria, hematuria or flank pain although she does report urine is more foul-smelling today No new skin rashes, lesions, masses or bruises, No new joints pains-aches No recent weight gain or loss No polyuria, polydypsia or polyphagia,   Past Medical History  Diagnosis Date  . Hypertension   . Diverticulitis   . Depression   . Anxiety   . Cancer [facility])     ENDOMETRIAL CANCER  . Arthritis     Hands/knees/hip/lower back - no meds    Past Surgical History  Procedure Laterality Date  . Colonscopy  2006  . Wisdom tooth extraction    . Svd      x 1  . Eye surgery      Bilateral cataract eye surgery  . Abdominal surgery      removed fibroid and one fallopian tube removed  . Appendectomy    . Dilation and curettage of uterus    . Hysteroscopy w/d&c  04/28/2011    Procedure: DILATATION AND CURETTAGE /HYSTEROSCOPY;  Surgeon: Gus Height, MD;  Location: [facility];  Service: Gynecology;  Laterality: N/A;  . Robotic assisted lap vaginal hysterectomy  06/08/2011    Procedure: ROBOTIC ASSISTED LAPAROSCOPIC VAGINAL HYSTERECTOMY;  Surgeon: Imagene Gurney A. Alycia Rossetti, MD;  Location: [facility];  Service: Gynecology;  Laterality: N/A;  . Lymph node dissection  06/08/2011    Procedure: LYMPH NODE DISSECTION;  Surgeon: Imagene Gurney A. Alycia Rossetti, MD;  Location: [facility];  Service: Gynecology;  Laterality: N/A;  Possible Lymph Nodes  . Abdominal hysterectomy      Social History   Social History  . Marital Status: Widowed    Spouse Name: N/A  . Number of Children: N/A  . Years of Education: N/A    Occupational History  . Not on file.   Social History Main Topics  . Smoking status: Never Smoker   . Smokeless tobacco: Never Used  . Alcohol Use: No  . Drug Use: No  . Sexual Activity: No   Other Topics Concern  . Not on file   Social History Narrative   She is from [place].  Moved to [facility] to be nearer to daughter [name]    Mobility: Before previous admission mobilized with a cane; since admission was mobilizing with a rolling walker but has progressively declined in function and now prefers not to ambulate secondary to generalized weakness Work history: Not obtained   Allergies  Allergen Reactions  . Ace Inhibitors Cough  . Lactose Intolerance (Gi)     Family History  Problem Relation Age of Onset  . Breast cancer Sister   . Lung cancer Brother      Prior to Admission medications   Medication Sig Start Date End Date Taking? Authorizing Provider  aspirin 81 MG chewable tablet Chew 81 mg by mouth every morning.    Yes Historical Provider, MD  atenolol (TENORMIN) 50 MG tablet TAKE 1 TABLET EVERY DAY (NEED OFFICE VISIT FOR MORE REFILLS) Patient taking differently: Take 50 mg by mouth daily.  11/02/12  Yes Dorena Cookey, MD  calcitonin, salmon, (MIACALCIN/FORTICAL) 200 UNIT/ACT nasal spray Place 1 spray into alternate nostrils daily.  07/21/15  Yes Historical Provider, MD  carboxymethylcellulose (REFRESH TEARS) 0.5 % SOLN Apply 1 drop to eye daily as needed (dry eyes).    Yes Historical Provider, MD  citalopram (CELEXA) 20 MG tablet TAKE  1  AND  1/2  TABLETS AT BEDTIME (NEED OFFICE VISIT FOR MORE REFILLS) 11/02/12 07/29/15 Yes Dorena Cookey, MD  DULoxetine (CYMBALTA) 60 MG capsule Take 60 mg by mouth daily.  06/12/15  Yes Historical Provider, MD  HYDROcodone-acetaminophen (NORCO/VICODIN) 5-325 MG tablet Take 1 tablet by mouth every 6 (six) hours as needed for severe pain. 07/24/15  Yes Thurnell Lose, MD  losartan (COZAAR) 50 MG tablet Take 100 mg by mouth  daily.  07/15/15  Yes Historical Provider, MD  Probiotic Product (PROBIOTIC PO) Take 1 capsule by mouth daily.   Yes Historical Provider, MD  traZODone (DESYREL) 50 MG tablet Take 1 tablet (50 mg total) by mouth 2 (two) times daily. Patient taking differently: Take 75 mg by mouth at bedtime.  12/13/12  Yes Dorena Cookey, MD  VOLTAREN 1 % GEL Apply 4 g topically 4 (four) times daily.  06/12/15  Yes Historical Provider, MD    Physical Exam: Filed Vitals:   07/29/15 1401 07/29/15 1500 07/29/15 1515 07/29/15 1605  BP: 177/81 167/76 166/73   Pulse: 78 77 74   Temp: 98.1 F (36.7 C)     TempSrc: Oral     Resp: 18 18 16    Height: 5\' 3"  (1.6 m)     Weight: 145 lb (65.772 kg)     SpO2: 96%   97%      Constitutional: NAD, Restless but not agitated or combative Eyes: PERRL, lids and conjunctivae normal ENMT: Mucous membranes are dry. Posterior pharynx clear of any exudate or lesions.  dentition appropriate to advanced age Neck: normal,  supple, no masses, no thyromegaly Respiratory: clear to auscultation bilaterally, no wheezing, no crackles. Normal respiratory effort. No accessory muscle use.  Cardiovascular: Regular rate and rhythm, no murmurs / rubs / gallops. No extremity edema. 2+ pedal pulses. No carotid bruits.  Abdomen: no tenderness, no masses palpated. No hepatosplenomegaly. Bowel sounds positive.  Musculoskeletal: no clubbing / cyanosis. No joint deformity upper and lower extremities. Good ROM, no contractures. Normal muscle tone. No pain in back with passive straight leg raise bilaterally Skin: no rashes, lesions, ulcers. No induration Neurologic: CN 2-12 grossly intact. Sensation intact, DTR normal. Strength 5/5 x all 4 extremities. Generalized tremulousness without any focal tonic-clonic activity Psychiatric: Pleasantly confused with short-term memory deficits noting patient verbalizes she is aware of these deficits, oriented to name and place  Labs on Admission: I have personally  reviewed following labs and imaging studies  CBC:  Recent Labs Lab 07/23/15 1516 07/23/15 2234 07/24/15 0541 07/29/15 1410  WBC 16.3* 14.2* 10.3 13.0*  NEUTROABS  --  9.9* 6.7  --   HGB 15.0 12.6 12.8 13.8  HCT 42.9 36.3 37.3 40.4  MCV 87.0 86.0 88.0 88.6  PLT 348 309 287 [id number]   Basic Metabolic Panel:  Recent Labs Lab 07/23/15 1516 07/23/15 1957 07/24/15 0541 07/29/15 1410  NA 122*  --  129* 130*  K 3.3*  --  3.1* 3.6  CL 85*  --  95* 92*  CO2 25  --  25 28  GLUCOSE 137*  --  105* 133*  BUN 16  --  13 13  CREATININE 0.77  --  0.68 1.04*  CALCIUM 9.6  --  8.9 9.9  MG  --  1.6* 1.7  --    GFR: Estimated Creatinine Clearance: 31.5 mL/min (by C-G formula based on Cr of 1.04). Liver Function Tests:  Recent Labs Lab 07/23/15 1516 07/24/15 0541  AST 22 16  ALT 15 11*  ALKPHOS 103 77  BILITOT 1.1 0.8  PROT 8.0 6.2*  ALBUMIN 4.7 3.6    Recent Labs Lab 07/23/15 1516  LIPASE 19   No results for input(s): AMMONIA in the last 168 hours. Coagulation Profile: No results for input(s): INR, PROTIME in the last 168 hours. Cardiac Enzymes: No results for input(s): CKTOTAL, CKMB, CKMBINDEX, TROPONINI in the last 168 hours. BNP (last 3 results) No results for input(s): PROBNP in the last 8760 hours. HbA1C: No results for input(s): HGBA1C in the last 72 hours. CBG: No results for input(s): GLUCAP in the last 168 hours. Lipid Profile: No results for input(s): CHOL, HDL, LDLCALC, TRIG, CHOLHDL, LDLDIRECT in the last 72 hours. Thyroid Function Tests: No results for input(s): TSH, T4TOTAL, FREET4, T3FREE, THYROIDAB in the last 72 hours. Anemia Panel: No results for input(s): VITAMINB12, FOLATE, FERRITIN, TIBC, IRON, RETICCTPCT in the last 72 hours. Urine analysis:    Component Value Date/Time   COLORURINE YELLOW 07/29/2015 1635   APPEARANCEUR TURBID* 07/29/2015 1635   LABSPEC 1.020 07/29/2015 1635   PHURINE 6.5 07/29/2015 1635   GLUCOSEU NEGATIVE 07/29/2015 1635    HGBUR SMALL* 07/29/2015 1635   BILIRUBINUR NEGATIVE 07/29/2015 1635   KETONESUR 15* 07/29/2015 1635   PROTEINUR NEGATIVE 07/29/2015 1635   NITRITE POSITIVE* 07/29/2015 1635   LEUKOCYTESUR LARGE* 07/29/2015 1635   Sepsis Labs: @LABRCNTIP (procalcitonin:4,lacticidven:4) ) Recent Results (from the past 240 hour(s))  Gastrointestinal Panel by PCR , Stool     Status: None   Collection Time: 07/24/15  5:05 AM  Result Value Ref Range Status   Campylobacter  species NOT DETECTED NOT DETECTED Final   Plesimonas shigelloides NOT DETECTED NOT DETECTED Final   Salmonella species NOT DETECTED NOT DETECTED Final   Yersinia enterocolitica NOT DETECTED NOT DETECTED Final   Vibrio species NOT DETECTED NOT DETECTED Final   Vibrio cholerae NOT DETECTED NOT DETECTED Final   Enteroaggregative E coli (EAEC) NOT DETECTED NOT DETECTED Final   Enteropathogenic E coli (EPEC) NOT DETECTED NOT DETECTED Final   Enterotoxigenic E coli (ETEC) NOT DETECTED NOT DETECTED Final   Shiga like toxin producing E coli (STEC) NOT DETECTED NOT DETECTED Final   E. coli O157 NOT DETECTED NOT DETECTED Final   Shigella/Enteroinvasive E coli (EIEC) NOT DETECTED NOT DETECTED Final   Cryptosporidium NOT DETECTED NOT DETECTED Final   Cyclospora cayetanensis NOT DETECTED NOT DETECTED Final   Entamoeba histolytica NOT DETECTED NOT DETECTED Final   Giardia lamblia NOT DETECTED NOT DETECTED Final   Adenovirus F40/41 NOT DETECTED NOT DETECTED Final   Astrovirus NOT DETECTED NOT DETECTED Final   Norovirus GI/GII NOT DETECTED NOT DETECTED Final   Rotavirus A NOT DETECTED NOT DETECTED Final   Sapovirus (I, II, IV, and V) NOT DETECTED NOT DETECTED Final     Radiological Exams on Admission: Dg Chest 2 View  07/29/2015  CLINICAL DATA:  Altered mental status 1 day. EXAM: CHEST  2 VIEW COMPARISON:  06/04/2011 and abdominal pelvic CT 07/23/2015 FINDINGS: Lungs are adequately inflated without consolidation or effusion. Borderline  cardiomegaly unchanged. There are degenerative changes of the spine. There is a moderate compression fracture of T12 new since 2013 and unchanged from the recent CT. Mild loss of height of 2 adjacent mid thoracic vertebral bodies these indeterminate but new since 2013. IMPRESSION: No acute cardiopulmonary disease. Moderate T12 compression fracture unchanged from the recent CT and new since 2013. Mild vertebral body height loss of 2 adjacent mid thoracic vertebral bodies age indeterminate. Electronically Signed   By: Marin Olp M.D.   On: 07/29/2015 16:15   Ct Head Wo Contrast  07/29/2015  CLINICAL DATA:  Confusion as well as diarrhea.  Hyponatremia. EXAM: CT HEAD WITHOUT CONTRAST TECHNIQUE: Contiguous axial images were obtained from the base of the skull through the vertex without intravenous contrast. COMPARISON:  None. FINDINGS: The ventricles, cisterns and other CSF spaces are within normal. There is minimal chronic ischemic microvascular disease. There is no mass, mass effect, shift of midline structures or acute hemorrhage. No evidence of acute infarction. Remaining bones and soft tissues are within normal. IMPRESSION: No acute intracranial findings. Subtle chronic ischemic microvascular disease. Electronically Signed   By: Marin Olp M.D.   On: 07/29/2015 15:55    EKG: (Independently reviewed) Sinus rhythm with ventricular rate 77 bpm, QTC 470 ms, normal EKG without any acute ischemic changes  Assessment/Plan Principal Problem:   Acute encephalopathy -Suspect multifactorial etiology secondary to acute UTI as well as possibly from side effects from Vicodin and less likely chronic hyponatremia which is actually improved from previous -Treat underlying causes -Provided for patient safety -Daughter expresses concerns over ability to care for patient at home so we have requested SW to evaluate for skilled nursing facility placement  Active Problems:   Chronic hyponatremia -Sodium at  discharge was 129 with a nadir during that previous admission of 122 -Today sodium is 130 during this is an improvement since previous hydrochlorothiazide discontinued -Do not suspect hyponatremia primary cause for patient's encephalopathy although could be contributing to her tremulous symptoms -Patient actually appears dehydrated so will continue normal saline  at 75 mL per hour    Acute UTI -Urinalysis highly abnormal and given leukocytosis and altered mentation suspect patient does have a urinary tract infection -Empiric Rocephin -Follow up on urine culture    Essential hypertension -Current blood pressure moderately controlled on home Cozaar and Tenormin so these will be continued during this admission    Compression fracture of thoracic vertebra  -Patient endorses continued pain but did well with passive straight leg raise in bed -Continue calcitonin nasal spray -Because of acute encephalopathy we are holding Vicodin and other narcotics for now -Continue Tylenol -Scheduled Toradol 15 mg IV every 12 hours with twice a day IV Pepcid following renal function closely -Lidoderm patch to T12/L1 region -PT/OT evaluation -Apply TLSO brace    Depression -Patient no longer takes Celexa -Continue Cymbalta as well as trazodone at bedtime    Chronic diarrhea -Patient has undergone multiple workups with out any etiology to diarrhea other than known lactose intolerance -Continue lactose-free diet      DVT prophylaxis: Lovenox Code Status: DO NOT RESUSCITATE Family Communication: Not are at bedside Dyke Maes Disposition Plan: Pending PT/OT evaluation and social work consultation anticipate patient will likely discharge to skilled nursing facility Consults called: None Admission status: Inpatient/MedSurg    Dae Highley L. ANP-BC [facility] Pager [phone]   If 7PM-7AM, please contact night-coverage [email or web address] Password [facility]  07/29/2015, 5:32 PM

## 2015-07-29 NOTE — Progress Notes (Signed)
New Admission Note: transfer from ED  Arrival Method: stretcher Mental Orientation: a/o, confused at times Telemetry:none Assessment: Completed Skin:clean dry intact IV:RAC Pain:none Tubes:none Safety Measures: Safety Fall Prevention Plan has been given, discussed and signed Admission: Completed Unit Orientation: Patient has been orientated to the room, unit and staff.  Family:none present  Orders have been reviewed and implemented. Will continue to monitor the patient. Call light has been placed within reach and bed alarm has been activated.   Retta Mac BSN, RN

## 2015-07-29 NOTE — ED Notes (Signed)
Pt sent here for hyponatremia per PCP as reported by [facility].  Pt denies any complaints.

## 2015-07-29 NOTE — ED Notes (Addendum)
Pt assisted to bedside commode by this RN. Pt extremely shaky when transferring and required complete assist to stand and transfer back to bed. MD notified

## 2015-07-29 NOTE — ED Notes (Signed)
Patient is stable for transport at this time and will be transported by Charm Rings to [facility] bed 20.  All belongings are taken by the patient/family.

## 2015-07-30 DIAGNOSIS — B370 Candidal stomatitis: Secondary | ICD-10-CM

## 2015-07-30 DIAGNOSIS — G9340 Encephalopathy, unspecified: Secondary | ICD-10-CM

## 2015-07-30 DIAGNOSIS — R159 Full incontinence of feces: Secondary | ICD-10-CM

## 2015-07-30 DIAGNOSIS — N390 Urinary tract infection, site not specified: Secondary | ICD-10-CM

## 2015-07-30 DIAGNOSIS — Z789 Other specified health status: Secondary | ICD-10-CM

## 2015-07-30 DIAGNOSIS — B3781 Candidal esophagitis: Secondary | ICD-10-CM

## 2015-07-30 DIAGNOSIS — K529 Noninfective gastroenteritis and colitis, unspecified: Secondary | ICD-10-CM

## 2015-07-30 LAB — BASIC METABOLIC PANEL
Anion gap: 9 (ref 5–15)
BUN: 9 mg/dL (ref 6–20)
CALCIUM: 9.1 mg/dL (ref 8.9–10.3)
CO2: 26 mmol/L (ref 22–32)
Chloride: 95 mmol/L — ABNORMAL LOW (ref 101–111)
Creatinine, Ser: 0.74 mg/dL (ref 0.44–1.00)
GLUCOSE: 119 mg/dL — AB (ref 65–99)
Potassium: 2.9 mmol/L — ABNORMAL LOW (ref 3.5–5.1)
Sodium: 130 mmol/L — ABNORMAL LOW (ref 135–145)

## 2015-07-30 LAB — CBC
HCT: 37.9 % (ref 36.0–46.0)
HEMOGLOBIN: 12.5 g/dL (ref 12.0–15.0)
MCH: 29.3 pg (ref 26.0–34.0)
MCHC: 33 g/dL (ref 30.0–36.0)
MCV: 89 fL (ref 78.0–100.0)
Platelets: 263 10*3/uL (ref 150–400)
RBC: 4.26 MIL/uL (ref 3.87–5.11)
RDW: 13.8 % (ref 11.5–15.5)
WBC: 10.7 10*3/uL — AB (ref 4.0–10.5)

## 2015-07-30 MED ORDER — NYSTATIN 100000 UNIT/ML MT SUSP
5.0000 mL | Freq: Four times a day (QID) | OROMUCOSAL | Status: DC
  Administered 2015-07-30 – 2015-08-01 (×7): 500000 [IU] via ORAL
  Filled 2015-07-30 (×7): qty 5

## 2015-07-30 MED ORDER — POTASSIUM CHLORIDE CRYS ER 20 MEQ PO TBCR
40.0000 meq | EXTENDED_RELEASE_TABLET | Freq: Three times a day (TID) | ORAL | Status: DC
  Administered 2015-07-30 – 2015-08-01 (×6): 40 meq via ORAL
  Filled 2015-07-30 (×8): qty 2

## 2015-07-30 MED ORDER — HYDRALAZINE HCL 20 MG/ML IJ SOLN
10.0000 mg | INTRAMUSCULAR | Status: DC | PRN
  Administered 2015-07-30 – 2015-08-01 (×5): 10 mg via INTRAVENOUS
  Filled 2015-07-30 (×5): qty 1

## 2015-07-30 MED ORDER — ATENOLOL 50 MG PO TABS
50.0000 mg | ORAL_TABLET | Freq: Two times a day (BID) | ORAL | Status: DC
  Administered 2015-07-30 – 2015-08-01 (×4): 50 mg via ORAL
  Filled 2015-07-30 (×4): qty 1

## 2015-07-30 MED ORDER — FAMOTIDINE IN NACL 20-0.9 MG/50ML-% IV SOLN
20.0000 mg | INTRAVENOUS | Status: DC
  Administered 2015-07-31: 20 mg via INTRAVENOUS
  Filled 2015-07-30: qty 50

## 2015-07-30 NOTE — Progress Notes (Signed)
Orthopedic Tech Progress Note Patient Details:  [name] [birth year] [id number]  Patient ID: [name], female   DOB: [birth year], 82 y.o.   MRN: [id number] Called in bio-tech brace order; spoke with Dolores Lory, Evany Schecter 07/30/2015, 9:25 AM

## 2015-07-30 NOTE — Care Management Important Message (Signed)
Important Message  Patient Details  Name: [name] MRN: [id number] Date of Birth: July 12, 1921   Medicare Important Message Given:  Yes    Loann Quill 07/30/2015, 10:11 AM

## 2015-07-30 NOTE — Evaluation (Signed)
Clinical/Bedside Swallow Evaluation Patient Details  Name: [name] MRN: [id number] Date of Birth: 07-Jan-1922  Today's Date: 07/30/2015 Time: SLP Start Time (ACUTE ONLY): 26 SLP Stop Time (ACUTE ONLY): 1602 SLP Time Calculation (min) (ACUTE ONLY): 20 min  Past Medical History:  Past Medical History  Diagnosis Date  . Hypertension   . Diverticulitis   . Depression   . Anxiety   . Cancer [facility])     ENDOMETRIAL CANCER  . Arthritis     Hands/knees/hip/lower back - no meds   Past Surgical History:  Past Surgical History  Procedure Laterality Date  . Colonscopy  2006  . Wisdom tooth extraction    . Svd      x 1  . Eye surgery      Bilateral cataract eye surgery  . Abdominal surgery      removed fibroid and one fallopian tube removed  . Appendectomy    . Dilation and curettage of uterus    . Hysteroscopy w/d&c  04/28/2011    Procedure: DILATATION AND CURETTAGE /HYSTEROSCOPY;  Surgeon: Gus Height, MD;  Location: [facility];  Service: Gynecology;  Laterality: N/A;  . Robotic assisted lap vaginal hysterectomy  06/08/2011    Procedure: ROBOTIC ASSISTED LAPAROSCOPIC VAGINAL HYSTERECTOMY;  Surgeon: Imagene Gurney A. Alycia Rossetti, MD;  Location: [facility];  Service: Gynecology;  Laterality: N/A;  . Lymph node dissection  06/08/2011    Procedure: LYMPH NODE DISSECTION;  Surgeon: Imagene Gurney A. Alycia Rossetti, MD;  Location: [facility];  Service: Gynecology;  Laterality: N/A;  Possible Lymph Nodes  . Abdominal hysterectomy     HPI:  82 y.o. female with medical history significant for hypertension, chronic diarrhea, chronic back pain and chronic mild hyponatremia, spontaneous T12 compression fracture. Acute encephalopathy multifactoral per MD note secondary to acute UTI, hypertensive urgency. CXR No acute cardiopulmonary disease. Difficulty swallowing pills this morning.   Assessment / Plan / Recommendation Clinical Impression  Pt alert, following commands. Audible swallow x 1 can be indicative of discoordination or  anatomical difference. Pt's daughter denied coughing with po's but exhibited difficulty swallowing pills this morning. No coughing or indication of pharyngeal retention. Palliative care meeting with pt and daughter in room. Daughter and pt in agreement with recommendations to continue regular texture and thin liquids, crush pills, straws allowed, sit upright     Aspiration Risk  Moderate aspiration risk    Diet Recommendation Regular;Thin liquid   Liquid Administration via: Straw;Cup Medication Administration: Crushed with puree Supervision: Patient able to self feed;Intermittent supervision to cue for compensatory strategies Compensations: Slow rate;Small sips/bites Postural Changes: Seated upright at 90 degrees    Other  Recommendations Oral Care Recommendations: Oral care BID   Follow up Recommendations  None    Frequency and Duration            Prognosis        Swallow Study   General HPI: 82 y.o. female with medical history significant for hypertension, chronic diarrhea, chronic back pain and chronic mild hyponatremia, spontaneous T12 compression fracture. Acute encephalopathy multifactoral per MD note secondary to acute UTI, hypertensive urgency. CXR No acute cardiopulmonary disease. Difficulty swallowing pills this morning. Type of Study: Bedside Swallow Evaluation Previous Swallow Assessment:  (none) Diet Prior to this Study: Regular;Thin liquids Temperature Spikes Noted: No Respiratory Status: Room air History of Recent Intubation: No Behavior/Cognition: Alert;Cooperative;Pleasant mood;Requires cueing Oral Cavity Assessment: Within Functional Limits Oral Care Completed by SLP: No Oral Cavity - Dentition: Dentures, top;Dentures, bottom Vision: Functional for self-feeding Self-Feeding Abilities: Able  to feed self;Needs set up;Needs assist Patient Positioning: Upright in bed Baseline Vocal Quality: Normal Volitional Cough: Strong Volitional Swallow: Able to elicit     Oral/Motor/Sensory Function Overall Oral Motor/Sensory Function: Within functional limits   Ice Chips Ice chips: Not tested   Thin Liquid Thin Liquid: Impaired Presentation: Straw Pharyngeal  Phase Impairments: Suspected delayed Swallow (audible swallow)    Nectar Thick Nectar Thick Liquid: Not tested   Honey Thick Honey Thick Liquid: Not tested   Puree Puree: Within functional limits   Solid   GO   Solid: Within functional limits        Houston Siren 07/30/2015,4:14 PM  Orbie Pyo Colvin Caroli.Ed [employer] [phone]

## 2015-07-30 NOTE — Progress Notes (Signed)
Patients daughter at nurses station inquiring about when patient would be seen by the speech therapist. Stated that she was concerned that patient had not eaten today and could not take her PO meds. Called the Rehab department (in the presence of patients daughter) to inquire about how much longer before ST would see patient. Was informed that the request would be routed to a ST and would call RN back to inform of how long it would be.  Patients daughter informed.  Ruthellen Tippy, Bryn Gulling

## 2015-07-30 NOTE — Progress Notes (Signed)
PT Cancellation Note  Patient Details Name: [name] MRN: [id number] DOB: 04-11-1921   Cancelled Treatment:    Reason Eval/Treat Not Completed: Medical issues which prohibited therapy (awaiting spinal brace and elevated BP-ck later) as time and pt allow.   Ramond Dial 07/30/2015, 10:50 AM    Mee Hives, PT MS Acute Rehab Dept. Number: [place] [facility] [place]

## 2015-07-30 NOTE — Consult Note (Signed)
Consultation Note Date: 07/31/2015   Patient Name: [name]  DOB: 1921/10/31  MRN: [id number]  Age / Sex: 82 y.o., female  PCP: Michelle Bien, MD Referring Physician: Reyne Dumas, MD  Reason for Consultation: Establishing goals of care  HPI/Patient Profile: 82 y.o. female  with past medical history of HTN, Arthritis, endometrial ca, chronic diarrhea and back pain admitted on 07/29/2015 withmild hyponatremia and confusion likely secondary to UTI. This is her second hospitalization in the past six months. She was previously hospitalized in early July with back pain and was found to have a T12 compression fracture, she is not a candidate for kyphoplasty. She has otherwise prior to those admissions been healthy, independent with ADL's, and was living with her daughter, [name], who is HCPOA. Consult was requested by daughter [name] to discuss Advance Care planning and goals of care.  Clinical Assessment and Goals of Care: Pleasantly confused 82 yo elderly lady with recent significant decline in function. I met with the patient and her daughter in the patient's hospital room. She has had the confusion for the past several days. She is pleasantly confused, oriented to person and place, not to time. However, she does know who the president is. Currently being treated with IV antibiotics for UTI. She is eating meals and able to ambulate, but does have some difficulty ambulating due to pain from the compression fracture. Her pain in her back is controlled when she is at rest, but is severe when she moves. She is lactose intolerant and has had chronic diarrhea. [name] states for several months she feels the urgent need to move bowels, but has difficulty making it to the bathroom in time. This causes distress to the patient and her daughter.  Discussed with daughter the services provided by Palliative Medicine including  help with symptom management, improving quality of life, and advanced healthcare planning. [name] notes that she does not think she can care for patient at home in her current state. She is interested in nursing home placement and outpatient palliative care. She would eventually like to request Hospice involvement, but is not ready yet. Her mother is very resistant to Hospice as well, but open the Palliative Care.  -  HCPOA- Daughter-     SUMMARY OF RECOMMENDATIONS   DC to SNF with outpatient Palliative to follow and transition to Hospice when appropriate.  Code Status/Advance Care Planning:  DNR   MOST form completed, copy to chart, original given to daughter  Durable DNR form completed and placed on chart    Symptom Management:   Thrush- started on Nystatin swish and swallow  Pain- recommend low dose dexamethasone (52m BID) after infection is resolved and Toradol is stopped  Fecal incontinence- recommend OT for assistance for possible bowel regimen training   Palliative Prophylaxis:   Delirium Protocol  Additional Recommendations (Limitations, Scope, Preferences):  Avoid Hospitalization, No Artificial Feeding, No Chemotherapy, No Hemodialysis, No Surgical Procedures and No Tracheostomy  Psycho-social/Spiritual:   Desire for further Chaplaincy support:No  Additional Recommendations: Education on  Hospice  Prognosis:   < 12 months- this is her first incident of a serious illness, but given the decline in her mental status and the fact of her needing skilled nursing, I suspect her trajectory will significantly decline and she will be Hospice eligible within a few months  Discharge Planning: [name] with outpatient [place]- my RN, [name] has contacted [facility] to help arrange outpatient Palliative Care Services for symptom management (pain, fecal incontinence, confusion)      Primary Diagnoses: Present on Admission:    . Compression fracture of thoracic vertebra (HCC) . Depression . Essential hypertension . Chronic hyponatremia . Acute encephalopathy . Chronic diarrhea . Acute UTI  I have reviewed the medical record, interviewed the patient and family, and examined the patient. The following aspects are pertinent.  Past Medical History  Diagnosis Date  . Hypertension   . Diverticulitis   . Depression   . Anxiety   . Cancer [facility])     ENDOMETRIAL CANCER  . Arthritis     Hands/knees/hip/lower back - no meds   Social History   Social History  . Marital Status: Widowed    Spouse Name: N/A  . Number of Children: N/A  . Years of Education: N/A   Social History Main Topics  . Smoking status: Never Smoker   . Smokeless tobacco: Never Used  . Alcohol Use: No  . Drug Use: No  . Sexual Activity: No   Other Topics Concern  . None   Social History Narrative   She is from [place].  Moved to [place] to be nearer to daughter [name]   Family History  Problem Relation Age of Onset  . Breast cancer Sister   . Lung cancer Brother    Scheduled Meds: . atenolol  50 mg Oral BID  . calcitonin (salmon)  1 spray Alternating Nares Daily  . cefTRIAXone (ROCEPHIN)  IV  1 g Intravenous Q24H  . cyclobenzaprine  5 mg Oral TID  . DULoxetine  60 mg Oral Daily  . enoxaparin (LOVENOX) injection  40 mg Subcutaneous Q24H  . [START ON 08/01/2015] famotidine  20 mg Oral Daily  . ketorolac  15 mg Intravenous Q12H  . lidocaine  1 patch Transdermal Q24H  . losartan  100 mg Oral Daily  . nystatin  5 mL Oral QID  . potassium chloride  40 mEq Oral TID  . traMADol  50 mg Oral Q12H  . traZODone  75 mg Oral QHS   Continuous Infusions: . sodium chloride 75 mL/hr at 07/31/15 0107   PRN Meds:.acetaminophen **OR** acetaminophen, hydrALAZINE, polyvinyl alcohol, traMADol Medications Prior to Admission:  Prior to Admission medications   Medication Sig Start Date End Date Taking? Authorizing Provider  aspirin  81 MG chewable tablet Chew 81 mg by mouth every morning.    Yes Historical Provider, MD  atenolol (TENORMIN) 50 MG tablet TAKE 1 TABLET EVERY DAY (NEED OFFICE VISIT FOR MORE REFILLS) Patient taking differently: Take 50 mg by mouth daily.  11/02/12  Yes Dorena Cookey, MD  calcitonin, salmon, (MIACALCIN/FORTICAL) 200 UNIT/ACT nasal spray Place 1 spray into alternate nostrils daily.  07/21/15  Yes Historical Provider, MD  carboxymethylcellulose (REFRESH TEARS) 0.5 % SOLN Apply 1 drop to eye daily as needed (dry eyes).    Yes Historical Provider, MD  DULoxetine (CYMBALTA) 60 MG capsule Take 60 mg by mouth daily.  06/12/15  Yes Historical Provider, MD  HYDROcodone-acetaminophen (NORCO/VICODIN) 5-325 MG tablet Take 1 tablet  by mouth every 6 (six) hours as needed for severe pain. 07/24/15  Yes Thurnell Lose, MD  losartan (COZAAR) 50 MG tablet Take 100 mg by mouth daily.  07/15/15  Yes Historical Provider, MD  Probiotic Product (PROBIOTIC PO) Take 1 capsule by mouth daily.   Yes Historical Provider, MD  traZODone (DESYREL) 50 MG tablet Take 1 tablet (50 mg total) by mouth 2 (two) times daily. Patient taking differently: Take 75 mg by mouth at bedtime.  12/13/12  Yes Dorena Cookey, MD  VOLTAREN 1 % GEL Apply 4 g topically 4 (four) times daily.  06/12/15  Yes Historical Provider, MD   Allergies  Allergen Reactions  . Ace Inhibitors Cough  . Lactose Intolerance (Gi)    Review of Systems  Unable to perform ROS: Mental status change    Physical Exam  Constitutional:  Frail, elderly female in bed  HENT:  Head: Normocephalic and atraumatic.  Eyes: EOM are normal. Pupils are equal, round, and reactive to light. Right eye exhibits no discharge. Left eye exhibits no discharge. No scleral icterus.  Neck: Normal range of motion.  Cardiovascular: Normal rate, regular rhythm and normal heart sounds.   Pulmonary/Chest: Effort normal and breath sounds normal. No respiratory distress.  Abdominal: Soft. Bowel  sounds are normal. She exhibits no distension.  Neurological: She is alert.  Oriented to person and place  Psychiatric:  Pleasantly confused, paranoid at times    Vital Signs: BP 178/84 mmHg  Pulse 96  Temp(Src) 98 F (36.7 C) (Oral)  Resp 19  Ht 5' 3"  (1.6 m)  Wt 63.504 kg (140 lb)  BMI 24.81 kg/m2  SpO2 99% Pain Assessment: No/denies pain   Pain Score: 0-No pain   SpO2: SpO2: 99 % O2 Device:SpO2: 99 % O2 Flow Rate: .   IO: Intake/output summary:   Intake/Output Summary (Last 24 hours) at 07/31/15 1113 Last data filed at 07/31/15 1035  Gross per 24 hour  Intake   2300 ml  Output      0 ml  Net   2300 ml    LBM: Last BM Date: 07/30/15 Baseline Weight: Weight: 65.772 kg (145 lb) Most recent weight: Weight: 63.504 kg (140 lb)     Palliative Assessment/Data: 60%   Flowsheet Rows        Most Recent Value   Intake Tab    Referral Department  Hospitalist   Unit at Time of Referral  Med/Surg Unit   Palliative Care Primary Diagnosis  Sepsis/Infectious Disease   Date Notified  07/30/15   Palliative Care Type  New Palliative care   Reason for referral  Clarify Goals of Care   Date of Admission  07/29/15   Date first seen by Palliative Care  07/30/15   # of days IP prior to Palliative referral  1   Clinical Assessment    Palliative Performance Scale Score  60%   Psychosocial & Spiritual Assessment    Palliative Care Outcomes      Thank you for this consult with this pleasant patient and daughter. Please contact me if any further needs arise.   Time In: 1530 Time Out: 1645 Time Total: 75 minutes Greater than 50%  of this time was spent counseling and coordinating care related to the above assessment and plan.  Signed by: Mariana Kaufman, AGNP-C [phone]- cell pgr  [phone]  Please contact Palliative Medicine Team phone at [phone] for questions and concerns.  For individual provider: See Shea Evans

## 2015-07-30 NOTE — Progress Notes (Signed)
PT came to me and mentioned Patients BP 203/126 and pulse 187. MD notified and I requested IV BP meds for Patient. I went into patients room to check on patient. She appeared asymptomatic and she stated she "felt fine." the Patient's daughter then commented asking why this situation was not addressed earlier and that she is taking detailed notes and that she might have to review them with her lawyer. I assured the daughter that I have notified the MD about her situation and and that I ordered a Swallow eval on the patient and that we are doing everything we can right now to take care of her. Atthat time her IV also infiltrated so I put an IV team order in. We rechecked the patients vital signs and her BP 170 96, HR 96. I notified the MD again and she ordered BP medicine, an EKG, and to place the patient on cardiac monitoring. Will continue to monitor patent

## 2015-07-30 NOTE — Progress Notes (Addendum)
[facility] PROGRESS NOTE  Shakerah Cage [id number] DOB: 06-04-1921 DOA: 07/29/2015   PCP: Rachell Cipro, MD     Assessment/Plan: Principal Problem:   Acute encephalopathy Active Problems:   Essential hypertension   Chronic hyponatremia   Acute UTI   Depression   Compression fracture of thoracic vertebra (HCC)   Chronic diarrhea   82 y.o. female with a Past Medical History of hypertension, diverticulosis, endometrial cancer, depression who presents with very mild hyponatremia and confusion likely secondary to UTI.Lab data: Sodium 130, potassium 3.6, BUN 13, creatinine 1.04, glucose 133, WBC 13,000 differential not obtained, hemoglobin 13.8, platelets 335,000; urinalysis concerning for UTI-culture pending  Assessment and plan Acute encephalopathy -Suspect multifactorial etiology secondary to acute UTI /hyponatremia/pain medications -Treat underlying causes -Daughter expresses concerns over ability to care for patient at home so we have requested SW to evaluate for skilled nursing facility placement She also requested a palliative care consultation for goals of care Patient has long-term care insurance and would like to be placed in a nursing home   Hypertensive urgency Started on hydralazine as needed Patient is also on atenolol, dose increased to 50 mg twice a day   Chronic hyponatremia -Sodium at discharge was 129 with a nadir during that previous admission of 122 -Today sodium is 130 during this is an improvement since previous hydrochlorothiazide discontinued -Patient actually appears dehydrated so will continue normal saline at 75 mL per hour   Acute UTI -Urinalysis highly abnormal and given leukocytosis and altered mentation suspect patient does have a urinary tract infection -Empiric Rocephin -Follow up on urine culture   Essential hypertension -Current blood pressure moderately controlled on home Cozaar and Tenormin so these will be continued  during this admission    Compression fracture of thoracic vertebra  -Patient endorses continued pain but did well with passive straight leg raise in bed -Continue calcitonin nasal spray -Because of acute encephalopathy we are holding Vicodin and other narcotics for now -Continue Tylenol -Scheduled Toradol 15 mg IV every 12 hours with twice a day IV Pepcid following renal function closely -Lidoderm patch to T12/L1 region -PT/OT evaluation pending -Apply TLSO brace   Depression -Patient no longer takes Celexa -Continue Cymbalta as well as trazodone at bedtime   Chronic diarrhea -Patient has undergone multiple workups with out any etiology to diarrhea other than known lactose intolerance -Continue lactose-free diet     DVT prophylaxsis Lovenox  Code Status:  DO NOT RESUSCITATE    Family Communication: Discussed in detail with the patient's daughter, all imaging results, lab results explained to the patient   Disposition Plan:  Daughter requesting palliative care consult for goals of care, SNF    Consultants:  Palliative care  Procedures:  None   Antibiotics: Anti-infectives    Start     Dose/Rate Route Frequency Ordered Stop   07/29/15 1745  cefTRIAXone (ROCEPHIN) 1 g in dextrose 5 % 50 mL IVPB     1 g 100 mL/hr over 30 Minutes Intravenous Every 24 hours 07/29/15 1732           HPI/Subjective: Patient seems a little confused, patient's daughter is by the bedside and reports worsening confusion over the last couple of days  Objective: Filed Vitals:   07/29/15 1815 07/29/15 1851 07/29/15 2306 07/30/15 0708  BP: 185/76 188/110 155/71 143/80  Pulse: 78 82 89 118  Temp:  98.9 F (37.2 C) 98.2 F (36.8 C)   TempSrc:  Oral Oral   Resp: 16  17 18   Height:      Weight:      SpO2: 97% 99% 95% 99%    Intake/Output Summary (Last 24 hours) at 07/30/15 0802 Last data filed at 07/30/15 0600  Gross per 24 hour  Intake 1118.75 ml  Output      0 ml  Net  1118.75 ml    Exam:  Examination:  General exam: Appears calm and comfortable  Respiratory system: Clear to auscultation. Respiratory effort normal. Cardiovascular system: S1 & S2 heard, RRR. No JVD, murmurs, rubs, gallops or clicks. No pedal edema. Gastrointestinal system: Abdomen is nondistended, soft and nontender. No organomegaly or masses felt. Normal bowel sounds heard. Central nervous system: Alert and oriented. No focal neurological deficits. Extremities: Symmetric 5 x 5 power. Skin: No rashes, lesions or ulcers Psychiatry: Judgement and insight Impaired. Mood & affect appropriate.     Data Reviewed: I have personally reviewed following labs and imaging studies  Micro Results Recent Results (from the past 240 hour(s))  Gastrointestinal Panel by PCR , Stool     Status: None   Collection Time: 07/24/15  5:05 AM  Result Value Ref Range Status   Campylobacter species NOT DETECTED NOT DETECTED Final   Plesimonas shigelloides NOT DETECTED NOT DETECTED Final   Salmonella species NOT DETECTED NOT DETECTED Final   Yersinia enterocolitica NOT DETECTED NOT DETECTED Final   Vibrio species NOT DETECTED NOT DETECTED Final   Vibrio cholerae NOT DETECTED NOT DETECTED Final   Enteroaggregative E coli (EAEC) NOT DETECTED NOT DETECTED Final   Enteropathogenic E coli (EPEC) NOT DETECTED NOT DETECTED Final   Enterotoxigenic E coli (ETEC) NOT DETECTED NOT DETECTED Final   Shiga like toxin producing E coli (STEC) NOT DETECTED NOT DETECTED Final   E. coli O157 NOT DETECTED NOT DETECTED Final   Shigella/Enteroinvasive E coli (EIEC) NOT DETECTED NOT DETECTED Final   Cryptosporidium NOT DETECTED NOT DETECTED Final   Cyclospora cayetanensis NOT DETECTED NOT DETECTED Final   Entamoeba histolytica NOT DETECTED NOT DETECTED Final   Giardia lamblia NOT DETECTED NOT DETECTED Final   Adenovirus F40/41 NOT DETECTED NOT DETECTED Final   Astrovirus NOT DETECTED NOT DETECTED Final   Norovirus GI/GII NOT  DETECTED NOT DETECTED Final   Rotavirus A NOT DETECTED NOT DETECTED Final   Sapovirus (I, II, IV, and V) NOT DETECTED NOT DETECTED Final    Radiology Reports Dg Chest 2 View  07/29/2015  CLINICAL DATA:  Altered mental status 1 day. EXAM: CHEST  2 VIEW COMPARISON:  06/04/2011 and abdominal pelvic CT 07/23/2015 FINDINGS: Lungs are adequately inflated without consolidation or effusion. Borderline cardiomegaly unchanged. There are degenerative changes of the spine. There is a moderate compression fracture of T12 new since 2013 and unchanged from the recent CT. Mild loss of height of 2 adjacent mid thoracic vertebral bodies these indeterminate but new since 2013. IMPRESSION: No acute cardiopulmonary disease. Moderate T12 compression fracture unchanged from the recent CT and new since 2013. Mild vertebral body height loss of 2 adjacent mid thoracic vertebral bodies age indeterminate. Electronically Signed   By: Marin Olp M.D.   On: 07/29/2015 16:15   Dg Lumbar Spine Complete  07/21/2015  CLINICAL DATA:  Low back pain for the past 5 days radiating into the left hip without known injury EXAM: LUMBAR SPINE - COMPLETE 4+ VIEW COMPARISON:  None in PACs FINDINGS: There is moderate dextrocurvature of the lower thoracic and lumbar spine centered at L1-2. The pedicles and transverse processes of the  lumbar spine are intact. There is no compression fracture or spondylolisthesis. There is moderate to severe degenerative narrowing of the L3-4 and L4-5 disc spaces with mild to moderate narrowing at L1-2 and L2-3. The L5-S1 disc space is normal. There is facet joint hypertrophy from L3-S1. IMPRESSION: Multilevel moderate to severe degenerative disc and facet joint disease as described. No compression fracture. Mild levocurvature of the thoracolumbar spine. Move Electronically Signed   By: David  Martinique M.D.   On: 07/21/2015 14:46   Dg Pelvis 1-2 Views  07/21/2015  CLINICAL DATA:  Five days of low back pain radiating  into the left hip without known injury EXAM: PELVIS - 1-2 VIEW COMPARISON:  None in PACs FINDINGS: The bony pelvis is subjectively osteopenic. There is no lytic or blastic lesion or acute fracture. There is mild narrowing of the hip joint spaces bilaterally slightly greater on the right than on the left. The femoral necks, intertrochanteric, and subtrochanteric regions are grossly normal. The bowel gas pattern is unremarkable. IMPRESSION: There is mild to moderate osteoarthritic change of both hips. There is no acute bony abnormality of the pelvis. Electronically Signed   By: David  Martinique M.D.   On: 07/21/2015 14:47   Ct Head Wo Contrast  07/29/2015  CLINICAL DATA:  Confusion as well as diarrhea.  Hyponatremia. EXAM: CT HEAD WITHOUT CONTRAST TECHNIQUE: Contiguous axial images were obtained from the base of the skull through the vertex without intravenous contrast. COMPARISON:  None. FINDINGS: The ventricles, cisterns and other CSF spaces are within normal. There is minimal chronic ischemic microvascular disease. There is no mass, mass effect, shift of midline structures or acute hemorrhage. No evidence of acute infarction. Remaining bones and soft tissues are within normal. IMPRESSION: No acute intracranial findings. Subtle chronic ischemic microvascular disease. Electronically Signed   By: Marin Olp M.D.   On: 07/29/2015 15:55   Ct Lumbar Spine Wo Contrast  07/23/2015  CLINICAL DATA:  Abdominal pain. Low back pain with nausea and vomiting for 2 days. EXAM: CT LUMBAR SPINE WITHOUT CONTRAST TECHNIQUE: Multidetector CT imaging of the lumbar spine was performed without intravenous contrast administration. Multiplanar CT image reconstructions were also generated. COMPARISON:  Lumbar spine radiographs 07/21/2015. Two-view chest x-ray 06/04/2011. FINDINGS: The lumbar spine is imaged from the midbody of T11 through S2. Acute superior endplate fractures present at T12 without significant retropulsion of bone.  Paraspinal hemorrhage is noted without displacement of the aorta. Atherosclerotic calcifications are present in the aorta without aneurysm. There is no significant adenopathy or other focal lesion. Rightward curvature of the thoracolumbar spine is centered at T12-L1. There is leftward curvature at L4-5. Chronic endplate changes are present on the right at L3-4 and L4-5. There chronic endplate changes on the left at T12-L1. Bone island is present at L3. T12-L1: A leftward disc protrusion is present. Left foraminal stenosis is present. L1-2: A broad-based disc protrusion is asymmetric to the left. Left foraminal narrowing is present. L2-3: A broad-based disc protrusion is present. Moderate facet hypertrophy is noted bilaterally. Mild left foraminal narrowing is present. L3-4: There is uncovering of a broad-based disc protrusion. Advanced facet hypertrophy and spurring is noted this leads to moderate to severe central and bilateral foraminal stenosis, left greater than right. L4-5: A broad-based disc protrusion is present. Advanced facet hypertrophy is noted. Spurring leads to moderate foraminal stenosis bilaterally, left greater than right. L5-S1: A shallow central disc protrusion is present. Moderate facet hypertrophy is noted without significant stenosis. IMPRESSION: 1. Superior endplate compression fracture  at T12 with 40% loss of height but no retropulsion of bone. 2. Rightward curvature of the lumbar spine centered at T12-L1. 3. Leftward curvature centered at L4. 4. Left foraminal stenosis at T12-L1, L1-2, and L2-3. 5. Moderate to severe central and bilateral foraminal narrowing at L3-4, left greater than right. 6. Moderate foraminal stenosis bilaterally at L4-5 is worse on the left. Electronically Signed   By: San Morelle M.D.   On: 07/23/2015 19:24   Ct Abdomen Pelvis W Contrast  07/23/2015  CLINICAL DATA:  Abdominal pain with nausea and vomiting for 2 days. EXAM: CT ABDOMEN AND PELVIS WITH CONTRAST  TECHNIQUE: Multidetector CT imaging of the abdomen and pelvis was performed using the standard protocol following bolus administration of intravenous contrast. CONTRAST:  146mL ISOVUE-300 IOPAMIDOL (ISOVUE-300) INJECTION 61% COMPARISON:  None. FINDINGS: Lower chest:  Scarring noted posterior medial right lower lobe. Hepatobiliary: No focal abnormality within the liver parenchyma. There is no evidence for gallstones, gallbladder wall thickening, or pericholecystic fluid. No intrahepatic or extrahepatic biliary dilation. Pancreas: No focal mass lesion. No dilatation of the main duct. No intraparenchymal cyst. No peripancreatic edema. Spleen: No splenomegaly. No focal mass lesion. Adrenals/Urinary Tract: No adrenal nodule or mass. Tiny cortical hypo attenuating lesions in the kidneys bilaterally are too small to characterize but likely represent cysts. No evidence for hydroureter. The urinary bladder appears normal for the degree of distention. Stomach/Bowel: Small hiatal hernia. Stomach otherwise unremarkable. Duodenum is normally positioned as is the ligament of Treitz. No small bowel wall thickening. No small bowel dilatation. Terminal ileum is normal. The appendix is not visualized, but there is no edema or inflammation in the region of the cecum. Diverticular changes are noted in the left colon without evidence of diverticulitis. Vascular/Lymphatic: There is abdominal aortic atherosclerosis without aneurysm. There is no gastrohepatic or hepatoduodenal ligament lymphadenopathy. No intraperitoneal or retroperitoneal lymphadenopathy. No pelvic sidewall lymphadenopathy. Reproductive: The uterus is surgically absent. There is no adnexal mass. Other: No intraperitoneal free fluid. Musculoskeletal: Pelvic floor laxity noted. 8 mm sclerotic lesion left iliac crest presumably a bone island. No other worrisome lytic or sclerotic osseous abnormality. Similar 7 mm sclerotic lesion L3 vertebral body likely a bone island  compression fracture noted at T12 is likely acute and results and anterior loss of vertebral body height. IMPRESSION: 1. No acute intraperitoneal abnormality of the abdomen or pelvis. 2. T12 compression fracture appears acute by CT imaging. 3. Small hiatal hernia. 4. Pelvic floor laxity. Imaging findings of potential clinical significance: Abdominal Aortic Atherosclerois (ICD10-170.0) Electronically Signed   By: Misty Stanley M.D.   On: 07/23/2015 19:20   Ct Hip Left Wo Contrast  07/24/2015  CLINICAL DATA:  Left hip pain since last Thursday.  No known injury. EXAM: CT OF THE LEFT HIP WITHOUT CONTRAST TECHNIQUE: Multidetector CT imaging of the left hip was performed according to the standard protocol. Multiplanar CT image reconstructions were also generated. COMPARISON:  Radiographs 07/21/2015 FINDINGS: Contrast is noted in the bladder likely from previous imaging study. The left hip is normally located. There are advanced degenerative changes with joint space narrowing, osteophytic spurring and subchondral cystic change. No fracture or AVN. The visualized left hemipelvis bony structures are intact. There are degenerative changes noted at the pubic symphysis. No pubic rami fractures. The left hip and pelvic musculature appear normal. No obvious mass, hematoma or edema. IMPRESSION: Advanced left hip joint degenerative changes but no acute bony findings or evidence of AVN. If symptoms persist MR may be helpful for  further evaluation, particularly for a subtle stress fracture or soft tissue abnormality. Electronically Signed   By: Marijo Sanes M.D.   On: 07/24/2015 11:20     CBC  Recent Labs Lab 07/23/15 1516 07/23/15 2234 07/24/15 0541 07/29/15 1410 07/30/15 0553  WBC 16.3* 14.2* 10.3 13.0* 10.7*  HGB 15.0 12.6 12.8 13.8 12.5  HCT 42.9 36.3 37.3 40.4 37.9  PLT 348 309 287 335 263  MCV 87.0 86.0 88.0 88.6 89.0  MCH 30.4 29.9 30.2 30.3 29.3  MCHC 35.0 34.7 34.3 34.2 33.0  RDW 13.2 13.4 13.6 13.7  13.8  LYMPHSABS  --  2.0 2.1  --   --   MONOABS  --  2.3* 1.4*  --   --   EOSABS  --  0.0 0.1  --   --   BASOSABS  --  0.0 0.0  --   --     Chemistries   Recent Labs Lab 07/23/15 1516 07/23/15 1957 07/24/15 0541 07/29/15 1410 07/29/15 1922 07/30/15 0553  NA 122*  --  129* 130* 129* 130*  K 3.3*  --  3.1* 3.6 3.6 2.9*  CL 85*  --  95* 92* 94* 95*  CO2 25  --  25 28 29 26   GLUCOSE 137*  --  105* 133* 125* 119*  BUN 16  --  13 13 13 9   CREATININE 0.77  --  0.68 1.04* 0.86 0.74  CALCIUM 9.6  --  8.9 9.9 9.2 9.1  MG  --  1.6* 1.7  --  1.6*  --   AST 22  --  16  --   --   --   ALT 15  --  11*  --   --   --   ALKPHOS 103  --  77  --   --   --   BILITOT 1.1  --  0.8  --   --   --    ------------------------------------------------------------------------------------------------------------------ estimated creatinine clearance is 40.9 mL/min (by C-G formula based on Cr of 0.74). ------------------------------------------------------------------------------------------------------------------ No results for input(s): HGBA1C in the last 72 hours. ------------------------------------------------------------------------------------------------------------------ No results for input(s): CHOL, HDL, LDLCALC, TRIG, CHOLHDL, LDLDIRECT in the last 72 hours. ------------------------------------------------------------------------------------------------------------------ No results for input(s): TSH, T4TOTAL, T3FREE, THYROIDAB in the last 72 hours.  Invalid input(s): FREET3 ------------------------------------------------------------------------------------------------------------------ No results for input(s): VITAMINB12, FOLATE, FERRITIN, TIBC, IRON, RETICCTPCT in the last 72 hours.  Coagulation profile No results for input(s): INR, PROTIME in the last 168 hours.  No results for input(s): DDIMER in the last 72 hours.  Cardiac Enzymes No results for input(s): CKMB, TROPONINI, MYOGLOBIN  in the last 168 hours.  Invalid input(s): CK ------------------------------------------------------------------------------------------------------------------ Invalid input(s): POCBNP   CBG: No results for input(s): GLUCAP in the last 168 hours.     Studies: Dg Chest 2 View  07/29/2015  CLINICAL DATA:  Altered mental status 1 day. EXAM: CHEST  2 VIEW COMPARISON:  06/04/2011 and abdominal pelvic CT 07/23/2015 FINDINGS: Lungs are adequately inflated without consolidation or effusion. Borderline cardiomegaly unchanged. There are degenerative changes of the spine. There is a moderate compression fracture of T12 new since 2013 and unchanged from the recent CT. Mild loss of height of 2 adjacent mid thoracic vertebral bodies these indeterminate but new since 2013. IMPRESSION: No acute cardiopulmonary disease. Moderate T12 compression fracture unchanged from the recent CT and new since 2013. Mild vertebral body height loss of 2 adjacent mid thoracic vertebral bodies age indeterminate. Electronically Signed   By: Marin Olp M.D.  On: 07/29/2015 16:15   Ct Head Wo Contrast  07/29/2015  CLINICAL DATA:  Confusion as well as diarrhea.  Hyponatremia. EXAM: CT HEAD WITHOUT CONTRAST TECHNIQUE: Contiguous axial images were obtained from the base of the skull through the vertex without intravenous contrast. COMPARISON:  None. FINDINGS: The ventricles, cisterns and other CSF spaces are within normal. There is minimal chronic ischemic microvascular disease. There is no mass, mass effect, shift of midline structures or acute hemorrhage. No evidence of acute infarction. Remaining bones and soft tissues are within normal. IMPRESSION: No acute intracranial findings. Subtle chronic ischemic microvascular disease. Electronically Signed   By: Marin Olp M.D.   On: 07/29/2015 15:55      No results found for: HGBA1C Lab Results  Component Value Date   CREATININE 0.74 07/30/2015       Scheduled Meds: .  atenolol  50 mg Oral Daily  . calcitonin (salmon)  1 spray Alternating Nares Daily  . cefTRIAXone (ROCEPHIN)  IV  1 g Intravenous Q24H  . DULoxetine  60 mg Oral Daily  . enoxaparin (LOVENOX) injection  40 mg Subcutaneous Q24H  . famotidine (PEPCID) IV  20 mg Intravenous Q12H  . ketorolac  15 mg Intravenous Q12H  . lidocaine  1 patch Transdermal Q24H  . losartan  100 mg Oral Daily  . potassium chloride  40 mEq Oral TID  . traZODone  75 mg Oral QHS   Continuous Infusions: . sodium chloride 75 mL/hr at 07/29/15 1801     LOS: 1 day    Time spent: >30 MINS    [facility]  [facility] Pager [phone]. If 7PM-7AM, please contact night-coverage at [email or web address], password [facility] 07/30/2015, 8:02 AM  LOS: 1 day

## 2015-07-30 NOTE — Progress Notes (Signed)
OT Cancellation Note  Patient Details Name: Kess Fetty MRN: [id number] DOB: [birth year]   Cancelled Treatment:    Reason Eval/Treat Not Completed: Fatigue/lethargy limiting ability to participate;Medical issues which prohibited therapy. Pt difficult to arouse, resting HR up to 187 and BP 203/126. RN notified of vitals  Britt Bottom 07/30/2015, 12:48 PM

## 2015-07-30 NOTE — Progress Notes (Signed)
PT Cancellation Note  Patient Details Name: [name] MRN: [id number] DOB: [birth year]   Cancelled Treatment:    Reason Eval/Treat Not Completed: Medical issues which prohibited therapy.  Cpntacted  nursing to let him know 203/126 and pulse up to 187 with downward trend afterward to 97.   Ramond Dial 07/30/2015, 12:50 PM    Mee Hives, PT MS Acute Rehab Dept. Number: [place]

## 2015-07-30 NOTE — Progress Notes (Signed)
CRITICAL VALUE ALERT  Critical value received:  K 2.9  Date of notification:  07/30/2015  Time of notification:  7:41 AM  Critical value read back:Yes.    Nurse who received alert:  Retta Mac  MD notified (1st page):  Dr. Allyson Sabal  Time of first page:  0740  MD notified (2nd page):  Time of second page:  Responding MD:  Dr. Allyson Sabal  Time MD responded:  [phone]

## 2015-07-30 NOTE — Clinical Social Work Note (Signed)
Consult received for SNF placement. Completed assessment with patient's daughter, [name] at the bedside and full assessment to follow. Daughter requested LTC placement for patient as she can no longer care for her at home. CSW will initiate facility search 7/20.  Clarie Camey Givens, MSW, LCSW Licensed Clinical Social Worker [place] [phone]

## 2015-07-30 NOTE — Progress Notes (Signed)
Late Entry for 07/30/15 115pm.  Called by Charge Nurse to go and speak with patients daughter regarding care concerns. Patients daugther verbalized concerns that patient was not given her morning medications, and she was not informed of this. Also upset that patients blood pressure was now high and felt that nothing was being done to manage it. Also concerned that patient had not eaten breakfast. And that IV was out.   Reviewed patient record and notified patients daughter that per the report of the patients nurse Lurena Joiner, patient was unable to swallow morning medications without spittingthem up this morning. Stated that he notified Dr. Allyson Sabal and was awaiting orders.Also stated that he had placed an IV therapy consult for an IV restart. Upon reviewing orders it was noted that an EKG, IV blood pressure medication, telemetry, and swallow evaluation.   As I was speaking with daughter, IV therapy RN entered room to resume IV and RN Lurena Joiner entered to administer IV medication. Also called Speech therapy to determine how long it would e before the [place] would be done. Stated there were a few patients ahead of patient on the list.   Daughter informed of the above.  Eivan Gallina, Bryn Gulling

## 2015-07-31 DIAGNOSIS — Z7189 Other specified counseling: Secondary | ICD-10-CM

## 2015-07-31 DIAGNOSIS — Z515 Encounter for palliative care: Secondary | ICD-10-CM | POA: Insufficient documentation

## 2015-07-31 DIAGNOSIS — R159 Full incontinence of feces: Secondary | ICD-10-CM | POA: Insufficient documentation

## 2015-07-31 DIAGNOSIS — B370 Candidal stomatitis: Secondary | ICD-10-CM | POA: Insufficient documentation

## 2015-07-31 DIAGNOSIS — B3781 Candidal esophagitis: Secondary | ICD-10-CM

## 2015-07-31 LAB — COMPREHENSIVE METABOLIC PANEL
ALT: 11 U/L — ABNORMAL LOW (ref 14–54)
AST: 15 U/L (ref 15–41)
Albumin: 3.5 g/dL (ref 3.5–5.0)
Alkaline Phosphatase: 88 U/L (ref 38–126)
Anion gap: 5 (ref 5–15)
BUN: 18 mg/dL (ref 6–20)
CO2: 24 mmol/L (ref 22–32)
Calcium: 9.6 mg/dL (ref 8.9–10.3)
Chloride: 104 mmol/L (ref 101–111)
Creatinine, Ser: 0.8 mg/dL (ref 0.44–1.00)
GFR calc Af Amer: >60 mL/min (ref >60)
GFR calc non Af Amer: >60 mL/min (ref >60)
Glucose, Bld: 111 mg/dL — ABNORMAL HIGH (ref 65–99)
Potassium: 4.1 mmol/L (ref 3.5–5.1)
Sodium: 133 mmol/L — ABNORMAL LOW (ref 135–145)
Total Bilirubin: 0.4 mg/dL (ref 0.3–1.2)
Total Protein: 6.3 g/dL — ABNORMAL LOW (ref 6.5–8.1)

## 2015-07-31 LAB — CBC
HCT: 40.3 % (ref 36.0–46.0)
Hemoglobin: 13.3 g/dL (ref 12.0–15.0)
MCH: 29.6 pg (ref 26.0–34.0)
MCHC: 33 g/dL (ref 30.0–36.0)
MCV: 89.8 fL (ref 78.0–100.0)
Platelets: 305 10*3/uL (ref 150–400)
RBC: 4.49 MIL/uL (ref 3.87–5.11)
RDW: 14.1 % (ref 11.5–15.5)
WBC: 10.9 10*3/uL — ABNORMAL HIGH (ref 4.0–10.5)

## 2015-07-31 MED ORDER — TRAMADOL HCL 50 MG PO TABS
50.0000 mg | ORAL_TABLET | Freq: Two times a day (BID) | ORAL | Status: DC
  Administered 2015-07-31 (×2): 50 mg via ORAL
  Filled 2015-07-31 (×2): qty 1

## 2015-07-31 MED ORDER — CYCLOBENZAPRINE HCL 5 MG PO TABS
5.0000 mg | ORAL_TABLET | Freq: Three times a day (TID) | ORAL | Status: DC
  Administered 2015-07-31 (×3): 5 mg via ORAL
  Filled 2015-07-31 (×3): qty 1

## 2015-07-31 MED ORDER — TRAMADOL HCL 50 MG PO TABS
50.0000 mg | ORAL_TABLET | Freq: Four times a day (QID) | ORAL | Status: DC | PRN
  Administered 2015-08-01 (×2): 50 mg via ORAL
  Filled 2015-07-31 (×2): qty 1

## 2015-07-31 MED ORDER — FAMOTIDINE 20 MG PO TABS
20.0000 mg | ORAL_TABLET | Freq: Every day | ORAL | Status: DC
  Administered 2015-08-01: 20 mg via ORAL
  Filled 2015-07-31: qty 1

## 2015-07-31 NOTE — Evaluation (Signed)
Physical Therapy Evaluation Patient Details Name: [name] MRN: [id number] DOB: December 31, 1921 Today's Date: 07/31/2015   History of Present Illness  Patient is a 82 yo female admitted 07/29/15 with acute encephalopathy, hypertensive urgency, UTI.  Patient with recent spontaneous T12 compression fx and has TLSO brace.   PMH:  HTN, depression, anxiety, arthritis, chronic back pain, spontaneous T12 compression fracture discharged from hospital 07/24/15  Clinical Impression  Patient presents with problems listed below.  Will benefit from acute PT to maximize functional mobility prior to discharge.  Recommend SNF at d/c for continued therapy.    Follow Up Recommendations SNF;Supervision/Assistance - 24 hour    Equipment Recommendations  None recommended by PT    Recommendations for Other Services       Precautions / Restrictions Precautions Precautions: Fall Restrictions Weight Bearing Restrictions: No      Mobility  Bed Mobility Overal bed mobility: Needs Assistance Bed Mobility: Rolling;Sidelying to Sit;Sit to Sidelying Rolling: Min assist Sidelying to sit: Min assist     Sit to sidelying: Min assist General bed mobility comments: Verbal and tactile cues to use log rolling to move to sitting.  Once upright, patient's pain increased to 10/10.  Encouraged patient to focus on breathing.  Max assist to don and doff brace.  Assist to move to sidelying to supine.  Transfers Overall transfer level: Needs assistance Equipment used: Rolling walker (2 wheeled) Transfers: Sit to/from Stand Sit to Stand: Min assist         General transfer comment: Assist to rise to standing.  Patient leaning forward in flexed posture onto RW.  Cues to stand upright.  Patient able to stand 45 seconds.  Returned to supine due to pain.  Noted increased BP following stance of 213/88.  RN called to room.  After several minutes of rest, BP at 194/77.  RN aware BP remaining up.  Ambulation/Gait              General Gait Details: Unable due to pain  Stairs            Wheelchair Mobility    Modified Rankin (Stroke Patients Only)       Balance Overall balance assessment: Needs assistance         Standing balance support: Bilateral upper extremity supported Standing balance-Leahy Scale: Poor                               Pertinent Vitals/Pain Pain Assessment: 0-10 Pain Score: 10-Worst pain ever (with mobility) Pain Location: back Pain Descriptors / Indicators: Grimacing;Guarding;Sharp;Sore Pain Intervention(s): Limited activity within patient's tolerance;Monitored during session;Repositioned;Patient requesting pain meds-RN notified;RN gave pain meds during session    [place] expects to be discharged to:: Skilled nursing facility Living Arrangements: Children Available Help at Discharge: Family;Available 24 hours/day Type of Home: House Home Access: Stairs to enter   [employer] of Steps: 2 Home Layout: Two level Home Equipment: Cane - single point;Transport chair;Walker - 2 wheels;Walker - 4 wheels;Shower seat;Bedside commode;Hospital bed      Prior Function Level of Independence: Independent with assistive device(s) (Prior to compression fracture)         Comments: uses cane for ambulation prior to compression fracture     Hand Dominance        Extremity/Trunk Assessment   Upper Extremity Assessment: Generalized weakness           Lower Extremity Assessment: Generalized weakness  Cervical / Trunk Assessment: Kyphotic  Communication   Communication: No difficulties  Cognition Arousal/Alertness: Awake/alert Behavior During Therapy: WFL for tasks assessed/performed;Anxious Overall Cognitive Status: Within Functional Limits for tasks assessed (Some difficulty sequencing; Ox4)                      General Comments      Exercises        Assessment/Plan    PT Assessment Patient  needs continued PT services  PT Diagnosis Difficulty walking;Generalized weakness;Acute pain;Altered mental status   PT Problem List Decreased strength;Decreased activity tolerance;Decreased balance;Decreased mobility;Decreased cognition;Decreased knowledge of use of DME;Decreased knowledge of precautions;Pain  PT Treatment Interventions DME instruction;Gait training;Functional mobility training;Therapeutic activities;Balance training;Cognitive remediation;Patient/family education   PT Goals (Current goals can be found in the Care Plan section) Acute Rehab PT Goals Patient Stated Goal: To stop hurting PT Goal Formulation: With patient/family Time For Goal Achievement: 08/07/15 Potential to Achieve Goals: Fair    Frequency Min 3X/week   Barriers to discharge        Co-evaluation               End of Session Equipment Utilized During Treatment: Gait belt;Back brace Activity Tolerance: Patient limited by pain Patient left: in bed;with call bell/phone within reach;with bed alarm set;with nursing/sitter in room;with family/visitor present Nurse Communication: Mobility status;Patient requests pain meds (Elevated BP)         Time: [id number] PT Time Calculation (min) (ACUTE ONLY): 37 min   Charges:   PT Evaluation $PT Eval Moderate Complexity: 1 Procedure PT Treatments $Therapeutic Activity: 8-22 mins   PT G Codes:        Despina Pole 2015/08/22, 1:48 PM Carita Pian. Sanjuana Kava, [place] Pager [phone]

## 2015-07-31 NOTE — Evaluation (Signed)
Occupational Therapy Evaluation Patient Details Name: [name] MRN: [id number] DOB: May 07, 1921 Today's Date: 07/31/2015    History of Present Illness Patient is a 82 yo female admitted 07/29/15 with acute encephalopathy, hypertensive urgency, UTI.  Patient with recent spontaneous T12 compression fx and has TLSO brace.   PMH:  HTN, depression, anxiety, arthritis, chronic back pain, spontaneous T12 compression fracture discharged from hospital 07/24/15   Clinical Impression   Pt presents with increased back pain when assisted to sitting at EOB. Pt is able to perform self feeding and grooming at bed level. She requires assistance for bathing, dressing and toileting. Assisted pt with positioning on her side to decrease pain, encouraging log roll technique to protect her back. Pt aware she has a UTI and asking if it has improved. Will need rehab in SNF upon discharge. Will defer further OT to SNF.    Follow Up Recommendations  SNF;Supervision/Assistance - 24 hour    Equipment Recommendations       Recommendations for Other Services       Precautions / Restrictions Precautions Precautions: Fall Required Braces or Orthoses: Spinal Brace Spinal Brace: Thoracolumbosacral orthotic Restrictions Weight Bearing Restrictions: No      Mobility Bed Mobility Overal bed mobility: Needs Assistance Bed Mobility: Sidelying to Sit;Sit to Sidelying Rolling: Supervision Sidelying to sit: Min assist     Sit to sidelying: Min assist General bed mobility comments: Pt not tolerating sitting due to pain, returned immediately to supine due to pain elevated with PT. Positioned pt on her L side with pillow between knees and pillow to her back, reported improvement in comfort  Transfers     Balance Overall balance assessment: Needs assistance Sitting-balance support: Feet supported Sitting balance-Leahy Scale: Poor                                   ADL Overall ADL's : Needs  assistance/impaired Eating/Feeding: Set up;Sitting   Grooming: Wash/dry hands;Wash/dry face;Brushing hair;Sitting;Supervision/safety   Upper Body Bathing: Minimal assitance;Sitting   Lower Body Bathing: Total assistance;Sit to/from stand   Upper Body Dressing : Minimal assistance;Sitting   Lower Body Dressing: Total assistance;Sit to/from stand                       Vision     Perception     Praxis      Pertinent Vitals/Pain Pain Assessment: Faces Pain Score: 10-Worst pain ever (with mobility) Faces Pain Scale: Hurts worst Pain Location: back Pain Descriptors / Indicators: Grimacing;Guarding;Aching Pain Intervention(s): Limited activity within patient's tolerance;Monitored during session;Premedicated before session;Repositioned     Hand Dominance Right   Extremity/Trunk Assessment Upper Extremity Assessment Upper Extremity Assessment: Generalized weakness   Lower Extremity Assessment Lower Extremity Assessment: Defer to PT evaluation   Cervical / Trunk Assessment Cervical / Trunk Assessment: Kyphotic   Communication Communication Communication: No difficulties   Cognition Arousal/Alertness: Awake/alert Behavior During Therapy: Anxious Overall Cognitive Status: History of cognitive impairments - at baseline                     General Comments       Exercises       Shoulder Instructions      Home Living Family/patient expects to be discharged to:: Skilled nursing facility Living Arrangements: Children Available Help at Discharge: Family;Available 24 hours/day Type of Home: House Home Access: Stairs to enter [employer] of Steps: 2  Home Layout: Two level Alternate Level Stairs-Number of Steps: 1 flight             Home Equipment: Cane - single [employer] - 2 wheels;Walker - 4 wheels;Shower seat;Bedside commode;Hospital bed          Prior Functioning/Environment Level of Independence:  Independent with assistive device(s) (prior to compression fx)        Comments: uses cane for ambulation prior to compression fracture    OT Diagnosis: Generalized weakness;Acute pain;Cognitive deficits   OT Problem List: Decreased activity tolerance;Decreased strength;Impaired balance (sitting and/or standing);Decreased cognition;Decreased knowledge of use of DME or AE;Pain   OT Treatment/Interventions:      OT Goals(Current goals can be found in the care plan section) Acute Rehab OT Goals Patient Stated Goal: To stop hurting  OT Frequency:     Barriers to D/C:            Co-evaluation              End of Session    Activity Tolerance: Patient limited by pain Patient left: in bed;with call bell/phone within reach;with bed alarm set   Time: 1410-1425 OT Time Calculation (min): 15 min Charges:  OT General Charges $OT Visit: 1 Procedure OT Evaluation $OT Eval Moderate Complexity: 1 Procedure G-Codes:    Malka So 07/31/2015, 2:33 PM  [phone]

## 2015-07-31 NOTE — Progress Notes (Signed)
Daily Progress Note   Patient Name: [name]       Date: 07/31/2015 DOB: Aug 28, 1921  Age: 82 y.o. MRN#: [id number] Attending Physician: Reyne Dumas, MD Primary Care Physician: Rachell Cipro, MD Admit Date: 07/29/2015  Reason for Consultation/Follow-up: Disposition, Establishing goals of care and Non pain symptom management  Subjective:  Patient seen today lying in bed. Continues to be pleasantly confused. [name] is improving. Pain is controlled. Plan is for d/c to SNF with palliative care f/u outpatient. [facility] has been contacted and will follow patient at discharge.    Length of Stay: 2  Current Medications: Scheduled Meds:  . atenolol  50 mg Oral BID  . calcitonin (salmon)  1 spray Alternating Nares Daily  . cefTRIAXone (ROCEPHIN)  IV  1 g Intravenous Q24H  . cyclobenzaprine  5 mg Oral TID  . DULoxetine  60 mg Oral Daily  . enoxaparin (LOVENOX) injection  40 mg Subcutaneous Q24H  . [START ON 08/01/2015] famotidine  20 mg Oral Daily  . ketorolac  15 mg Intravenous Q12H  . lidocaine  1 patch Transdermal Q24H  . losartan  100 mg Oral Daily  . nystatin  5 mL Oral QID  . potassium chloride  40 mEq Oral TID  . traMADol  50 mg Oral Q12H  . traZODone  75 mg Oral QHS    Continuous Infusions: . sodium chloride 75 mL/hr at 07/31/15 1100    PRN Meds: acetaminophen **OR** acetaminophen, hydrALAZINE, polyvinyl alcohol, traMADol  Physical Exam          Vital Signs: BP 178/84 mmHg  Pulse 96  Temp(Src) 98 F (36.7 C) (Oral)  Resp 19  Ht 5\' 3"  (1.6 m)  Wt 63.504 kg (140 lb)  BMI 24.81 kg/m2  SpO2 99% SpO2: SpO2: 99 % O2 Device: O2 Device: Not Delivered O2 Flow Rate:    Intake/output summary:  Intake/Output Summary (Last 24 hours) at 07/31/15 1219 Last data  filed at 07/31/15 1100  Gross per 24 hour  Intake   2675 ml  Output      0 ml  Net   2675 ml   LBM: Last BM Date: 07/30/15 Baseline Weight: Weight: 65.772 kg (145 lb) Most recent weight: Weight: 63.504 kg (140 lb)       Palliative Assessment/Data:    Flowsheet Rows  Most Recent Value   Intake Tab    Referral Department  Hospitalist   Unit at Time of Referral  Med/Surg Unit   Palliative Care Primary Diagnosis  Sepsis/Infectious Disease   Date Notified  07/30/15   Palliative Care Type  New Palliative care   Reason for referral  Clarify Goals of Care   Date of Admission  07/29/15   Date first seen by Palliative Care  07/30/15   # of days IP prior to Palliative referral  1   Clinical Assessment    Palliative Performance Scale Score  60%   Psychosocial & Spiritual Assessment    Palliative Care Outcomes       Patient Active Problem List   Diagnosis Date Noted  . Complex care coordination   . Palliative care by specialist   . Advance care planning   . Thrush of mouth and esophagus ([place])   . Fecal incontinence   . Acute encephalopathy 07/29/2015  . Chronic diarrhea 07/29/2015  . Chronic hyponatremia 07/23/2015  . Hypokalemia 07/23/2015  . Hypomagnesemia 07/23/2015  . Acute UTI 07/23/2015  . Depression 07/23/2015  . Abdominal pain 07/23/2015  . Back pain 07/23/2015  . Compression fracture of thoracic vertebra ([facility]) 07/23/2015  . Insomnia 08/26/2011  . Endometrial ca ([place]) 05/12/2011  . Left knee pain 04/07/2011  . Calf pain 04/07/2011  . Essential hypertension 01/15/2010  . ARTHRITIS 01/15/2010  . DIVERTICULITIS, HX OF 01/15/2010    Palliative Care Assessment & Plan   Patient Profile: 82 y.o. female with past medical history of HTN, Arthritis, endometrial ca, chronic diarrhea and back pain admitted on 07/29/2015 withmild hyponatremia and confusion likely secondary to UTI. This is her second hospitalization in the past six months. She was previously  hospitalized in early July with back pain and was found to have a T12 compression fracture, she is not a candidate for kyphoplasty. She has otherwise prior to those admissions been healthy, independent with ADL's, and was living with her daughter, [name], who is HCPOA. Consult was requested by daughter [name] to discuss Advance Care planning and goals of care.  Assessment/Plan: Confusion- pleasant, not improved Thrush- improving, continue Nystatin Pain- controlled with current regimen, recommend Decadron 1mg  BID after UTI clears and Toradol is stopped.    Goals of Care and Additional Recommendations:  Limitations on Scope of Treatment: Avoid Hospitalization, Minimize Medications, No Artificial Feeding and No Hemodialysis  Code Status:    Code Status Orders        Start     Ordered   07/29/15 1852  Do not attempt resuscitation (DNR)   Continuous    Question Answer Comment  In the event of cardiac or respiratory ARREST Do not call a "code blue"   In the event of cardiac or respiratory ARREST Do not perform Intubation, CPR, defibrillation or ACLS   In the event of cardiac or respiratory ARREST Use medication by any route, position, wound care, and other measures to relive pain and suffering. May use oxygen, suction and manual treatment of airway obstruction as needed for comfort.      07/29/15 1851    Code Status History    Date Active Date Inactive Code Status Order ID Comments User Context   07/23/2015 11:09 PM 07/24/2015  6:55 PM Full Code [id number]  Reubin Milan, MD Inpatient   06/08/2011 11:07 AM 06/10/2011  1:03 PM Full Code [id number]  Rebbeca Paul, RN Inpatient    Advance Directive Documentation  Most Recent Value   Type of Advance Directive  Living will   Pre-existing out of facility DNR order (yellow form or pink MOST form)     "MOST" Form in Place?         Prognosis:   < 12 months d/t decline in patient's functional status r/t hip fracture in advanced  age, disposition to skilled nursing facility, decreased cognitive status, and decreased po intake  Discharge Planning:  [place] with Palliative Care for symptom management- pain; probably transition to Hospice within a few months  Care plan was discussed with patient and [name]  Thank you for allowing the Palliative Medicine Team to assist in the care of this patient.Will continue to follow as needed.   Time In: 1210 Time Out: 1225 Total Time 15 min Prolonged Time Billed  No      Greater than 50%  of this time was spent counseling and coordinating care related to the above assessment and plan.  Mariana Kaufman, [place] [phone] [phone]- [phone]  Please contact Palliative Medicine Team phone at [phone] for questions and concerns.

## 2015-07-31 NOTE — Progress Notes (Signed)
Patient's BP 193/90. PRN 10mg  of Hydralazine administered. BP recheck 140/62. RN will continue to monitor patient.   Ermalinda Memos, RN

## 2015-07-31 NOTE — Progress Notes (Signed)
[facility] PROGRESS NOTE  Moo Holzmann [id number] DOB: 1921-02-01 DOA: 07/29/2015   PCP: Rachell Cipro, MD     Assessment/Plan: Principal Problem:   Acute encephalopathy Active Problems:   Essential hypertension   Chronic hyponatremia   Acute UTI   Depression   Compression fracture of thoracic vertebra (HCC)   Chronic diarrhea   82 y.o. female with a Past Medical History of hypertension, diverticulosis, endometrial cancer, depression who presents with very mild hyponatremia and confusion likely secondary to UTI.Lab data: Sodium 130, potassium 3.6, BUN 13, creatinine 1.04, glucose 133, WBC 13,000 differential not obtained, hemoglobin 13.8, platelets 335,000; urinalysis concerning for UTI-culture pending  Assessment and plan Acute toxic encephalopathy    -Suspect multifactorial etiology secondary to acute UTI /hyponatremia/pain medications -Treat underlying causes -Daughter expresses concerns over ability to care for patient at home so we have requested SW to evaluate for skilled nursing facility placement She also requested a palliative care consultation for goals of care-pending Patient has long-term care insurance and would like to be placed in a nursing home   Hypertensive urgency, improving Started on hydralazine as needed iv  Patient is also on atenolol, dose increased to 50 mg twice a day Continue Cozaar   Chronic hyponatremia improving -Sodium at discharge was 129 with a nadir during that previous admission of 122 -Today sodium is 133  during this is an improvement since previous hydrochlorothiazide discontinued -Patient actually appears dehydrated so will continue normal saline at 75 mL per hour   Acute UTI -Urinalysis highly abnormal and given leukocytosis and altered mentation suspect patient does have a urinary tract infection Continue Rocephin, urine culture pending If no growth by tomorrow will switch patient to cefuroxime for 7 days     Compression fracture of thoracic vertebra  -Patient endorses continued pain but did well with passive straight leg raise in bed-PT eval pending -Continue calcitonin nasal spray -Because of acute encephalopathy we are holding Vicodin and other narcotics for now -Continue Tylenol -Scheduled Toradol 15 mg IV every 12 hours with twice a day IV Pepcid following renal function closely -Lidoderm patch to T12/L1 region -PT/OT evaluation pending -Apply TLSO brace, discharged to SNF tomorrow Started flexeril and tramdol for better pain control    Depression -Patient no longer takes Celexa -Continue Cymbalta as well as trazodone at bedtime   Chronic diarrhea -Patient has undergone multiple workups with out any etiology to diarrhea other than known lactose intolerance -Continue lactose-free diet     DVT prophylaxsis Lovenox  Code Status:  DO NOT RESUSCITATE    Family Communication: Discussed in detail with the patient's daughter, all imaging results, lab results explained to the patient   Disposition Plan:  Daughter requesting palliative care consult for goals of care, she is conservative in her approach, anticipate discharge to SNF tomorrow    Consultants:  Palliative care  Procedures:  None   Antibiotics: Anti-infectives    Start     Dose/Rate Route Frequency Ordered Stop   07/29/15 1745  cefTRIAXone (ROCEPHIN) 1 g in dextrose 5 % 50 mL IVPB     1 g 100 mL/hr over 30 Minutes Intravenous Every 24 hours 07/29/15 1732           HPI/Subjective: Patient seems a little confused intermittently , patient's daughter is by the bedside , overall more awake and alert    Objective: Filed Vitals:   07/30/15 2101 07/31/15 0544 07/31/15 0625 07/31/15 0812  BP: 155/84 193/90 140/62 178/84  Pulse:  119 68 78 96  Temp: 98.4 F (36.9 C) 97.7 F (36.5 C)  98 F (36.7 C)  TempSrc: Oral Oral  Oral  Resp: 18 16  19   Height:      Weight: 63.504 kg (140 lb)     SpO2: 97% 100%  99%     Intake/Output Summary (Last 24 hours) at 07/31/15 [id number] Last data filed at 07/31/15 0600  Gross per 24 hour  Intake   2360 ml  Output    350 ml  Net   2010 ml    Exam:  Examination:  General exam: Appears calm and comfortable  Respiratory system: Clear to auscultation. Respiratory effort normal. Cardiovascular system: S1 & S2 heard, RRR. No JVD, murmurs, rubs, gallops or clicks. No pedal edema. Gastrointestinal system: Abdomen is nondistended, soft and nontender. No organomegaly or masses felt. Normal bowel sounds heard. Central nervous system: Alert and oriented. No focal neurological deficits. Extremities: Symmetric 5 x 5 power. Skin: No rashes, lesions or ulcers Psychiatry: Judgement and insight Impaired. Mood & affect appropriate.     Data Reviewed: I have personally reviewed following labs and imaging studies  Micro Results Recent Results (from the past 240 hour(s))  Gastrointestinal Panel by PCR , Stool     Status: None   Collection Time: 07/24/15  5:05 AM  Result Value Ref Range Status   Campylobacter species NOT DETECTED NOT DETECTED Final   Plesimonas shigelloides NOT DETECTED NOT DETECTED Final   Salmonella species NOT DETECTED NOT DETECTED Final   Yersinia enterocolitica NOT DETECTED NOT DETECTED Final   Vibrio species NOT DETECTED NOT DETECTED Final   Vibrio cholerae NOT DETECTED NOT DETECTED Final   Enteroaggregative E coli (EAEC) NOT DETECTED NOT DETECTED Final   Enteropathogenic E coli (EPEC) NOT DETECTED NOT DETECTED Final   Enterotoxigenic E coli (ETEC) NOT DETECTED NOT DETECTED Final   Shiga like toxin producing E coli (STEC) NOT DETECTED NOT DETECTED Final   E. coli O157 NOT DETECTED NOT DETECTED Final   Shigella/Enteroinvasive E coli (EIEC) NOT DETECTED NOT DETECTED Final   Cryptosporidium NOT DETECTED NOT DETECTED Final   Cyclospora cayetanensis NOT DETECTED NOT DETECTED Final   Entamoeba histolytica NOT DETECTED NOT DETECTED Final   Giardia  lamblia NOT DETECTED NOT DETECTED Final   Adenovirus F40/41 NOT DETECTED NOT DETECTED Final   Astrovirus NOT DETECTED NOT DETECTED Final   Norovirus GI/GII NOT DETECTED NOT DETECTED Final   Rotavirus A NOT DETECTED NOT DETECTED Final   Sapovirus (I, II, IV, and V) NOT DETECTED NOT DETECTED Final    Radiology Reports Dg Chest 2 View  07/29/2015  CLINICAL DATA:  Altered mental status 1 day. EXAM: CHEST  2 VIEW COMPARISON:  06/04/2011 and abdominal pelvic CT 07/23/2015 FINDINGS: Lungs are adequately inflated without consolidation or effusion. Borderline cardiomegaly unchanged. There are degenerative changes of the spine. There is a moderate compression fracture of T12 new since 2013 and unchanged from the recent CT. Mild loss of height of 2 adjacent mid thoracic vertebral bodies these indeterminate but new since 2013. IMPRESSION: No acute cardiopulmonary disease. Moderate T12 compression fracture unchanged from the recent CT and new since 2013. Mild vertebral body height loss of 2 adjacent mid thoracic vertebral bodies age indeterminate. Electronically Signed   By: Marin Olp M.D.   On: 07/29/2015 16:15   Dg Lumbar Spine Complete  07/21/2015  CLINICAL DATA:  Low back pain for the past 5 days radiating into the left hip without known injury EXAM:  LUMBAR SPINE - COMPLETE 4+ VIEW COMPARISON:  None in PACs FINDINGS: There is moderate dextrocurvature of the lower thoracic and lumbar spine centered at L1-2. The pedicles and transverse processes of the lumbar spine are intact. There is no compression fracture or spondylolisthesis. There is moderate to severe degenerative narrowing of the L3-4 and L4-5 disc spaces with mild to moderate narrowing at L1-2 and L2-3. The L5-S1 disc space is normal. There is facet joint hypertrophy from L3-S1. IMPRESSION: Multilevel moderate to severe degenerative disc and facet joint disease as described. No compression fracture. Mild levocurvature of the thoracolumbar spine. Move  Electronically Signed   By: David  Martinique M.D.   On: 07/21/2015 14:46   Dg Pelvis 1-2 Views  07/21/2015  CLINICAL DATA:  Five days of low back pain radiating into the left hip without known injury EXAM: PELVIS - 1-2 VIEW COMPARISON:  None in PACs FINDINGS: The bony pelvis is subjectively osteopenic. There is no lytic or blastic lesion or acute fracture. There is mild narrowing of the hip joint spaces bilaterally slightly greater on the right than on the left. The femoral necks, intertrochanteric, and subtrochanteric regions are grossly normal. The bowel gas pattern is unremarkable. IMPRESSION: There is mild to moderate osteoarthritic change of both hips. There is no acute bony abnormality of the pelvis. Electronically Signed   By: David  Martinique M.D.   On: 07/21/2015 14:47   Ct Head Wo Contrast  07/29/2015  CLINICAL DATA:  Confusion as well as diarrhea.  Hyponatremia. EXAM: CT HEAD WITHOUT CONTRAST TECHNIQUE: Contiguous axial images were obtained from the base of the skull through the vertex without intravenous contrast. COMPARISON:  None. FINDINGS: The ventricles, cisterns and other CSF spaces are within normal. There is minimal chronic ischemic microvascular disease. There is no mass, mass effect, shift of midline structures or acute hemorrhage. No evidence of acute infarction. Remaining bones and soft tissues are within normal. IMPRESSION: No acute intracranial findings. Subtle chronic ischemic microvascular disease. Electronically Signed   By: Marin Olp M.D.   On: 07/29/2015 15:55   Ct Lumbar Spine Wo Contrast  07/23/2015  CLINICAL DATA:  Abdominal pain. Low back pain with nausea and vomiting for 2 days. EXAM: CT LUMBAR SPINE WITHOUT CONTRAST TECHNIQUE: Multidetector CT imaging of the lumbar spine was performed without intravenous contrast administration. Multiplanar CT image reconstructions were also generated. COMPARISON:  Lumbar spine radiographs 07/21/2015. Two-view chest x-ray 06/04/2011.  FINDINGS: The lumbar spine is imaged from the midbody of T11 through S2. Acute superior endplate fractures present at T12 without significant retropulsion of bone. Paraspinal hemorrhage is noted without displacement of the aorta. Atherosclerotic calcifications are present in the aorta without aneurysm. There is no significant adenopathy or other focal lesion. Rightward curvature of the thoracolumbar spine is centered at T12-L1. There is leftward curvature at L4-5. Chronic endplate changes are present on the right at L3-4 and L4-5. There chronic endplate changes on the left at T12-L1. Bone island is present at L3. T12-L1: A leftward disc protrusion is present. Left foraminal stenosis is present. L1-2: A broad-based disc protrusion is asymmetric to the left. Left foraminal narrowing is present. L2-3: A broad-based disc protrusion is present. Moderate facet hypertrophy is noted bilaterally. Mild left foraminal narrowing is present. L3-4: There is uncovering of a broad-based disc protrusion. Advanced facet hypertrophy and spurring is noted this leads to moderate to severe central and bilateral foraminal stenosis, left greater than right. L4-5: A broad-based disc protrusion is present. Advanced facet hypertrophy is noted.  Spurring leads to moderate foraminal stenosis bilaterally, left greater than right. L5-S1: A shallow central disc protrusion is present. Moderate facet hypertrophy is noted without significant stenosis. IMPRESSION: 1. Superior endplate compression fracture at T12 with 40% loss of height but no retropulsion of bone. 2. Rightward curvature of the lumbar spine centered at T12-L1. 3. Leftward curvature centered at L4. 4. Left foraminal stenosis at T12-L1, L1-2, and L2-3. 5. Moderate to severe central and bilateral foraminal narrowing at L3-4, left greater than right. 6. Moderate foraminal stenosis bilaterally at L4-5 is worse on the left. Electronically Signed   By: San Morelle M.D.   On:  07/23/2015 19:24   Ct Abdomen Pelvis W Contrast  07/23/2015  CLINICAL DATA:  Abdominal pain with nausea and vomiting for 2 days. EXAM: CT ABDOMEN AND PELVIS WITH CONTRAST TECHNIQUE: Multidetector CT imaging of the abdomen and pelvis was performed using the standard protocol following bolus administration of intravenous contrast. CONTRAST:  124mL ISOVUE-300 IOPAMIDOL (ISOVUE-300) INJECTION 61% COMPARISON:  None. FINDINGS: Lower chest:  Scarring noted posterior medial right lower lobe. Hepatobiliary: No focal abnormality within the liver parenchyma. There is no evidence for gallstones, gallbladder wall thickening, or pericholecystic fluid. No intrahepatic or extrahepatic biliary dilation. Pancreas: No focal mass lesion. No dilatation of the main duct. No intraparenchymal cyst. No peripancreatic edema. Spleen: No splenomegaly. No focal mass lesion. Adrenals/Urinary Tract: No adrenal nodule or mass. Tiny cortical hypo attenuating lesions in the kidneys bilaterally are too small to characterize but likely represent cysts. No evidence for hydroureter. The urinary bladder appears normal for the degree of distention. Stomach/Bowel: Small hiatal hernia. Stomach otherwise unremarkable. Duodenum is normally positioned as is the ligament of Treitz. No small bowel wall thickening. No small bowel dilatation. Terminal ileum is normal. The appendix is not visualized, but there is no edema or inflammation in the region of the cecum. Diverticular changes are noted in the left colon without evidence of diverticulitis. Vascular/Lymphatic: There is abdominal aortic atherosclerosis without aneurysm. There is no gastrohepatic or hepatoduodenal ligament lymphadenopathy. No intraperitoneal or retroperitoneal lymphadenopathy. No pelvic sidewall lymphadenopathy. Reproductive: The uterus is surgically absent. There is no adnexal mass. Other: No intraperitoneal free fluid. Musculoskeletal: Pelvic floor laxity noted. 8 mm sclerotic lesion  left iliac crest presumably a bone island. No other worrisome lytic or sclerotic osseous abnormality. Similar 7 mm sclerotic lesion L3 vertebral body likely a bone island compression fracture noted at T12 is likely acute and results and anterior loss of vertebral body height. IMPRESSION: 1. No acute intraperitoneal abnormality of the abdomen or pelvis. 2. T12 compression fracture appears acute by CT imaging. 3. Small hiatal hernia. 4. Pelvic floor laxity. Imaging findings of potential clinical significance: Abdominal Aortic Atherosclerois (ICD10-170.0) Electronically Signed   By: Misty Stanley M.D.   On: 07/23/2015 19:20   Ct Hip Left Wo Contrast  07/24/2015  CLINICAL DATA:  Left hip pain since last Thursday.  No known injury. EXAM: CT OF THE LEFT HIP WITHOUT CONTRAST TECHNIQUE: Multidetector CT imaging of the left hip was performed according to the standard protocol. Multiplanar CT image reconstructions were also generated. COMPARISON:  Radiographs 07/21/2015 FINDINGS: Contrast is noted in the bladder likely from previous imaging study. The left hip is normally located. There are advanced degenerative changes with joint space narrowing, osteophytic spurring and subchondral cystic change. No fracture or AVN. The visualized left hemipelvis bony structures are intact. There are degenerative changes noted at the pubic symphysis. No pubic rami fractures. The left hip and pelvic  musculature appear normal. No obvious mass, hematoma or edema. IMPRESSION: Advanced left hip joint degenerative changes but no acute bony findings or evidence of AVN. If symptoms persist MR may be helpful for further evaluation, particularly for a subtle stress fracture or soft tissue abnormality. Electronically Signed   By: Marijo Sanes M.D.   On: 07/24/2015 11:20     CBC  Recent Labs Lab 07/29/15 1410 07/30/15 0553 07/31/15 0700  WBC 13.0* 10.7* 10.9*  HGB 13.8 12.5 13.3  HCT 40.4 37.9 40.3  PLT 335 263 305  MCV 88.6 89.0 89.8   MCH 30.3 29.3 29.6  MCHC 34.2 33.0 33.0  RDW 13.7 13.8 14.1    Chemistries   Recent Labs Lab 07/29/15 1410 07/29/15 1922 07/30/15 0553 07/31/15 0700  NA 130* 129* 130* 133*  K 3.6 3.6 2.9* 4.1  CL 92* 94* 95* 104  CO2 28 29 26 24   GLUCOSE 133* 125* 119* 111*  BUN 13 13 9 18   CREATININE 1.04* 0.86 0.74 0.80  CALCIUM 9.9 9.2 9.1 9.6  MG  --  1.6*  --   --   AST  --   --   --  15  ALT  --   --   --  11*  ALKPHOS  --   --   --  88  BILITOT  --   --   --  0.4   ------------------------------------------------------------------------------------------------------------------ estimated creatinine clearance is 40.2 mL/min (by C-G formula based on Cr of 0.8). ------------------------------------------------------------------------------------------------------------------ No results for input(s): HGBA1C in the last 72 hours. ------------------------------------------------------------------------------------------------------------------ No results for input(s): CHOL, HDL, LDLCALC, TRIG, CHOLHDL, LDLDIRECT in the last 72 hours. ------------------------------------------------------------------------------------------------------------------ No results for input(s): TSH, T4TOTAL, T3FREE, THYROIDAB in the last 72 hours.  Invalid input(s): FREET3 ------------------------------------------------------------------------------------------------------------------ No results for input(s): VITAMINB12, FOLATE, FERRITIN, TIBC, IRON, RETICCTPCT in the last 72 hours.  Coagulation profile No results for input(s): INR, PROTIME in the last 168 hours.  No results for input(s): DDIMER in the last 72 hours.  Cardiac Enzymes No results for input(s): CKMB, TROPONINI, MYOGLOBIN in the last 168 hours.  Invalid input(s): CK ------------------------------------------------------------------------------------------------------------------ Invalid input(s): POCBNP   CBG: No results for input(s):  GLUCAP in the last 168 hours.     Studies: Dg Chest 2 View  07/29/2015  CLINICAL DATA:  Altered mental status 1 day. EXAM: CHEST  2 VIEW COMPARISON:  06/04/2011 and abdominal pelvic CT 07/23/2015 FINDINGS: Lungs are adequately inflated without consolidation or effusion. Borderline cardiomegaly unchanged. There are degenerative changes of the spine. There is a moderate compression fracture of T12 new since 2013 and unchanged from the recent CT. Mild loss of height of 2 adjacent mid thoracic vertebral bodies these indeterminate but new since 2013. IMPRESSION: No acute cardiopulmonary disease. Moderate T12 compression fracture unchanged from the recent CT and new since 2013. Mild vertebral body height loss of 2 adjacent mid thoracic vertebral bodies age indeterminate. Electronically Signed   By: Marin Olp M.D.   On: 07/29/2015 16:15   Ct Head Wo Contrast  07/29/2015  CLINICAL DATA:  Confusion as well as diarrhea.  Hyponatremia. EXAM: CT HEAD WITHOUT CONTRAST TECHNIQUE: Contiguous axial images were obtained from the base of the skull through the vertex without intravenous contrast. COMPARISON:  None. FINDINGS: The ventricles, cisterns and other CSF spaces are within normal. There is minimal chronic ischemic microvascular disease. There is no mass, mass effect, shift of midline structures or acute hemorrhage. No evidence of acute infarction. Remaining bones and soft tissues are within  normal. IMPRESSION: No acute intracranial findings. Subtle chronic ischemic microvascular disease. Electronically Signed   By: Marin Olp M.D.   On: 07/29/2015 15:55      No results found for: HGBA1C Lab Results  Component Value Date   CREATININE 0.80 07/31/2015       Scheduled Meds: . atenolol  50 mg Oral BID  . calcitonin (salmon)  1 spray Alternating Nares Daily  . cefTRIAXone (ROCEPHIN)  IV  1 g Intravenous Q24H  . DULoxetine  60 mg Oral Daily  . enoxaparin (LOVENOX) injection  40 mg Subcutaneous Q24H   . famotidine (PEPCID) IV  20 mg Intravenous Q24H  . ketorolac  15 mg Intravenous Q12H  . lidocaine  1 patch Transdermal Q24H  . losartan  100 mg Oral Daily  . nystatin  5 mL Oral QID  . potassium chloride  40 mEq Oral TID  . traZODone  75 mg Oral QHS   Continuous Infusions: . sodium chloride 75 mL/hr at 07/31/15 0107     LOS: 2 days    Time spent: >30 MINS    [facility]  [facility] Pager [phone]. If 7PM-7AM, please contact night-coverage at [email or web address], password [email or web address] 07/31/2015, 8:28 AM  LOS: 2 days

## 2015-07-31 NOTE — NC FL2 (Signed)
[place] LEVEL OF CARE SCREENING TOOL     IDENTIFICATION  Patient Name: [name] Birthdate: 06/30/1921 Sex: female Admission Date (Current Location): 07/29/2015  [facility] and [place] Number:  Herbalist and Address:  The [place], [place] [address] [place], [place] [address]      Provider Number: [id number]  Attending Physician Name and Address:  Reyne Dumas, MD  Relative Name and Phone Number:  [name] - daughter.  Phone number [phone]    Current Level of Care: Hospital Recommended Level of Care: [place] Prior Approval Number:    Date Approved/Denied:   PASRR Number: [id number]   Discharge Plan: SNF    Current Diagnoses: Patient Active Problem List   Diagnosis Date Noted  . Complex care coordination   . Palliative care by specialist   . Advance care planning   . Thrush of mouth and esophagus ([place])   . Fecal incontinence   . Acute encephalopathy 07/29/2015  . Chronic diarrhea 07/29/2015  . Chronic hyponatremia 07/23/2015  . Hypokalemia 07/23/2015  . Hypomagnesemia 07/23/2015  . Acute UTI 07/23/2015  . Depression 07/23/2015  . Abdominal pain 07/23/2015  . Back pain 07/23/2015  . Compression fracture of thoracic vertebra (HCC) 07/23/2015  . Insomnia 08/26/2011  . Endometrial ca ([place]) 05/12/2011  . Left knee pain 04/07/2011  . Calf pain 04/07/2011  . Essential hypertension 01/15/2010  . ARTHRITIS 01/15/2010  . DIVERTICULITIS, HX OF 01/15/2010    Orientation RESPIRATION BLADDER Height & Weight     Self, Time  Normal Continent Weight: 140 lb (63.504 kg) Height:  5\' 3"  (160 cm)  BEHAVIORAL SYMPTOMS/MOOD NEUROLOGICAL BOWEL NUTRITION STATUS      Incontinent Diet (Regular)  AMBULATORY STATUS COMMUNICATION OF NEEDS Skin   Extensive Assist (Patient unable to ambulate during evaluation due to pain) Verbally Other (Comment) (Ecchymosis mid/left back)                       Personal  Care Assistance Level of Assistance  Bathing, Feeding, Dressing Bathing Assistance: Maximum assistance Feeding assistance: Independent (help with set-up) Dressing Assistance: Maximum assistance     Functional Limitations Info  Sight, Hearing, Speech Sight Info: Adequate Hearing Info: Adequate Speech Info: Adequate    SPECIAL CARE FACTORS FREQUENCY  PT (By licensed PT), OT (By licensed OT), Speech therapy     PT Frequency: Evaluated 7/20 and a minimum of 3X per week therapy recommended OT Frequency: Evaluated 7/20     Speech Therapy Frequency: Evaluated 7/19      Contractures Contractures Info: Not present    Additional Factors Info  Code Status Code Status Info: DNR Allergies Info: Ace Inhibitors and Lactose Intolerance           Current Medications (07/31/2015):  This is the current hospital active medication list Current Facility-Administered Medications  Medication Dose Route Frequency Provider Last Rate Last Dose  . 0.9 %  sodium chloride infusion   Intravenous Continuous Samella Parr, NP 75 mL/hr at 07/31/15 1100    . acetaminophen (TYLENOL) tablet 650 mg  650 mg Oral Q6H PRN Samella Parr, NP   650 mg at 07/31/15 1251   Or  . acetaminophen (TYLENOL) suppository 650 mg  650 mg Rectal Q6H PRN Samella Parr, NP      . atenolol (TENORMIN) tablet 50 mg  50 mg Oral BID Reyne Dumas, MD   50 mg at 07/31/15 0913  . calcitonin (  salmon) (MIACALCIN/FORTICAL) nasal spray 1 spray  1 spray Alternating Nares Daily Samella Parr, NP   1 spray at 07/31/15 0914  . cefTRIAXone (ROCEPHIN) 1 g in dextrose 5 % 50 mL IVPB  1 g Intravenous Q24H Samella Parr, NP 100 mL/hr at 07/30/15 1651 1 g at 07/30/15 1651  . cyclobenzaprine (FLEXERIL) tablet 5 mg  5 mg Oral TID Reyne Dumas, MD   5 mg at 07/31/15 1055  . DULoxetine (CYMBALTA) DR capsule 60 mg  60 mg Oral Daily Samella Parr, NP   60 mg at 07/31/15 0913  . enoxaparin (LOVENOX) injection 40 mg  40 mg Subcutaneous Q24H  Samella Parr, NP   40 mg at 07/30/15 2102  . [START ON 08/01/2015] famotidine (PEPCID) tablet 20 mg  20 mg Oral Daily Reyne Dumas, MD      . hydrALAZINE (APRESOLINE) injection 10 mg  10 mg Intravenous Q4H PRN Reyne Dumas, MD   10 mg at 07/31/15 1251  . lidocaine (LIDODERM) 5 % 1 patch  1 patch Transdermal Q24H Samella Parr, NP   1 patch at 07/30/15 1616  . losartan (COZAAR) tablet 100 mg  100 mg Oral Daily Samella Parr, NP   100 mg at 07/31/15 0913  . nystatin (MYCOSTATIN) 100000 UNIT/ML suspension 500,000 Units  5 mL Oral QID Earlie Counts, NP   500,000 Units at 07/31/15 1448  . polyvinyl alcohol (LIQUIFILM TEARS) 1.4 % ophthalmic solution 1 drop  1 drop Both Eyes PRN Samella Parr, NP      . potassium chloride SA (K-DUR,KLOR-CON) CR tablet 40 mEq  40 mEq Oral TID Reyne Dumas, MD   40 mEq at 07/31/15 0913  . traMADol (ULTRAM) tablet 50 mg  50 mg Oral Q6H PRN Reyne Dumas, MD      . traMADol (ULTRAM) tablet 50 mg  50 mg Oral Q12H Reyne Dumas, MD   50 mg at 07/31/15 1055  . traZODone (DESYREL) tablet 75 mg  75 mg Oral QHS Samella Parr, NP   75 mg at 07/30/15 2103     Discharge Medications: Please see discharge summary for a list of discharge medications.  Relevant Imaging Results:  Relevant Lab Results:   Additional Information ss#[id number]  Sable Feil, LCSW

## 2015-08-01 DIAGNOSIS — E871 Hypo-osmolality and hyponatremia: Secondary | ICD-10-CM | POA: Diagnosis not present

## 2015-08-01 DIAGNOSIS — G9349 Other encephalopathy: Secondary | ICD-10-CM | POA: Diagnosis not present

## 2015-08-01 DIAGNOSIS — Z0000 Encounter for general adult medical examination without abnormal findings: Secondary | ICD-10-CM | POA: Diagnosis not present

## 2015-08-01 DIAGNOSIS — I10 Essential (primary) hypertension: Secondary | ICD-10-CM | POA: Diagnosis not present

## 2015-08-01 DIAGNOSIS — K529 Noninfective gastroenteritis and colitis, unspecified: Secondary | ICD-10-CM | POA: Diagnosis not present

## 2015-08-01 DIAGNOSIS — S22020S Wedge compression fracture of second thoracic vertebra, sequela: Secondary | ICD-10-CM | POA: Diagnosis not present

## 2015-08-01 DIAGNOSIS — G9340 Encephalopathy, unspecified: Secondary | ICD-10-CM | POA: Diagnosis not present

## 2015-08-01 DIAGNOSIS — N390 Urinary tract infection, site not specified: Secondary | ICD-10-CM | POA: Diagnosis not present

## 2015-08-01 DIAGNOSIS — M170 Bilateral primary osteoarthritis of knee: Secondary | ICD-10-CM | POA: Diagnosis not present

## 2015-08-01 DIAGNOSIS — S22000D Wedge compression fracture of unspecified thoracic vertebra, subsequent encounter for fracture with routine healing: Secondary | ICD-10-CM | POA: Diagnosis not present

## 2015-08-01 DIAGNOSIS — R197 Diarrhea, unspecified: Secondary | ICD-10-CM | POA: Diagnosis not present

## 2015-08-01 DIAGNOSIS — R41841 Cognitive communication deficit: Secondary | ICD-10-CM | POA: Diagnosis not present

## 2015-08-01 DIAGNOSIS — R627 Adult failure to thrive: Secondary | ICD-10-CM | POA: Diagnosis not present

## 2015-08-01 DIAGNOSIS — R278 Other lack of coordination: Secondary | ICD-10-CM | POA: Diagnosis not present

## 2015-08-01 DIAGNOSIS — R262 Difficulty in walking, not elsewhere classified: Secondary | ICD-10-CM | POA: Diagnosis not present

## 2015-08-01 DIAGNOSIS — E876 Hypokalemia: Secondary | ICD-10-CM | POA: Diagnosis not present

## 2015-08-01 DIAGNOSIS — R410 Disorientation, unspecified: Secondary | ICD-10-CM | POA: Diagnosis not present

## 2015-08-01 DIAGNOSIS — M899 Disorder of bone, unspecified: Secondary | ICD-10-CM | POA: Diagnosis not present

## 2015-08-01 DIAGNOSIS — F331 Major depressive disorder, recurrent, moderate: Secondary | ICD-10-CM | POA: Diagnosis not present

## 2015-08-01 DIAGNOSIS — M6281 Muscle weakness (generalized): Secondary | ICD-10-CM | POA: Diagnosis not present

## 2015-08-01 LAB — URINE CULTURE: Culture: >=100000 — AB

## 2015-08-01 LAB — CBC
HEMATOCRIT: 38.9 % (ref 36.0–46.0)
HEMOGLOBIN: 12.6 g/dL (ref 12.0–15.0)
MCH: 29.4 pg (ref 26.0–34.0)
MCHC: 32.4 g/dL (ref 30.0–36.0)
MCV: 90.7 fL (ref 78.0–100.0)
Platelets: 319 10*3/uL (ref 150–400)
RBC: 4.29 MIL/uL (ref 3.87–5.11)
RDW: 14.2 % (ref 11.5–15.5)
WBC: 13.4 10*3/uL — ABNORMAL HIGH (ref 4.0–10.5)

## 2015-08-01 LAB — COMPREHENSIVE METABOLIC PANEL
ALK PHOS: 86 U/L (ref 38–126)
ALT: 12 U/L — AB (ref 14–54)
AST: 13 U/L — ABNORMAL LOW (ref 15–41)
Albumin: 3.3 g/dL — ABNORMAL LOW (ref 3.5–5.0)
Anion gap: 5 (ref 5–15)
BILIRUBIN TOTAL: 0.4 mg/dL (ref 0.3–1.2)
BUN: 12 mg/dL (ref 6–20)
CALCIUM: 9.3 mg/dL (ref 8.9–10.3)
CO2: 21 mmol/L — AB (ref 22–32)
CREATININE: 0.74 mg/dL (ref 0.44–1.00)
Chloride: 106 mmol/L (ref 101–111)
GFR calc non Af Amer: >60 mL/min (ref >60)
Glucose, Bld: 123 mg/dL — ABNORMAL HIGH (ref 65–99)
Potassium: 4.8 mmol/L (ref 3.5–5.1)
SODIUM: 132 mmol/L — AB (ref 135–145)
Total Protein: 5.8 g/dL — ABNORMAL LOW (ref 6.5–8.1)

## 2015-08-01 MED ORDER — CEFUROXIME AXETIL 250 MG PO TABS: 250.0000 mg | ORAL_TABLET | Freq: Two times a day (BID) | ORAL | Status: DC

## 2015-08-01 MED ORDER — TRAMADOL HCL 50 MG PO TABS
50.0000 mg | ORAL_TABLET | Freq: Four times a day (QID) | ORAL | Status: AC | PRN
Start: 2015-08-01 — End: ?

## 2015-08-01 MED ORDER — POTASSIUM CHLORIDE CRYS ER 20 MEQ PO TBCR
40.0000 meq | EXTENDED_RELEASE_TABLET | Freq: Every day | ORAL | Status: AC
Start: 2015-08-01 — End: ?

## 2015-08-01 MED ORDER — LIDOCAINE 5 % EX PTCH: 1.0000 | MEDICATED_PATCH | CUTANEOUS | Status: AC

## 2015-08-01 MED ORDER — ATENOLOL 50 MG PO TABS: 50.0000 mg | ORAL_TABLET | Freq: Two times a day (BID) | ORAL | Status: AC

## 2015-08-01 MED ORDER — CYCLOBENZAPRINE HCL 5 MG PO TABS: 5.0000 mg | ORAL_TABLET | Freq: Three times a day (TID) | ORAL | Status: AC

## 2015-08-01 MED ORDER — FAMOTIDINE 20 MG PO TABS
20.0000 mg | ORAL_TABLET | Freq: Every day | ORAL | Status: AC
Start: 2015-08-01 — End: ?

## 2015-08-01 MED ORDER — CEFUROXIME AXETIL 250 MG PO TABS
250.0000 mg | ORAL_TABLET | Freq: Two times a day (BID) | ORAL | Status: AC
Start: 2015-08-01 — End: ?

## 2015-08-01 NOTE — Clinical Social Work Note (Addendum)
Clinical Social Work Assessment  Patient Details  Name: [name] MRN: [id number] Date of Birth: [birth year]  Date of referral:  07/30/15               Reason for consult:  Facility Placement                Permission sought to share information with:  Other Permission granted to share information::  No (Patient oriented only to person and time. Daughter [facility]) was at the bedside)  Name::     [name]  Agency::     Relationship::  Daughter  Contact Information:  [phone]  Housing/Transportation Living arrangements for the past 2 months:  [place] of Information:  Adult Children (Daughter [name]) Patient Interpreter Needed:  None Criminal Activity/Legal Involvement Pertinent to Current Situation/Hospitalization:  No - Comment as needed Significant Relationships:  Adult Children Lives with:  Adult Children Do you feel safe going back to the place where you live?  No (Daughter expressed that she can no longer care for patient and is seeking LTC placement.) Need for family participation in patient care:  Yes (Comment)  Care giving concerns:  Daughter reported on 07/30/15 that she has been caring for her mother, but can no longer do so and is seeking LTC placement. Ms. [name] indicated that patient has LTC insurance and this will be used for her facility placement costs.   Social Worker assessment / plan:  CSW talked with patient's daughter [name] at the bedside on 07/30/15 regarding discharge plans for patient.  Daughter reported that she is seeking LTC placement at a skilled facility for her mother as she can no longer provide the care she needs at home. Ms. [name] explained that patient had been living with her and she had [facility] to assist with her care. Per daughter, patient was in good health until 2 weeks ago and then began having lots of pain. She was taken to [facility], was admitted and then discharged home, but since that  time patient's cognition has declined and caring for her has become increasingly difficult. Patient has long-term care insurance per daughter, to pay for the facility costs. She indicated that she has used long-term care insurance before for her father who was in a facility in [place] for 8 years.  CSW talked with daughter regarding facility search process and provided her with a list of skilled facilities.    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  Patient/Family's Understanding of and Emotional Response to Diagnosis, Current Treatment, and Prognosis:  Daughter expressed concern while talking about her mother's cognition and her physical health.  Emotional Assessment Appearance:  Appears stated age Attitude/Demeanor/Rapport:  Lethargic, Other (Patient was confused) Affect (typically observed):  Other (Lethargic ) Orientation:  Oriented to Self, Oriented to  Time Alcohol / Substance use:  Never Used Psych involvement (Current and /or in the community):     Discharge Needs  Concerns to be addressed:  Discharge Planning Concerns Readmission within the last 30 days:  Yes Current discharge risk:  None Barriers to Discharge:  No Barriers Identified   Sable Feil, LCSW 08/01/2015, 12:28 PM

## 2015-08-01 NOTE — Discharge Summary (Signed)
Physician Discharge Summary  [name] MRN: [id number] DOB/AGE: 1921-03-30 82 y.o.  PCP: Rachell Cipro, MD   Admit date: 07/29/2015 Discharge date: 08/01/2015  Discharge Diagnoses:     Principal Problem:   Acute encephalopathy Active Problems:   Essential hypertension   Chronic hyponatremia   Acute UTI   Depression   Compression fracture of thoracic vertebra (HCC)   Chronic diarrhea   Complex care coordination   Palliative care by specialist   Advance care planning   Thrush of mouth and esophagus ([place])   Fecal incontinence    Follow-up recommendations Follow-up with PCP in 3-5 days , including all  additional recommended appointments as below Follow-up CBC, CMP in 3-5 days  Prognosis:  < 12 months d/t decline in patient's functional status r/t hip fracture in advanced age, disposition to skilled nursing facility, decreased cognitive status, and decreased po intake  Discharge Planning:  [place] with Palliative Care for symptom management- pain; probably transition to Hospice within a few months     Current Discharge Medication List    START taking these medications   Details  cefUROXime (CEFTIN) 250 MG tablet Take 1 tablet (250 mg total) by mouth 2 (two) times daily with a meal. Qty: 14 tablet, Refills: 0    cyclobenzaprine (FLEXERIL) 5 MG tablet Take 1 tablet (5 mg total) by mouth 3 (three) times daily. Qty: 30 tablet, Refills: 0    famotidine (PEPCID) 20 MG tablet Take 1 tablet (20 mg total) by mouth daily. Qty: 30 tablet, Refills: 0    lidocaine (LIDODERM) 5 % Place 1 patch onto the skin daily. Remove & Discard patch within 12 hours or as directed by MD Qty: 30 patch, Refills: 0    potassium chloride SA (K-DUR,KLOR-CON) 20 MEQ tablet Take 2 tablets (40 mEq total) by mouth daily. Qty: 30 tablet, Refills: 0    traMADol (ULTRAM) 50 MG tablet Take 1 tablet (50 mg total) by mouth every 6 (six) hours as needed for moderate pain. Qty: 60  tablet, Refills: 0      CONTINUE these medications which have CHANGED   Details  atenolol (TENORMIN) 50 MG tablet Take 1 tablet (50 mg total) by mouth 2 (two) times daily. Qty: 60 tablet, Refills: 1      CONTINUE these medications which have NOT CHANGED   Details  aspirin 81 MG chewable tablet Chew 81 mg by mouth every morning.     calcitonin, salmon, (MIACALCIN/FORTICAL) 200 UNIT/ACT nasal spray Place 1 spray into alternate nostrils daily.  Refills: 11    carboxymethylcellulose (REFRESH TEARS) 0.5 % SOLN Apply 1 drop to eye daily as needed (dry eyes).     DULoxetine (CYMBALTA) 60 MG capsule Take 60 mg by mouth daily.     losartan (COZAAR) 50 MG tablet Take 100 mg by mouth daily.     Probiotic Product (PROBIOTIC PO) Take 1 capsule by mouth daily.    traZODone (DESYREL) 50 MG tablet Take 1 tablet (50 mg total) by mouth 2 (two) times daily. Qty: 180 tablet, Refills: 0   Associated Diagnoses: Insomnia    VOLTAREN 1 % GEL Apply 4 g topically 4 (four) times daily.       STOP taking these medications     HYDROcodone-acetaminophen (NORCO/VICODIN) 5-325 MG tablet          Discharge Condition: *Stable Discharge Instructions Get Medicines reviewed and adjusted: Please take all your medications with you for your next visit with your Primary MD  Please request your  Primary MD to go over all hospital tests and procedure/radiological results at the follow up, please ask your Primary MD to get all Hospital records sent to his/her office.  If you experience worsening of your admission symptoms, develop shortness of breath, life threatening emergency, suicidal or homicidal thoughts you must seek medical attention immediately by calling 911 or calling your MD immediately if symptoms less severe.  You must read complete instructions/literature along with all the possible adverse reactions/side effects for all the Medicines you take and that have been prescribed to you. Take any new  Medicines after you have completely understood and accpet all the possible adverse reactions/side effects.   Do not drive when taking Pain medications.   Do not take more than prescribed Pain, Sleep and Anxiety Medications  Special Instructions: If you have smoked or chewed Tobacco in the last 2 yrs please stop smoking, stop any regular Alcohol and or any Recreational drug use.  Wear Seat belts while driving.  Please note  You were cared for by a hospitalist during your hospital stay. Once you are discharged, your primary care physician will handle any further medical issues. Please note that NO REFILLS for any discharge medications will be authorized once you are discharged, as it is imperative that you return to your primary care physician (or establish a relationship with a primary care physician if you do not have one) for your aftercare needs so that they can reassess your need for medications and monitor your lab values.  Discharge Instructions    Diet - low sodium heart healthy    Complete by:  As directed      Increase activity slowly    Complete by:  As directed             Allergies  Allergen Reactions  . Ace Inhibitors Cough  . Lactose Intolerance (Gi)       Disposition: SNF   Consults:  Palliative care   Significant Diagnostic Studies:  Dg Chest 2 View  07/29/2015  CLINICAL DATA:  Altered mental status 1 day. EXAM: CHEST  2 VIEW COMPARISON:  06/04/2011 and abdominal pelvic CT 07/23/2015 FINDINGS: Lungs are adequately inflated without consolidation or effusion. Borderline cardiomegaly unchanged. There are degenerative changes of the spine. There is a moderate compression fracture of T12 new since 2013 and unchanged from the recent CT. Mild loss of height of 2 adjacent mid thoracic vertebral bodies these indeterminate but new since 2013. IMPRESSION: No acute cardiopulmonary disease. Moderate T12 compression fracture unchanged from the recent CT and new since 2013.  Mild vertebral body height loss of 2 adjacent mid thoracic vertebral bodies age indeterminate. Electronically Signed   By: Marin Olp M.D.   On: 07/29/2015 16:15   Dg Lumbar Spine Complete  07/21/2015  CLINICAL DATA:  Low back pain for the past 5 days radiating into the left hip without known injury EXAM: LUMBAR SPINE - COMPLETE 4+ VIEW COMPARISON:  None in PACs FINDINGS: There is moderate dextrocurvature of the lower thoracic and lumbar spine centered at L1-2. The pedicles and transverse processes of the lumbar spine are intact. There is no compression fracture or spondylolisthesis. There is moderate to severe degenerative narrowing of the L3-4 and L4-5 disc spaces with mild to moderate narrowing at L1-2 and L2-3. The L5-S1 disc space is normal. There is facet joint hypertrophy from L3-S1. IMPRESSION: Multilevel moderate to severe degenerative disc and facet joint disease as described. No compression fracture. Mild levocurvature of the thoracolumbar spine. Move  Electronically Signed   By: David  Martinique M.D.   On: 07/21/2015 14:46   Dg Pelvis 1-2 Views  07/21/2015  CLINICAL DATA:  Five days of low back pain radiating into the left hip without known injury EXAM: PELVIS - 1-2 VIEW COMPARISON:  None in PACs FINDINGS: The bony pelvis is subjectively osteopenic. There is no lytic or blastic lesion or acute fracture. There is mild narrowing of the hip joint spaces bilaterally slightly greater on the right than on the left. The femoral necks, intertrochanteric, and subtrochanteric regions are grossly normal. The bowel gas pattern is unremarkable. IMPRESSION: There is mild to moderate osteoarthritic change of both hips. There is no acute bony abnormality of the pelvis. Electronically Signed   By: David  Martinique M.D.   On: 07/21/2015 14:47   Ct Head Wo Contrast  07/29/2015  CLINICAL DATA:  Confusion as well as diarrhea.  Hyponatremia. EXAM: CT HEAD WITHOUT CONTRAST TECHNIQUE: Contiguous axial images were obtained  from the base of the skull through the vertex without intravenous contrast. COMPARISON:  None. FINDINGS: The ventricles, cisterns and other CSF spaces are within normal. There is minimal chronic ischemic microvascular disease. There is no mass, mass effect, shift of midline structures or acute hemorrhage. No evidence of acute infarction. Remaining bones and soft tissues are within normal. IMPRESSION: No acute intracranial findings. Subtle chronic ischemic microvascular disease. Electronically Signed   By: Marin Olp M.D.   On: 07/29/2015 15:55   Ct Lumbar Spine Wo Contrast  07/23/2015  CLINICAL DATA:  Abdominal pain. Low back pain with nausea and vomiting for 2 days. EXAM: CT LUMBAR SPINE WITHOUT CONTRAST TECHNIQUE: Multidetector CT imaging of the lumbar spine was performed without intravenous contrast administration. Multiplanar CT image reconstructions were also generated. COMPARISON:  Lumbar spine radiographs 07/21/2015. Two-view chest x-ray 06/04/2011. FINDINGS: The lumbar spine is imaged from the midbody of T11 through S2. Acute superior endplate fractures present at T12 without significant retropulsion of bone. Paraspinal hemorrhage is noted without displacement of the aorta. Atherosclerotic calcifications are present in the aorta without aneurysm. There is no significant adenopathy or other focal lesion. Rightward curvature of the thoracolumbar spine is centered at T12-L1. There is leftward curvature at L4-5. Chronic endplate changes are present on the right at L3-4 and L4-5. There chronic endplate changes on the left at T12-L1. Bone island is present at L3. T12-L1: A leftward disc protrusion is present. Left foraminal stenosis is present. L1-2: A broad-based disc protrusion is asymmetric to the left. Left foraminal narrowing is present. L2-3: A broad-based disc protrusion is present. Moderate facet hypertrophy is noted bilaterally. Mild left foraminal narrowing is present. L3-4: There is uncovering of  a broad-based disc protrusion. Advanced facet hypertrophy and spurring is noted this leads to moderate to severe central and bilateral foraminal stenosis, left greater than right. L4-5: A broad-based disc protrusion is present. Advanced facet hypertrophy is noted. Spurring leads to moderate foraminal stenosis bilaterally, left greater than right. L5-S1: A shallow central disc protrusion is present. Moderate facet hypertrophy is noted without significant stenosis. IMPRESSION: 1. Superior endplate compression fracture at T12 with 40% loss of height but no retropulsion of bone. 2. Rightward curvature of the lumbar spine centered at T12-L1. 3. Leftward curvature centered at L4. 4. Left foraminal stenosis at T12-L1, L1-2, and L2-3. 5. Moderate to severe central and bilateral foraminal narrowing at L3-4, left greater than right. 6. Moderate foraminal stenosis bilaterally at L4-5 is worse on the left. Electronically Signed   By: Harrell Gave  Mattern M.D.   On: 07/23/2015 19:24   Ct Abdomen Pelvis W Contrast  07/23/2015  CLINICAL DATA:  Abdominal pain with nausea and vomiting for 2 days. EXAM: CT ABDOMEN AND PELVIS WITH CONTRAST TECHNIQUE: Multidetector CT imaging of the abdomen and pelvis was performed using the standard protocol following bolus administration of intravenous contrast. CONTRAST:  167m ISOVUE-300 IOPAMIDOL (ISOVUE-300) INJECTION 61% COMPARISON:  None. FINDINGS: Lower chest:  Scarring noted posterior medial right lower lobe. Hepatobiliary: No focal abnormality within the liver parenchyma. There is no evidence for gallstones, gallbladder wall thickening, or pericholecystic fluid. No intrahepatic or extrahepatic biliary dilation. Pancreas: No focal mass lesion. No dilatation of the main duct. No intraparenchymal cyst. No peripancreatic edema. Spleen: No splenomegaly. No focal mass lesion. Adrenals/Urinary Tract: No adrenal nodule or mass. Tiny cortical hypo attenuating lesions in the kidneys bilaterally are  too small to characterize but likely represent cysts. No evidence for hydroureter. The urinary bladder appears normal for the degree of distention. Stomach/Bowel: Small hiatal hernia. Stomach otherwise unremarkable. Duodenum is normally positioned as is the ligament of Treitz. No small bowel wall thickening. No small bowel dilatation. Terminal ileum is normal. The appendix is not visualized, but there is no edema or inflammation in the region of the cecum. Diverticular changes are noted in the left colon without evidence of diverticulitis. Vascular/Lymphatic: There is abdominal aortic atherosclerosis without aneurysm. There is no gastrohepatic or hepatoduodenal ligament lymphadenopathy. No intraperitoneal or retroperitoneal lymphadenopathy. No pelvic sidewall lymphadenopathy. Reproductive: The uterus is surgically absent. There is no adnexal mass. Other: No intraperitoneal free fluid. Musculoskeletal: Pelvic floor laxity noted. 8 mm sclerotic lesion left iliac crest presumably a bone island. No other worrisome lytic or sclerotic osseous abnormality. Similar 7 mm sclerotic lesion L3 vertebral body likely a bone island compression fracture noted at T12 is likely acute and results and anterior loss of vertebral body height. IMPRESSION: 1. No acute intraperitoneal abnormality of the abdomen or pelvis. 2. T12 compression fracture appears acute by CT imaging. 3. Small hiatal hernia. 4. Pelvic floor laxity. Imaging findings of potential clinical significance: Abdominal Aortic Atherosclerois (ICD10-170.0) Electronically Signed   By: EMisty StanleyM.D.   On: 07/23/2015 19:20   Ct Hip Left Wo Contrast  07/24/2015  CLINICAL DATA:  Left hip pain since last Thursday.  No known injury. EXAM: CT OF THE LEFT HIP WITHOUT CONTRAST TECHNIQUE: Multidetector CT imaging of the left hip was performed according to the standard protocol. Multiplanar CT image reconstructions were also generated. COMPARISON:  Radiographs 07/21/2015  FINDINGS: Contrast is noted in the bladder likely from previous imaging study. The left hip is normally located. There are advanced degenerative changes with joint space narrowing, osteophytic spurring and subchondral cystic change. No fracture or AVN. The visualized left hemipelvis bony structures are intact. There are degenerative changes noted at the pubic symphysis. No pubic rami fractures. The left hip and pelvic musculature appear normal. No obvious mass, hematoma or edema. IMPRESSION: Advanced left hip joint degenerative changes but no acute bony findings or evidence of AVN. If symptoms persist MR may be helpful for further evaluation, particularly for a subtle stress fracture or soft tissue abnormality. Electronically Signed   By: PMarijo SanesM.D.   On: 07/24/2015 11:20       Filed Weights   07/29/15 1401 07/30/15 2101  Weight: 65.772 kg (145 lb) 63.504 kg (140 lb)     Microbiology: Recent Results (from the past 240 hour(s))  Gastrointestinal Panel by PCR , Stool  Status: None   Collection Time: 07/24/15  5:05 AM  Result Value Ref Range Status   Campylobacter species NOT DETECTED NOT DETECTED Final   Plesimonas shigelloides NOT DETECTED NOT DETECTED Final   Salmonella species NOT DETECTED NOT DETECTED Final   Yersinia enterocolitica NOT DETECTED NOT DETECTED Final   Vibrio species NOT DETECTED NOT DETECTED Final   Vibrio cholerae NOT DETECTED NOT DETECTED Final   Enteroaggregative E coli (EAEC) NOT DETECTED NOT DETECTED Final   Enteropathogenic E coli (EPEC) NOT DETECTED NOT DETECTED Final   Enterotoxigenic E coli (ETEC) NOT DETECTED NOT DETECTED Final   Shiga like toxin producing E coli (STEC) NOT DETECTED NOT DETECTED Final   E. coli O157 NOT DETECTED NOT DETECTED Final   Shigella/Enteroinvasive E coli (EIEC) NOT DETECTED NOT DETECTED Final   Cryptosporidium NOT DETECTED NOT DETECTED Final   Cyclospora cayetanensis NOT DETECTED NOT DETECTED Final   Entamoeba histolytica  NOT DETECTED NOT DETECTED Final   Giardia lamblia NOT DETECTED NOT DETECTED Final   Adenovirus F40/41 NOT DETECTED NOT DETECTED Final   Astrovirus NOT DETECTED NOT DETECTED Final   Norovirus GI/GII NOT DETECTED NOT DETECTED Final   Rotavirus A NOT DETECTED NOT DETECTED Final   Sapovirus (I, II, IV, and V) NOT DETECTED NOT DETECTED Final  Urine culture     Status: Abnormal   Collection Time: 07/29/15  4:35 PM  Result Value Ref Range Status   Specimen Description URINE, CLEAN CATCH  Final   Special Requests NONE  Final   Culture >=100,000 COLONIES/mL ENTEROBACTER CLOACAE (A)  Final   Report Status 08/01/2015 FINAL  Final   Organism ID, Bacteria ENTEROBACTER CLOACAE (A)  Final      Susceptibility   Enterobacter cloacae - MIC*    CEFAZOLIN >=64 RESISTANT Resistant     CEFTRIAXONE <=1 SENSITIVE Sensitive     CIPROFLOXACIN <=0.25 SENSITIVE Sensitive     GENTAMICIN <=1 SENSITIVE Sensitive     IMIPENEM <=0.25 SENSITIVE Sensitive     NITROFURANTOIN 64 INTERMEDIATE Intermediate     TRIMETH/SULFA <=20 SENSITIVE Sensitive     PIP/TAZO <=4 SENSITIVE Sensitive     * >=100,000 COLONIES/mL ENTEROBACTER CLOACAE       Blood Culture    Component Value Date/Time   SDES URINE, CLEAN CATCH 07/29/2015 1635   SPECREQUEST NONE 07/29/2015 1635   CULT >=100,000 COLONIES/mL ENTEROBACTER CLOACAE* 07/29/2015 1635   REPTSTATUS 08/01/2015 FINAL 07/29/2015 1635      Labs: Results for orders placed or performed during the hospital encounter of 07/29/15 (from the past 48 hour(s))  CBC     Status: Abnormal   Collection Time: 07/31/15  7:00 AM  Result Value Ref Range   WBC 10.9 (H) 4.0 - 10.5 K/uL   RBC 4.49 3.87 - 5.11 MIL/uL   Hemoglobin 13.3 12.0 - 15.0 g/dL   HCT 40.3 36.0 - 46.0 %   MCV 89.8 78.0 - 100.0 fL   MCH 29.6 26.0 - 34.0 pg   MCHC 33.0 30.0 - 36.0 g/dL   RDW 14.1 11.5 - 15.5 %   Platelets 305 150 - 400 K/uL  Comprehensive metabolic panel     Status: Abnormal   Collection Time:  07/31/15  7:00 AM  Result Value Ref Range   Sodium 133 (L) 135 - 145 mmol/L   Potassium 4.1 3.5 - 5.1 mmol/L   Chloride 104 101 - 111 mmol/L   CO2 24 22 - 32 mmol/L   Glucose, Bld 111 (H) 65 - 99  mg/dL   BUN 18 6 - 20 mg/dL   Creatinine, Ser 0.80 0.44 - 1.00 mg/dL   Calcium 9.6 8.9 - 10.3 mg/dL   Total Protein 6.3 (L) 6.5 - 8.1 g/dL   Albumin 3.5 3.5 - 5.0 g/dL   AST 15 15 - 41 U/L   ALT 11 (L) 14 - 54 U/L   Alkaline Phosphatase 88 38 - 126 U/L   Total Bilirubin 0.4 0.3 - 1.2 mg/dL   GFR calc non Af Amer >60 >60 mL/min   GFR calc Af Amer >60 >60 mL/min    Comment: (NOTE) The eGFR has been calculated using the CKD EPI equation. This calculation has not been validated in all clinical situations. eGFR's persistently <60 mL/min signify possible Chronic Kidney Disease.    Anion gap 5 5 - 15  CBC     Status: Abnormal   Collection Time: 08/01/15  4:27 AM  Result Value Ref Range   WBC 13.4 (H) 4.0 - 10.5 K/uL   RBC 4.29 3.87 - 5.11 MIL/uL   Hemoglobin 12.6 12.0 - 15.0 g/dL   HCT 38.9 36.0 - 46.0 %   MCV 90.7 78.0 - 100.0 fL   MCH 29.4 26.0 - 34.0 pg   MCHC 32.4 30.0 - 36.0 g/dL   RDW 14.2 11.5 - 15.5 %   Platelets 319 150 - 400 K/uL  Comprehensive metabolic panel     Status: Abnormal   Collection Time: 08/01/15  4:27 AM  Result Value Ref Range   Sodium 132 (L) 135 - 145 mmol/L   Potassium 4.8 3.5 - 5.1 mmol/L   Chloride 106 101 - 111 mmol/L   CO2 21 (L) 22 - 32 mmol/L   Glucose, Bld 123 (H) 65 - 99 mg/dL   BUN 12 6 - 20 mg/dL   Creatinine, Ser 0.74 0.44 - 1.00 mg/dL   Calcium 9.3 8.9 - 10.3 mg/dL   Total Protein 5.8 (L) 6.5 - 8.1 g/dL   Albumin 3.3 (L) 3.5 - 5.0 g/dL   AST 13 (L) 15 - 41 U/L   ALT 12 (L) 14 - 54 U/L   Alkaline Phosphatase 86 38 - 126 U/L   Total Bilirubin 0.4 0.3 - 1.2 mg/dL   GFR calc non Af Amer >60 >60 mL/min   GFR calc Af Amer >60 >60 mL/min    Comment: (NOTE) The eGFR has been calculated using the CKD EPI equation. This calculation has not been  validated in all clinical situations. eGFR's persistently <60 mL/min signify possible Chronic Kidney Disease.    Anion gap 5 5 - 15     Lipid Panel  No results found for: CHOL, TRIG, HDL, CHOLHDL, VLDL, LDLCALC, LDLDIRECT   No results found for: HGBA1C   Lab Results  Component Value Date   CREATININE 0.74 08/01/2015     HPI :*  82 y.o. female with medical history significant for hypertension, chronic diarrhea and lactose intolerance, chronic left knee pain, chronic back pain and chronic mild hyponatremia. Patient was recently hospitalized and discharged on 7/13 after an admission for spontaneous T12 compression fracture. She was continued on Vicodin and her calcitonin. She was sent out with home health PT . A TLSO brace had been recommended during the hospitalization but according to the patient's daughter patient never received brace prior to discharge. At time of discharge and after initial arrival to home patient wasn't ambulated with a rolling walker. Unfortunately since discharge patient has a progressive decline in function and primarily stays  in the bed. She's had increasing confusion prompting her daughter to contact her primary care physician on 7/17. Labs drawn demonstrate a persistent hyponatremia despite discontinuation of hydrochlorothiazide during the previous admission. Because of the lab abnormalities and associated confusion the patient was instructed to come to the ER for evaluation. Patient also found to have a urinary tract infection  HOSPITAL COURSE:   Acute toxic encephalopathy  -Suspect multifactorial etiology secondary to acute UTI /hyponatremia/pain medications Improving skilled nursing facility placement   She also requested a palliative care consultation for goals of care-DO NOT RESUSCITATE,    Hypertensive urgency, improving Continue atenolol 50 mg twice a day Continue Cozaar, blood pressure improved   Chronic hyponatremia improving -Sodium  at discharge was 129 with a nadir during that previous admission of 122 -Today sodium is 132 during this is an improvement since previous hydrochlorothiazide discontinued  Enterobacter CLOACE UTI  Patient was started on Rocephin, now being changed to Ceftin 7 days        Compression fracture of thoracic vertebra  -Patient endorses continued pain but did well with passive straight leg raise in bed-PT eval pending -Continue calcitonin nasal spray -Because of acute encephalopathy we are holding Vicodin and other narcotics for now -Continue Tylenol -Scheduled Toradol 15 mg IV every 12 hours with twice a day IV Pepcid following renal function closely -Lidoderm patch to T12/L1 region -PT/OT evaluation pending -Apply TLSO brace, discharged to SNF tomorrow Continue flexeril and tramdol for better pain control     Depression -Patient no longer takes Celexa -Continue Cymbalta as well as trazodone at bedtime   Chronic diarrhea -Patient has undergone multiple workups with out any etiology to diarrhea other than known lactose intolerance -Continue lactose-free diet    Discharge Exam:  Blood pressure 150/53, pulse 69, temperature 98.9 F (37.2 C), temperature source Oral, resp. rate 16, height _0  (1.6 m), weight 63.504 kg (140 lb), SpO2 97 %.  General exam: Appears calm and comfortable  Respiratory system: Clear to auscultation. Respiratory effort normal. Cardiovascular system: S1 & S2 heard, RRR. No JVD, murmurs, rubs, gallops or clicks. No pedal edema. Gastrointestinal system: Abdomen is nondistended, soft and nontender. No organomegaly or masses felt. Normal bowel sounds heard. Central nervous system: Alert and oriented. No focal neurological deficits. Extremities: Symmetric 5 x 5 power. Skin: No rashes, lesions or ulcers Psychiatry: Judgement and insight Impaired. Mood & affect appropriate.     Follow-up Information    Follow up with [facility], [place]. Schedule an  appointment as soon as possible for a visit in 3 days.   Specialty:  Family Medicine   Contact information:   [address] [place] [address] [phone]       Signed: Reyne Dumas 08/01/2015, 10:34 AM        Time spent >45 mins

## 2015-08-01 NOTE — Progress Notes (Signed)
Pt being discharged home via transport to [facility]. Pt alert and oriented x3. VSS. Pt given pain medication prior to discharge. No signs of respiratory distress. Education complete and care plans resolved. IV removed with catheter intact and pt tolerated well. No further issues at this time. Pt to follow up with PCP. Leanne Chang, RN

## 2015-08-01 NOTE — Care Management Important Message (Signed)
Important Message  Patient Details  Name: [name] MRN: [id number] Date of Birth: 02-02-1921   Medicare Important Message Given:  Yes    [name] 08/01/2015, 8:31 AM

## 2015-08-01 NOTE — Clinical Social Work Placement (Signed)
   CLINICAL SOCIAL WORK PLACEMENT  NOTE 08/01/15 - DISCHARGED TO [place]  Date:  08/01/2015  Patient Details  Name: [name] MRN: [id number] Date of Birth: May 10, 1921  Clinical Social Work is seeking post-discharge placement for this patient at the [place] level of care (*CSW will initial, date and re-position this form in  chart as items are completed):  Yes   Patient/family provided with [place] Work Department's list of facilities offering this level of care within the geographic area requested by the patient (or if unable, by the patient's family).  Yes   Patient/family informed of their freedom to choose among providers that offer the needed level of care, that participate in Medicare, Medicaid or managed care program needed by the patient, have an available bed and are willing to accept the patient.  Yes   Patient/family informed of [place]'s ownership interest in [facility] and [facility], as well as of the fact that they are under no obligation to receive care at these facilities.  PASRR submitted to EDS on 07/30/15     PASRR number received on 07/30/15     Existing PASRR number confirmed on       FL2 transmitted to all facilities in geographic area requested by pt/family on 07/31/15     FL2 transmitted to all facilities within larger geographic area on       Patient informed that his/her managed care company has contracts with or will negotiate with certain facilities, including the following:        Yes   Patient/family informed of bed offers received.  Patient chooses bed at [facility]     Physician recommends and patient chooses bed at      Patient to be transferred to [facility] on 08/01/15.  Patient to be transferred to facility by Ambulance     Patient family notified on 07/31/15 of transfer.  Name of family member notified:  Daughter [name]      PHYSICIAN       Additional Comment:    _______________________________________________ Sable Feil, LCSW 08/01/2015, 1:41 PM

## 2015-08-04 DIAGNOSIS — G9340 Encephalopathy, unspecified: Secondary | ICD-10-CM | POA: Diagnosis not present

## 2015-08-04 DIAGNOSIS — N390 Urinary tract infection, site not specified: Secondary | ICD-10-CM | POA: Diagnosis not present

## 2015-08-04 DIAGNOSIS — K529 Noninfective gastroenteritis and colitis, unspecified: Secondary | ICD-10-CM | POA: Diagnosis not present

## 2015-08-04 DIAGNOSIS — S22020S Wedge compression fracture of second thoracic vertebra, sequela: Secondary | ICD-10-CM | POA: Diagnosis not present

## 2015-08-05 DIAGNOSIS — G9349 Other encephalopathy: Secondary | ICD-10-CM | POA: Diagnosis not present

## 2015-08-05 DIAGNOSIS — F331 Major depressive disorder, recurrent, moderate: Secondary | ICD-10-CM | POA: Diagnosis not present

## 2015-08-05 DIAGNOSIS — S22000D Wedge compression fracture of unspecified thoracic vertebra, subsequent encounter for fracture with routine healing: Secondary | ICD-10-CM | POA: Diagnosis not present

## 2015-08-05 DIAGNOSIS — I10 Essential (primary) hypertension: Secondary | ICD-10-CM | POA: Diagnosis not present

## 2015-08-05 DIAGNOSIS — R627 Adult failure to thrive: Secondary | ICD-10-CM | POA: Diagnosis not present

## 2015-08-05 DIAGNOSIS — N390 Urinary tract infection, site not specified: Secondary | ICD-10-CM | POA: Diagnosis not present

## 2015-08-05 DIAGNOSIS — E871 Hypo-osmolality and hyponatremia: Secondary | ICD-10-CM | POA: Diagnosis not present

## 2015-08-28 DIAGNOSIS — M899 Disorder of bone, unspecified: Secondary | ICD-10-CM | POA: Diagnosis not present

## 2015-09-01 DIAGNOSIS — Z0000 Encounter for general adult medical examination without abnormal findings: Secondary | ICD-10-CM | POA: Diagnosis not present

## 2015-09-01 DIAGNOSIS — M170 Bilateral primary osteoarthritis of knee: Secondary | ICD-10-CM | POA: Diagnosis not present

## 2015-09-01 DIAGNOSIS — I10 Essential (primary) hypertension: Secondary | ICD-10-CM | POA: Diagnosis not present

## 2015-09-01 DIAGNOSIS — E871 Hypo-osmolality and hyponatremia: Secondary | ICD-10-CM | POA: Diagnosis not present

## 2015-09-01 DIAGNOSIS — R197 Diarrhea, unspecified: Secondary | ICD-10-CM | POA: Diagnosis not present

## 2015-09-03 DIAGNOSIS — G9349 Other encephalopathy: Secondary | ICD-10-CM | POA: Diagnosis not present

## 2015-09-03 DIAGNOSIS — S22000D Wedge compression fracture of unspecified thoracic vertebra, subsequent encounter for fracture with routine healing: Secondary | ICD-10-CM | POA: Diagnosis not present

## 2015-09-03 DIAGNOSIS — I10 Essential (primary) hypertension: Secondary | ICD-10-CM | POA: Diagnosis not present

## 2015-09-03 DIAGNOSIS — R197 Diarrhea, unspecified: Secondary | ICD-10-CM | POA: Diagnosis not present

## 2015-09-03 DIAGNOSIS — R627 Adult failure to thrive: Secondary | ICD-10-CM | POA: Diagnosis not present

## 2015-09-03 DIAGNOSIS — N390 Urinary tract infection, site not specified: Secondary | ICD-10-CM | POA: Diagnosis not present

## 2015-09-03 DIAGNOSIS — F331 Major depressive disorder, recurrent, moderate: Secondary | ICD-10-CM | POA: Diagnosis not present

## 2015-09-03 DIAGNOSIS — E871 Hypo-osmolality and hyponatremia: Secondary | ICD-10-CM | POA: Diagnosis not present

## 2015-09-08 ENCOUNTER — Other Ambulatory Visit: Payer: Self-pay

## 2015-09-11 DIAGNOSIS — S22020S Wedge compression fracture of second thoracic vertebra, sequela: Secondary | ICD-10-CM | POA: Diagnosis not present

## 2015-09-11 DIAGNOSIS — I10 Essential (primary) hypertension: Secondary | ICD-10-CM | POA: Diagnosis not present

## 2015-09-11 DIAGNOSIS — R410 Disorientation, unspecified: Secondary | ICD-10-CM | POA: Diagnosis not present

## 2015-09-11 DIAGNOSIS — G9340 Encephalopathy, unspecified: Secondary | ICD-10-CM | POA: Diagnosis not present

## 2015-09-18 DIAGNOSIS — R32 Unspecified urinary incontinence: Secondary | ICD-10-CM | POA: Diagnosis not present

## 2015-09-18 DIAGNOSIS — F329 Major depressive disorder, single episode, unspecified: Secondary | ICD-10-CM | POA: Diagnosis not present

## 2015-09-18 DIAGNOSIS — I10 Essential (primary) hypertension: Secondary | ICD-10-CM | POA: Diagnosis not present

## 2015-09-18 DIAGNOSIS — M5136 Other intervertebral disc degeneration, lumbar region: Secondary | ICD-10-CM | POA: Diagnosis not present

## 2015-09-18 DIAGNOSIS — Z8542 Personal history of malignant neoplasm of other parts of uterus: Secondary | ICD-10-CM | POA: Diagnosis not present

## 2015-09-18 DIAGNOSIS — Z87440 Personal history of urinary (tract) infections: Secondary | ICD-10-CM | POA: Diagnosis not present

## 2015-09-18 DIAGNOSIS — Z9181 History of falling: Secondary | ICD-10-CM | POA: Diagnosis not present

## 2015-09-18 DIAGNOSIS — M1991 Primary osteoarthritis, unspecified site: Secondary | ICD-10-CM | POA: Diagnosis not present

## 2015-09-18 DIAGNOSIS — Z7982 Long term (current) use of aspirin: Secondary | ICD-10-CM | POA: Diagnosis not present

## 2015-09-18 DIAGNOSIS — M545 Low back pain: Secondary | ICD-10-CM | POA: Diagnosis not present

## 2015-09-18 DIAGNOSIS — G8929 Other chronic pain: Secondary | ICD-10-CM | POA: Diagnosis not present

## 2015-09-18 DIAGNOSIS — K5730 Diverticulosis of large intestine without perforation or abscess without bleeding: Secondary | ICD-10-CM | POA: Diagnosis not present

## 2015-09-18 DIAGNOSIS — S22080D Wedge compression fracture of T11-T12 vertebra, subsequent encounter for fracture with routine healing: Secondary | ICD-10-CM | POA: Diagnosis not present

## 2015-09-18 DIAGNOSIS — F419 Anxiety disorder, unspecified: Secondary | ICD-10-CM | POA: Diagnosis not present

## 2015-09-18 DIAGNOSIS — M810 Age-related osteoporosis without current pathological fracture: Secondary | ICD-10-CM | POA: Diagnosis not present

## 2015-09-18 DIAGNOSIS — M25562 Pain in left knee: Secondary | ICD-10-CM | POA: Diagnosis not present

## 2015-09-18 DIAGNOSIS — S22089A Unspecified fracture of T11-T12 vertebra, initial encounter for closed fracture: Secondary | ICD-10-CM | POA: Diagnosis not present

## 2015-09-22 DIAGNOSIS — S22080D Wedge compression fracture of T11-T12 vertebra, subsequent encounter for fracture with routine healing: Secondary | ICD-10-CM | POA: Diagnosis not present

## 2015-09-22 DIAGNOSIS — M25562 Pain in left knee: Secondary | ICD-10-CM | POA: Diagnosis not present

## 2015-09-22 DIAGNOSIS — M1991 Primary osteoarthritis, unspecified site: Secondary | ICD-10-CM | POA: Diagnosis not present

## 2015-09-22 DIAGNOSIS — I10 Essential (primary) hypertension: Secondary | ICD-10-CM | POA: Diagnosis not present

## 2015-09-22 DIAGNOSIS — G8929 Other chronic pain: Secondary | ICD-10-CM | POA: Diagnosis not present

## 2015-09-22 DIAGNOSIS — F329 Major depressive disorder, single episode, unspecified: Secondary | ICD-10-CM | POA: Diagnosis not present

## 2015-09-23 DIAGNOSIS — M1991 Primary osteoarthritis, unspecified site: Secondary | ICD-10-CM | POA: Diagnosis not present

## 2015-09-23 DIAGNOSIS — M25562 Pain in left knee: Secondary | ICD-10-CM | POA: Diagnosis not present

## 2015-09-23 DIAGNOSIS — F329 Major depressive disorder, single episode, unspecified: Secondary | ICD-10-CM | POA: Diagnosis not present

## 2015-09-23 DIAGNOSIS — S22080D Wedge compression fracture of T11-T12 vertebra, subsequent encounter for fracture with routine healing: Secondary | ICD-10-CM | POA: Diagnosis not present

## 2015-09-23 DIAGNOSIS — I10 Essential (primary) hypertension: Secondary | ICD-10-CM | POA: Diagnosis not present

## 2015-09-23 DIAGNOSIS — G8929 Other chronic pain: Secondary | ICD-10-CM | POA: Diagnosis not present

## 2015-09-25 DIAGNOSIS — G8929 Other chronic pain: Secondary | ICD-10-CM | POA: Diagnosis not present

## 2015-09-25 DIAGNOSIS — S22080D Wedge compression fracture of T11-T12 vertebra, subsequent encounter for fracture with routine healing: Secondary | ICD-10-CM | POA: Diagnosis not present

## 2015-09-25 DIAGNOSIS — M25562 Pain in left knee: Secondary | ICD-10-CM | POA: Diagnosis not present

## 2015-09-25 DIAGNOSIS — I10 Essential (primary) hypertension: Secondary | ICD-10-CM | POA: Diagnosis not present

## 2015-09-25 DIAGNOSIS — F329 Major depressive disorder, single episode, unspecified: Secondary | ICD-10-CM | POA: Diagnosis not present

## 2015-09-25 DIAGNOSIS — M1991 Primary osteoarthritis, unspecified site: Secondary | ICD-10-CM | POA: Diagnosis not present

## 2015-09-26 DIAGNOSIS — M25562 Pain in left knee: Secondary | ICD-10-CM | POA: Diagnosis not present

## 2015-09-26 DIAGNOSIS — I10 Essential (primary) hypertension: Secondary | ICD-10-CM | POA: Diagnosis not present

## 2015-09-26 DIAGNOSIS — G8929 Other chronic pain: Secondary | ICD-10-CM | POA: Diagnosis not present

## 2015-09-26 DIAGNOSIS — M1991 Primary osteoarthritis, unspecified site: Secondary | ICD-10-CM | POA: Diagnosis not present

## 2015-09-26 DIAGNOSIS — S22080D Wedge compression fracture of T11-T12 vertebra, subsequent encounter for fracture with routine healing: Secondary | ICD-10-CM | POA: Diagnosis not present

## 2015-09-26 DIAGNOSIS — F329 Major depressive disorder, single episode, unspecified: Secondary | ICD-10-CM | POA: Diagnosis not present

## 2015-09-29 DIAGNOSIS — F329 Major depressive disorder, single episode, unspecified: Secondary | ICD-10-CM | POA: Diagnosis not present

## 2015-09-29 DIAGNOSIS — M25562 Pain in left knee: Secondary | ICD-10-CM | POA: Diagnosis not present

## 2015-09-29 DIAGNOSIS — G8929 Other chronic pain: Secondary | ICD-10-CM | POA: Diagnosis not present

## 2015-09-29 DIAGNOSIS — S22080D Wedge compression fracture of T11-T12 vertebra, subsequent encounter for fracture with routine healing: Secondary | ICD-10-CM | POA: Diagnosis not present

## 2015-09-29 DIAGNOSIS — I10 Essential (primary) hypertension: Secondary | ICD-10-CM | POA: Diagnosis not present

## 2015-09-29 DIAGNOSIS — M1991 Primary osteoarthritis, unspecified site: Secondary | ICD-10-CM | POA: Diagnosis not present

## 2015-09-30 DIAGNOSIS — G8929 Other chronic pain: Secondary | ICD-10-CM | POA: Diagnosis not present

## 2015-09-30 DIAGNOSIS — M25562 Pain in left knee: Secondary | ICD-10-CM | POA: Diagnosis not present

## 2015-09-30 DIAGNOSIS — M1991 Primary osteoarthritis, unspecified site: Secondary | ICD-10-CM | POA: Diagnosis not present

## 2015-09-30 DIAGNOSIS — F329 Major depressive disorder, single episode, unspecified: Secondary | ICD-10-CM | POA: Diagnosis not present

## 2015-09-30 DIAGNOSIS — S22080D Wedge compression fracture of T11-T12 vertebra, subsequent encounter for fracture with routine healing: Secondary | ICD-10-CM | POA: Diagnosis not present

## 2015-09-30 DIAGNOSIS — I10 Essential (primary) hypertension: Secondary | ICD-10-CM | POA: Diagnosis not present

## 2015-10-01 DIAGNOSIS — I10 Essential (primary) hypertension: Secondary | ICD-10-CM | POA: Diagnosis not present

## 2015-10-01 DIAGNOSIS — G8929 Other chronic pain: Secondary | ICD-10-CM | POA: Diagnosis not present

## 2015-10-01 DIAGNOSIS — F329 Major depressive disorder, single episode, unspecified: Secondary | ICD-10-CM | POA: Diagnosis not present

## 2015-10-01 DIAGNOSIS — S22080D Wedge compression fracture of T11-T12 vertebra, subsequent encounter for fracture with routine healing: Secondary | ICD-10-CM | POA: Diagnosis not present

## 2015-10-01 DIAGNOSIS — M1991 Primary osteoarthritis, unspecified site: Secondary | ICD-10-CM | POA: Diagnosis not present

## 2015-10-01 DIAGNOSIS — E876 Hypokalemia: Secondary | ICD-10-CM | POA: Diagnosis not present

## 2015-10-01 DIAGNOSIS — M25562 Pain in left knee: Secondary | ICD-10-CM | POA: Diagnosis not present

## 2015-10-02 DIAGNOSIS — I10 Essential (primary) hypertension: Secondary | ICD-10-CM | POA: Diagnosis not present

## 2015-10-02 DIAGNOSIS — S22080D Wedge compression fracture of T11-T12 vertebra, subsequent encounter for fracture with routine healing: Secondary | ICD-10-CM | POA: Diagnosis not present

## 2015-10-02 DIAGNOSIS — M25562 Pain in left knee: Secondary | ICD-10-CM | POA: Diagnosis not present

## 2015-10-02 DIAGNOSIS — F329 Major depressive disorder, single episode, unspecified: Secondary | ICD-10-CM | POA: Diagnosis not present

## 2015-10-02 DIAGNOSIS — M1991 Primary osteoarthritis, unspecified site: Secondary | ICD-10-CM | POA: Diagnosis not present

## 2015-10-02 DIAGNOSIS — G8929 Other chronic pain: Secondary | ICD-10-CM | POA: Diagnosis not present

## 2015-10-03 DIAGNOSIS — I10 Essential (primary) hypertension: Secondary | ICD-10-CM | POA: Diagnosis not present

## 2015-10-03 DIAGNOSIS — E739 Lactose intolerance, unspecified: Secondary | ICD-10-CM | POA: Diagnosis not present

## 2015-10-03 DIAGNOSIS — Z23 Encounter for immunization: Secondary | ICD-10-CM | POA: Diagnosis not present

## 2015-10-03 DIAGNOSIS — F329 Major depressive disorder, single episode, unspecified: Secondary | ICD-10-CM | POA: Diagnosis not present

## 2015-10-03 DIAGNOSIS — M549 Dorsalgia, unspecified: Secondary | ICD-10-CM | POA: Diagnosis not present

## 2015-10-03 DIAGNOSIS — M1712 Unilateral primary osteoarthritis, left knee: Secondary | ICD-10-CM | POA: Diagnosis not present

## 2015-10-06 DIAGNOSIS — S22080D Wedge compression fracture of T11-T12 vertebra, subsequent encounter for fracture with routine healing: Secondary | ICD-10-CM | POA: Diagnosis not present

## 2015-10-06 DIAGNOSIS — G8929 Other chronic pain: Secondary | ICD-10-CM | POA: Diagnosis not present

## 2015-10-06 DIAGNOSIS — I10 Essential (primary) hypertension: Secondary | ICD-10-CM | POA: Diagnosis not present

## 2015-10-06 DIAGNOSIS — F329 Major depressive disorder, single episode, unspecified: Secondary | ICD-10-CM | POA: Diagnosis not present

## 2015-10-06 DIAGNOSIS — M25562 Pain in left knee: Secondary | ICD-10-CM | POA: Diagnosis not present

## 2015-10-06 DIAGNOSIS — M1991 Primary osteoarthritis, unspecified site: Secondary | ICD-10-CM | POA: Diagnosis not present

## 2015-10-07 DIAGNOSIS — S22080D Wedge compression fracture of T11-T12 vertebra, subsequent encounter for fracture with routine healing: Secondary | ICD-10-CM | POA: Diagnosis not present

## 2015-10-07 DIAGNOSIS — I10 Essential (primary) hypertension: Secondary | ICD-10-CM | POA: Diagnosis not present

## 2015-10-07 DIAGNOSIS — M25562 Pain in left knee: Secondary | ICD-10-CM | POA: Diagnosis not present

## 2015-10-07 DIAGNOSIS — M1991 Primary osteoarthritis, unspecified site: Secondary | ICD-10-CM | POA: Diagnosis not present

## 2015-10-07 DIAGNOSIS — G8929 Other chronic pain: Secondary | ICD-10-CM | POA: Diagnosis not present

## 2015-10-07 DIAGNOSIS — F329 Major depressive disorder, single episode, unspecified: Secondary | ICD-10-CM | POA: Diagnosis not present

## 2015-10-08 DIAGNOSIS — G8929 Other chronic pain: Secondary | ICD-10-CM | POA: Diagnosis not present

## 2015-10-08 DIAGNOSIS — I10 Essential (primary) hypertension: Secondary | ICD-10-CM | POA: Diagnosis not present

## 2015-10-08 DIAGNOSIS — M1991 Primary osteoarthritis, unspecified site: Secondary | ICD-10-CM | POA: Diagnosis not present

## 2015-10-08 DIAGNOSIS — F329 Major depressive disorder, single episode, unspecified: Secondary | ICD-10-CM | POA: Diagnosis not present

## 2015-10-08 DIAGNOSIS — S22080D Wedge compression fracture of T11-T12 vertebra, subsequent encounter for fracture with routine healing: Secondary | ICD-10-CM | POA: Diagnosis not present

## 2015-10-08 DIAGNOSIS — M25562 Pain in left knee: Secondary | ICD-10-CM | POA: Diagnosis not present

## 2015-10-09 DIAGNOSIS — G8929 Other chronic pain: Secondary | ICD-10-CM | POA: Diagnosis not present

## 2015-10-09 DIAGNOSIS — I10 Essential (primary) hypertension: Secondary | ICD-10-CM | POA: Diagnosis not present

## 2015-10-09 DIAGNOSIS — M25562 Pain in left knee: Secondary | ICD-10-CM | POA: Diagnosis not present

## 2015-10-09 DIAGNOSIS — F329 Major depressive disorder, single episode, unspecified: Secondary | ICD-10-CM | POA: Diagnosis not present

## 2015-10-09 DIAGNOSIS — M1991 Primary osteoarthritis, unspecified site: Secondary | ICD-10-CM | POA: Diagnosis not present

## 2015-10-09 DIAGNOSIS — S22080D Wedge compression fracture of T11-T12 vertebra, subsequent encounter for fracture with routine healing: Secondary | ICD-10-CM | POA: Diagnosis not present

## 2015-10-13 DIAGNOSIS — F329 Major depressive disorder, single episode, unspecified: Secondary | ICD-10-CM | POA: Diagnosis not present

## 2015-10-13 DIAGNOSIS — S22080D Wedge compression fracture of T11-T12 vertebra, subsequent encounter for fracture with routine healing: Secondary | ICD-10-CM | POA: Diagnosis not present

## 2015-10-13 DIAGNOSIS — M25562 Pain in left knee: Secondary | ICD-10-CM | POA: Diagnosis not present

## 2015-10-13 DIAGNOSIS — G8929 Other chronic pain: Secondary | ICD-10-CM | POA: Diagnosis not present

## 2015-10-13 DIAGNOSIS — I10 Essential (primary) hypertension: Secondary | ICD-10-CM | POA: Diagnosis not present

## 2015-10-13 DIAGNOSIS — M1991 Primary osteoarthritis, unspecified site: Secondary | ICD-10-CM | POA: Diagnosis not present

## 2015-10-15 DIAGNOSIS — M1991 Primary osteoarthritis, unspecified site: Secondary | ICD-10-CM | POA: Diagnosis not present

## 2015-10-15 DIAGNOSIS — S22080D Wedge compression fracture of T11-T12 vertebra, subsequent encounter for fracture with routine healing: Secondary | ICD-10-CM | POA: Diagnosis not present

## 2015-10-15 DIAGNOSIS — F329 Major depressive disorder, single episode, unspecified: Secondary | ICD-10-CM | POA: Diagnosis not present

## 2015-10-15 DIAGNOSIS — M25562 Pain in left knee: Secondary | ICD-10-CM | POA: Diagnosis not present

## 2015-10-15 DIAGNOSIS — G8929 Other chronic pain: Secondary | ICD-10-CM | POA: Diagnosis not present

## 2015-10-15 DIAGNOSIS — I10 Essential (primary) hypertension: Secondary | ICD-10-CM | POA: Diagnosis not present

## 2015-10-22 DIAGNOSIS — E739 Lactose intolerance, unspecified: Secondary | ICD-10-CM | POA: Diagnosis not present

## 2015-10-22 DIAGNOSIS — M170 Bilateral primary osteoarthritis of knee: Secondary | ICD-10-CM | POA: Diagnosis not present

## 2015-10-22 DIAGNOSIS — I10 Essential (primary) hypertension: Secondary | ICD-10-CM | POA: Diagnosis not present

## 2015-10-22 DIAGNOSIS — M1712 Unilateral primary osteoarthritis, left knee: Secondary | ICD-10-CM | POA: Diagnosis not present

## 2015-10-22 DIAGNOSIS — M539 Dorsopathy, unspecified: Secondary | ICD-10-CM | POA: Diagnosis not present

## 2015-10-29 DIAGNOSIS — M170 Bilateral primary osteoarthritis of knee: Secondary | ICD-10-CM | POA: Diagnosis not present

## 2015-10-29 DIAGNOSIS — M1712 Unilateral primary osteoarthritis, left knee: Secondary | ICD-10-CM | POA: Diagnosis not present

## 2015-10-29 DIAGNOSIS — Z6825 Body mass index (BMI) 25.0-25.9, adult: Secondary | ICD-10-CM | POA: Diagnosis not present

## 2015-11-05 DIAGNOSIS — M170 Bilateral primary osteoarthritis of knee: Secondary | ICD-10-CM | POA: Diagnosis not present

## 2015-11-12 DIAGNOSIS — M1712 Unilateral primary osteoarthritis, left knee: Secondary | ICD-10-CM | POA: Diagnosis not present

## 2015-11-19 DIAGNOSIS — M170 Bilateral primary osteoarthritis of knee: Secondary | ICD-10-CM | POA: Diagnosis not present

## 2015-12-02 DIAGNOSIS — Z6825 Body mass index (BMI) 25.0-25.9, adult: Secondary | ICD-10-CM | POA: Diagnosis not present

## 2015-12-02 DIAGNOSIS — M1712 Unilateral primary osteoarthritis, left knee: Secondary | ICD-10-CM | POA: Diagnosis not present

## 2015-12-02 DIAGNOSIS — E876 Hypokalemia: Secondary | ICD-10-CM | POA: Diagnosis not present

## 2015-12-09 DIAGNOSIS — E079 Disorder of thyroid, unspecified: Secondary | ICD-10-CM | POA: Diagnosis not present

## 2015-12-09 DIAGNOSIS — I10 Essential (primary) hypertension: Secondary | ICD-10-CM | POA: Diagnosis not present

## 2015-12-09 DIAGNOSIS — M1712 Unilateral primary osteoarthritis, left knee: Secondary | ICD-10-CM | POA: Diagnosis not present

## 2015-12-17 DIAGNOSIS — M1712 Unilateral primary osteoarthritis, left knee: Secondary | ICD-10-CM | POA: Diagnosis not present

## 2015-12-17 DIAGNOSIS — E079 Disorder of thyroid, unspecified: Secondary | ICD-10-CM | POA: Diagnosis not present

## 2015-12-17 DIAGNOSIS — E876 Hypokalemia: Secondary | ICD-10-CM | POA: Diagnosis not present

## 2015-12-17 DIAGNOSIS — Z6825 Body mass index (BMI) 25.0-25.9, adult: Secondary | ICD-10-CM | POA: Diagnosis not present

## 2015-12-23 DIAGNOSIS — R197 Diarrhea, unspecified: Secondary | ICD-10-CM | POA: Diagnosis not present

## 2015-12-23 DIAGNOSIS — Z6825 Body mass index (BMI) 25.0-25.9, adult: Secondary | ICD-10-CM | POA: Diagnosis not present

## 2015-12-23 DIAGNOSIS — M1712 Unilateral primary osteoarthritis, left knee: Secondary | ICD-10-CM | POA: Diagnosis not present

## 2015-12-23 DIAGNOSIS — E739 Lactose intolerance, unspecified: Secondary | ICD-10-CM | POA: Diagnosis not present

## 2015-12-30 DIAGNOSIS — I10 Essential (primary) hypertension: Secondary | ICD-10-CM | POA: Diagnosis not present

## 2015-12-30 DIAGNOSIS — Z6825 Body mass index (BMI) 25.0-25.9, adult: Secondary | ICD-10-CM | POA: Diagnosis not present

## 2015-12-30 DIAGNOSIS — M1712 Unilateral primary osteoarthritis, left knee: Secondary | ICD-10-CM | POA: Diagnosis not present

## 2015-12-30 DIAGNOSIS — M170 Bilateral primary osteoarthritis of knee: Secondary | ICD-10-CM | POA: Diagnosis not present

## 2015-12-30 DIAGNOSIS — R197 Diarrhea, unspecified: Secondary | ICD-10-CM | POA: Diagnosis not present

## 2016-03-15 DIAGNOSIS — E876 Hypokalemia: Secondary | ICD-10-CM | POA: Diagnosis not present

## 2016-03-17 DIAGNOSIS — M170 Bilateral primary osteoarthritis of knee: Secondary | ICD-10-CM | POA: Diagnosis not present

## 2016-03-17 DIAGNOSIS — I10 Essential (primary) hypertension: Secondary | ICD-10-CM | POA: Diagnosis not present

## 2016-03-17 DIAGNOSIS — E876 Hypokalemia: Secondary | ICD-10-CM | POA: Diagnosis not present

## 2016-03-17 DIAGNOSIS — E739 Lactose intolerance, unspecified: Secondary | ICD-10-CM | POA: Diagnosis not present

## 2016-04-20 DIAGNOSIS — G4700 Insomnia, unspecified: Secondary | ICD-10-CM | POA: Diagnosis not present

## 2016-04-20 DIAGNOSIS — I10 Essential (primary) hypertension: Secondary | ICD-10-CM | POA: Diagnosis not present

## 2016-04-20 DIAGNOSIS — M549 Dorsalgia, unspecified: Secondary | ICD-10-CM | POA: Diagnosis not present

## 2016-04-20 DIAGNOSIS — M170 Bilateral primary osteoarthritis of knee: Secondary | ICD-10-CM | POA: Diagnosis not present

## 2016-04-23 DIAGNOSIS — M170 Bilateral primary osteoarthritis of knee: Secondary | ICD-10-CM | POA: Diagnosis not present

## 2016-04-23 DIAGNOSIS — F411 Generalized anxiety disorder: Secondary | ICD-10-CM | POA: Diagnosis not present

## 2016-04-23 DIAGNOSIS — M545 Low back pain: Secondary | ICD-10-CM | POA: Diagnosis not present

## 2016-04-23 DIAGNOSIS — G4700 Insomnia, unspecified: Secondary | ICD-10-CM | POA: Diagnosis not present

## 2016-04-23 DIAGNOSIS — F329 Major depressive disorder, single episode, unspecified: Secondary | ICD-10-CM | POA: Diagnosis not present

## 2016-04-23 DIAGNOSIS — I10 Essential (primary) hypertension: Secondary | ICD-10-CM | POA: Diagnosis not present

## 2016-04-26 DIAGNOSIS — F411 Generalized anxiety disorder: Secondary | ICD-10-CM | POA: Diagnosis not present

## 2016-04-26 DIAGNOSIS — G4700 Insomnia, unspecified: Secondary | ICD-10-CM | POA: Diagnosis not present

## 2016-04-26 DIAGNOSIS — I10 Essential (primary) hypertension: Secondary | ICD-10-CM | POA: Diagnosis not present

## 2016-04-26 DIAGNOSIS — M170 Bilateral primary osteoarthritis of knee: Secondary | ICD-10-CM | POA: Diagnosis not present

## 2016-04-26 DIAGNOSIS — F329 Major depressive disorder, single episode, unspecified: Secondary | ICD-10-CM | POA: Diagnosis not present

## 2016-04-26 DIAGNOSIS — M545 Low back pain: Secondary | ICD-10-CM | POA: Diagnosis not present

## 2016-04-29 DIAGNOSIS — F329 Major depressive disorder, single episode, unspecified: Secondary | ICD-10-CM | POA: Diagnosis not present

## 2016-04-29 DIAGNOSIS — G4700 Insomnia, unspecified: Secondary | ICD-10-CM | POA: Diagnosis not present

## 2016-04-29 DIAGNOSIS — M545 Low back pain: Secondary | ICD-10-CM | POA: Diagnosis not present

## 2016-04-29 DIAGNOSIS — M170 Bilateral primary osteoarthritis of knee: Secondary | ICD-10-CM | POA: Diagnosis not present

## 2016-04-29 DIAGNOSIS — I10 Essential (primary) hypertension: Secondary | ICD-10-CM | POA: Diagnosis not present

## 2016-04-29 DIAGNOSIS — F411 Generalized anxiety disorder: Secondary | ICD-10-CM | POA: Diagnosis not present

## 2016-05-04 DIAGNOSIS — F329 Major depressive disorder, single episode, unspecified: Secondary | ICD-10-CM | POA: Diagnosis not present

## 2016-05-04 DIAGNOSIS — M545 Low back pain: Secondary | ICD-10-CM | POA: Diagnosis not present

## 2016-05-04 DIAGNOSIS — M170 Bilateral primary osteoarthritis of knee: Secondary | ICD-10-CM | POA: Diagnosis not present

## 2016-05-04 DIAGNOSIS — F411 Generalized anxiety disorder: Secondary | ICD-10-CM | POA: Diagnosis not present

## 2016-05-04 DIAGNOSIS — I10 Essential (primary) hypertension: Secondary | ICD-10-CM | POA: Diagnosis not present

## 2016-05-04 DIAGNOSIS — G4700 Insomnia, unspecified: Secondary | ICD-10-CM | POA: Diagnosis not present

## 2016-05-06 DIAGNOSIS — G4700 Insomnia, unspecified: Secondary | ICD-10-CM | POA: Diagnosis not present

## 2016-05-06 DIAGNOSIS — I10 Essential (primary) hypertension: Secondary | ICD-10-CM | POA: Diagnosis not present

## 2016-05-06 DIAGNOSIS — M170 Bilateral primary osteoarthritis of knee: Secondary | ICD-10-CM | POA: Diagnosis not present

## 2016-05-06 DIAGNOSIS — F411 Generalized anxiety disorder: Secondary | ICD-10-CM | POA: Diagnosis not present

## 2016-05-06 DIAGNOSIS — M545 Low back pain: Secondary | ICD-10-CM | POA: Diagnosis not present

## 2016-05-06 DIAGNOSIS — F329 Major depressive disorder, single episode, unspecified: Secondary | ICD-10-CM | POA: Diagnosis not present

## 2016-05-11 DIAGNOSIS — M545 Low back pain: Secondary | ICD-10-CM | POA: Diagnosis not present

## 2016-05-11 DIAGNOSIS — M170 Bilateral primary osteoarthritis of knee: Secondary | ICD-10-CM | POA: Diagnosis not present

## 2016-05-11 DIAGNOSIS — G4700 Insomnia, unspecified: Secondary | ICD-10-CM | POA: Diagnosis not present

## 2016-05-11 DIAGNOSIS — I10 Essential (primary) hypertension: Secondary | ICD-10-CM | POA: Diagnosis not present

## 2016-05-11 DIAGNOSIS — F329 Major depressive disorder, single episode, unspecified: Secondary | ICD-10-CM | POA: Diagnosis not present

## 2016-05-11 DIAGNOSIS — F411 Generalized anxiety disorder: Secondary | ICD-10-CM | POA: Diagnosis not present

## 2016-05-12 DIAGNOSIS — I10 Essential (primary) hypertension: Secondary | ICD-10-CM | POA: Diagnosis not present

## 2016-05-12 DIAGNOSIS — F329 Major depressive disorder, single episode, unspecified: Secondary | ICD-10-CM | POA: Diagnosis not present

## 2016-05-12 DIAGNOSIS — M170 Bilateral primary osteoarthritis of knee: Secondary | ICD-10-CM | POA: Diagnosis not present

## 2016-05-12 DIAGNOSIS — M545 Low back pain: Secondary | ICD-10-CM | POA: Diagnosis not present

## 2016-05-12 DIAGNOSIS — G4700 Insomnia, unspecified: Secondary | ICD-10-CM | POA: Diagnosis not present

## 2016-05-12 DIAGNOSIS — F411 Generalized anxiety disorder: Secondary | ICD-10-CM | POA: Diagnosis not present

## 2016-05-12 DIAGNOSIS — M549 Dorsalgia, unspecified: Secondary | ICD-10-CM | POA: Diagnosis not present

## 2016-05-18 DIAGNOSIS — M170 Bilateral primary osteoarthritis of knee: Secondary | ICD-10-CM | POA: Diagnosis not present

## 2016-05-18 DIAGNOSIS — F329 Major depressive disorder, single episode, unspecified: Secondary | ICD-10-CM | POA: Diagnosis not present

## 2016-05-18 DIAGNOSIS — G4700 Insomnia, unspecified: Secondary | ICD-10-CM | POA: Diagnosis not present

## 2016-05-18 DIAGNOSIS — F411 Generalized anxiety disorder: Secondary | ICD-10-CM | POA: Diagnosis not present

## 2016-05-18 DIAGNOSIS — I10 Essential (primary) hypertension: Secondary | ICD-10-CM | POA: Diagnosis not present

## 2016-05-18 DIAGNOSIS — M545 Low back pain: Secondary | ICD-10-CM | POA: Diagnosis not present

## 2016-05-19 DIAGNOSIS — M170 Bilateral primary osteoarthritis of knee: Secondary | ICD-10-CM | POA: Diagnosis not present

## 2016-05-20 DIAGNOSIS — M170 Bilateral primary osteoarthritis of knee: Secondary | ICD-10-CM | POA: Diagnosis not present

## 2016-05-20 DIAGNOSIS — I10 Essential (primary) hypertension: Secondary | ICD-10-CM | POA: Diagnosis not present

## 2016-05-20 DIAGNOSIS — F411 Generalized anxiety disorder: Secondary | ICD-10-CM | POA: Diagnosis not present

## 2016-05-20 DIAGNOSIS — F329 Major depressive disorder, single episode, unspecified: Secondary | ICD-10-CM | POA: Diagnosis not present

## 2016-05-20 DIAGNOSIS — M545 Low back pain: Secondary | ICD-10-CM | POA: Diagnosis not present

## 2016-05-20 DIAGNOSIS — G4700 Insomnia, unspecified: Secondary | ICD-10-CM | POA: Diagnosis not present

## 2016-05-25 DIAGNOSIS — F411 Generalized anxiety disorder: Secondary | ICD-10-CM | POA: Diagnosis not present

## 2016-05-25 DIAGNOSIS — I10 Essential (primary) hypertension: Secondary | ICD-10-CM | POA: Diagnosis not present

## 2016-05-25 DIAGNOSIS — G4700 Insomnia, unspecified: Secondary | ICD-10-CM | POA: Diagnosis not present

## 2016-05-25 DIAGNOSIS — M170 Bilateral primary osteoarthritis of knee: Secondary | ICD-10-CM | POA: Diagnosis not present

## 2016-05-25 DIAGNOSIS — F329 Major depressive disorder, single episode, unspecified: Secondary | ICD-10-CM | POA: Diagnosis not present

## 2016-05-25 DIAGNOSIS — M545 Low back pain: Secondary | ICD-10-CM | POA: Diagnosis not present

## 2016-05-26 DIAGNOSIS — R140 Abdominal distension (gaseous): Secondary | ICD-10-CM | POA: Diagnosis not present

## 2016-05-26 DIAGNOSIS — M170 Bilateral primary osteoarthritis of knee: Secondary | ICD-10-CM | POA: Diagnosis not present

## 2016-05-26 DIAGNOSIS — R197 Diarrhea, unspecified: Secondary | ICD-10-CM | POA: Diagnosis not present

## 2016-05-28 DIAGNOSIS — F411 Generalized anxiety disorder: Secondary | ICD-10-CM | POA: Diagnosis not present

## 2016-05-28 DIAGNOSIS — I10 Essential (primary) hypertension: Secondary | ICD-10-CM | POA: Diagnosis not present

## 2016-05-28 DIAGNOSIS — F329 Major depressive disorder, single episode, unspecified: Secondary | ICD-10-CM | POA: Diagnosis not present

## 2016-05-28 DIAGNOSIS — M170 Bilateral primary osteoarthritis of knee: Secondary | ICD-10-CM | POA: Diagnosis not present

## 2016-05-28 DIAGNOSIS — M545 Low back pain: Secondary | ICD-10-CM | POA: Diagnosis not present

## 2016-05-28 DIAGNOSIS — G4700 Insomnia, unspecified: Secondary | ICD-10-CM | POA: Diagnosis not present

## 2016-06-01 DIAGNOSIS — M545 Low back pain: Secondary | ICD-10-CM | POA: Diagnosis not present

## 2016-06-01 DIAGNOSIS — M170 Bilateral primary osteoarthritis of knee: Secondary | ICD-10-CM | POA: Diagnosis not present

## 2016-06-01 DIAGNOSIS — F329 Major depressive disorder, single episode, unspecified: Secondary | ICD-10-CM | POA: Diagnosis not present

## 2016-06-01 DIAGNOSIS — F411 Generalized anxiety disorder: Secondary | ICD-10-CM | POA: Diagnosis not present

## 2016-06-01 DIAGNOSIS — G4700 Insomnia, unspecified: Secondary | ICD-10-CM | POA: Diagnosis not present

## 2016-06-01 DIAGNOSIS — I10 Essential (primary) hypertension: Secondary | ICD-10-CM | POA: Diagnosis not present

## 2016-06-02 DIAGNOSIS — M170 Bilateral primary osteoarthritis of knee: Secondary | ICD-10-CM | POA: Diagnosis not present

## 2016-06-03 DIAGNOSIS — F411 Generalized anxiety disorder: Secondary | ICD-10-CM | POA: Diagnosis not present

## 2016-06-03 DIAGNOSIS — G4700 Insomnia, unspecified: Secondary | ICD-10-CM | POA: Diagnosis not present

## 2016-06-03 DIAGNOSIS — I10 Essential (primary) hypertension: Secondary | ICD-10-CM | POA: Diagnosis not present

## 2016-06-03 DIAGNOSIS — M170 Bilateral primary osteoarthritis of knee: Secondary | ICD-10-CM | POA: Diagnosis not present

## 2016-06-03 DIAGNOSIS — M545 Low back pain: Secondary | ICD-10-CM | POA: Diagnosis not present

## 2016-06-03 DIAGNOSIS — F329 Major depressive disorder, single episode, unspecified: Secondary | ICD-10-CM | POA: Diagnosis not present

## 2016-06-08 DIAGNOSIS — F411 Generalized anxiety disorder: Secondary | ICD-10-CM | POA: Diagnosis not present

## 2016-06-08 DIAGNOSIS — M170 Bilateral primary osteoarthritis of knee: Secondary | ICD-10-CM | POA: Diagnosis not present

## 2016-06-08 DIAGNOSIS — M545 Low back pain: Secondary | ICD-10-CM | POA: Diagnosis not present

## 2016-06-08 DIAGNOSIS — I10 Essential (primary) hypertension: Secondary | ICD-10-CM | POA: Diagnosis not present

## 2016-06-08 DIAGNOSIS — F329 Major depressive disorder, single episode, unspecified: Secondary | ICD-10-CM | POA: Diagnosis not present

## 2016-06-08 DIAGNOSIS — G4700 Insomnia, unspecified: Secondary | ICD-10-CM | POA: Diagnosis not present

## 2016-06-10 DIAGNOSIS — I10 Essential (primary) hypertension: Secondary | ICD-10-CM | POA: Diagnosis not present

## 2016-06-10 DIAGNOSIS — F411 Generalized anxiety disorder: Secondary | ICD-10-CM | POA: Diagnosis not present

## 2016-06-10 DIAGNOSIS — M170 Bilateral primary osteoarthritis of knee: Secondary | ICD-10-CM | POA: Diagnosis not present

## 2016-06-10 DIAGNOSIS — M545 Low back pain: Secondary | ICD-10-CM | POA: Diagnosis not present

## 2016-06-10 DIAGNOSIS — G4700 Insomnia, unspecified: Secondary | ICD-10-CM | POA: Diagnosis not present

## 2016-06-10 DIAGNOSIS — F329 Major depressive disorder, single episode, unspecified: Secondary | ICD-10-CM | POA: Diagnosis not present

## 2016-06-14 DIAGNOSIS — F329 Major depressive disorder, single episode, unspecified: Secondary | ICD-10-CM | POA: Diagnosis not present

## 2016-06-14 DIAGNOSIS — M545 Low back pain: Secondary | ICD-10-CM | POA: Diagnosis not present

## 2016-06-14 DIAGNOSIS — G4700 Insomnia, unspecified: Secondary | ICD-10-CM | POA: Diagnosis not present

## 2016-06-14 DIAGNOSIS — M170 Bilateral primary osteoarthritis of knee: Secondary | ICD-10-CM | POA: Diagnosis not present

## 2016-06-14 DIAGNOSIS — I10 Essential (primary) hypertension: Secondary | ICD-10-CM | POA: Diagnosis not present

## 2016-06-14 DIAGNOSIS — F411 Generalized anxiety disorder: Secondary | ICD-10-CM | POA: Diagnosis not present

## 2016-06-16 DIAGNOSIS — F329 Major depressive disorder, single episode, unspecified: Secondary | ICD-10-CM | POA: Diagnosis not present

## 2016-06-16 DIAGNOSIS — M170 Bilateral primary osteoarthritis of knee: Secondary | ICD-10-CM | POA: Diagnosis not present

## 2016-06-16 DIAGNOSIS — G4700 Insomnia, unspecified: Secondary | ICD-10-CM | POA: Diagnosis not present

## 2016-06-16 DIAGNOSIS — I10 Essential (primary) hypertension: Secondary | ICD-10-CM | POA: Diagnosis not present

## 2016-06-16 DIAGNOSIS — Z6826 Body mass index (BMI) 26.0-26.9, adult: Secondary | ICD-10-CM | POA: Diagnosis not present

## 2016-06-16 DIAGNOSIS — M545 Low back pain: Secondary | ICD-10-CM | POA: Diagnosis not present

## 2016-06-16 DIAGNOSIS — F411 Generalized anxiety disorder: Secondary | ICD-10-CM | POA: Diagnosis not present

## 2016-09-06 DIAGNOSIS — Z23 Encounter for immunization: Secondary | ICD-10-CM | POA: Diagnosis not present

## 2016-09-06 DIAGNOSIS — Z0000 Encounter for general adult medical examination without abnormal findings: Secondary | ICD-10-CM | POA: Diagnosis not present

## 2016-10-05 DIAGNOSIS — Z23 Encounter for immunization: Secondary | ICD-10-CM | POA: Diagnosis not present

## 2016-11-30 DIAGNOSIS — M1712 Unilateral primary osteoarthritis, left knee: Secondary | ICD-10-CM | POA: Diagnosis not present

## 2016-11-30 DIAGNOSIS — M170 Bilateral primary osteoarthritis of knee: Secondary | ICD-10-CM | POA: Diagnosis not present

## 2016-11-30 DIAGNOSIS — I10 Essential (primary) hypertension: Secondary | ICD-10-CM | POA: Diagnosis not present

## 2016-11-30 DIAGNOSIS — Z6826 Body mass index (BMI) 26.0-26.9, adult: Secondary | ICD-10-CM | POA: Diagnosis not present

## 2016-11-30 DIAGNOSIS — B351 Tinea unguium: Secondary | ICD-10-CM | POA: Diagnosis not present

## 2016-12-10 DIAGNOSIS — M1712 Unilateral primary osteoarthritis, left knee: Secondary | ICD-10-CM | POA: Diagnosis not present

## 2016-12-17 DIAGNOSIS — I10 Essential (primary) hypertension: Secondary | ICD-10-CM | POA: Diagnosis not present

## 2016-12-17 DIAGNOSIS — M1712 Unilateral primary osteoarthritis, left knee: Secondary | ICD-10-CM | POA: Diagnosis not present

## 2016-12-17 DIAGNOSIS — Z6826 Body mass index (BMI) 26.0-26.9, adult: Secondary | ICD-10-CM | POA: Diagnosis not present

## 2016-12-24 DIAGNOSIS — M1712 Unilateral primary osteoarthritis, left knee: Secondary | ICD-10-CM | POA: Diagnosis not present

## 2016-12-31 DIAGNOSIS — M170 Bilateral primary osteoarthritis of knee: Secondary | ICD-10-CM | POA: Diagnosis not present

## 2017-01-07 DIAGNOSIS — M1712 Unilateral primary osteoarthritis, left knee: Secondary | ICD-10-CM | POA: Diagnosis not present

## 2017-02-02 DIAGNOSIS — M1712 Unilateral primary osteoarthritis, left knee: Secondary | ICD-10-CM | POA: Diagnosis not present

## 2017-02-09 DIAGNOSIS — L03032 Cellulitis of left toe: Secondary | ICD-10-CM | POA: Diagnosis not present

## 2017-02-09 DIAGNOSIS — M1712 Unilateral primary osteoarthritis, left knee: Secondary | ICD-10-CM | POA: Diagnosis not present

## 2017-02-21 DIAGNOSIS — M1712 Unilateral primary osteoarthritis, left knee: Secondary | ICD-10-CM | POA: Diagnosis not present

## 2017-02-21 DIAGNOSIS — L600 Ingrowing nail: Secondary | ICD-10-CM | POA: Diagnosis not present

## 2017-02-21 DIAGNOSIS — B351 Tinea unguium: Secondary | ICD-10-CM | POA: Diagnosis not present

## 2017-02-21 DIAGNOSIS — L03032 Cellulitis of left toe: Secondary | ICD-10-CM | POA: Diagnosis not present

## 2017-02-25 DIAGNOSIS — M1712 Unilateral primary osteoarthritis, left knee: Secondary | ICD-10-CM | POA: Diagnosis not present

## 2017-03-01 DIAGNOSIS — M1712 Unilateral primary osteoarthritis, left knee: Secondary | ICD-10-CM | POA: Diagnosis not present

## 2017-05-10 DIAGNOSIS — I10 Essential (primary) hypertension: Secondary | ICD-10-CM | POA: Diagnosis not present

## 2017-05-10 DIAGNOSIS — E079 Disorder of thyroid, unspecified: Secondary | ICD-10-CM | POA: Diagnosis not present

## 2017-05-10 DIAGNOSIS — E782 Mixed hyperlipidemia: Secondary | ICD-10-CM | POA: Diagnosis not present

## 2017-05-10 DIAGNOSIS — Z79899 Other long term (current) drug therapy: Secondary | ICD-10-CM | POA: Diagnosis not present

## 2017-05-13 DIAGNOSIS — F411 Generalized anxiety disorder: Secondary | ICD-10-CM | POA: Diagnosis not present

## 2017-05-13 DIAGNOSIS — G4700 Insomnia, unspecified: Secondary | ICD-10-CM | POA: Diagnosis not present

## 2017-05-13 DIAGNOSIS — E782 Mixed hyperlipidemia: Secondary | ICD-10-CM | POA: Diagnosis not present

## 2017-05-13 DIAGNOSIS — I10 Essential (primary) hypertension: Secondary | ICD-10-CM | POA: Diagnosis not present

## 2017-05-13 DIAGNOSIS — E876 Hypokalemia: Secondary | ICD-10-CM | POA: Diagnosis not present

## 2017-05-27 IMAGING — CT CT HEAD W/O CM
3 of 4 series · 18 of 47 positions shown, 21 images · non-contrast
Comparison: None.

CLINICAL DATA: Confusion as well as diarrhea.  Hyponatremia.

EXAM:
CT HEAD WITHOUT CONTRAST
TECHNIQUE: Contiguous axial images were obtained from the base of the skull
through the vertex without intravenous contrast.

[Series 201: head w/o, idose (1) · axial · non-contrast · 0.49mm/px · z∈[+66,+186]mm · 12 of 30 slices shown, 15 images]
[im 3/30  brain]
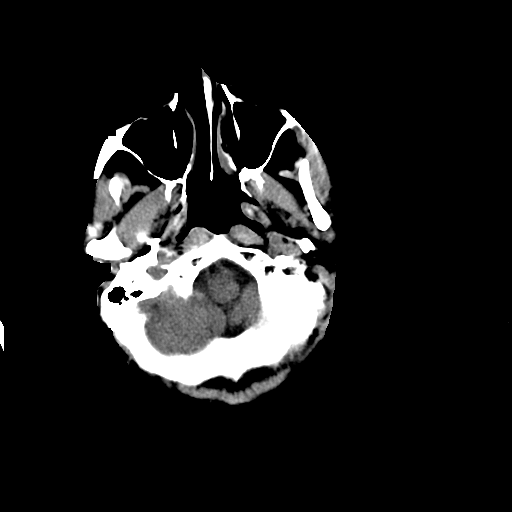
[im 3/30  bone]
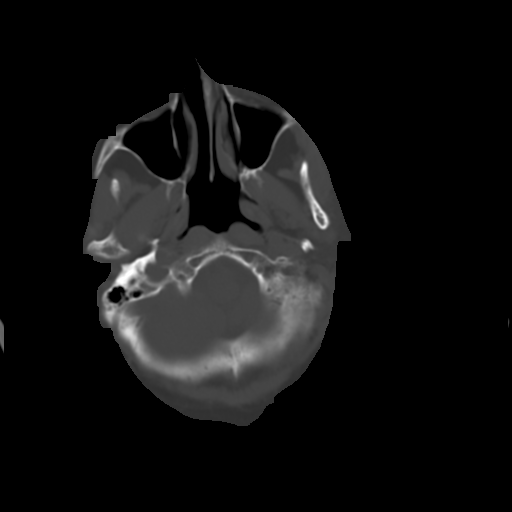
[im 5/30  brain]
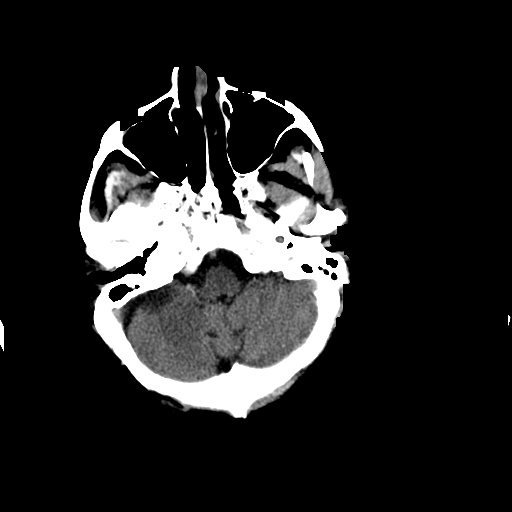
[im 7/30  brain]
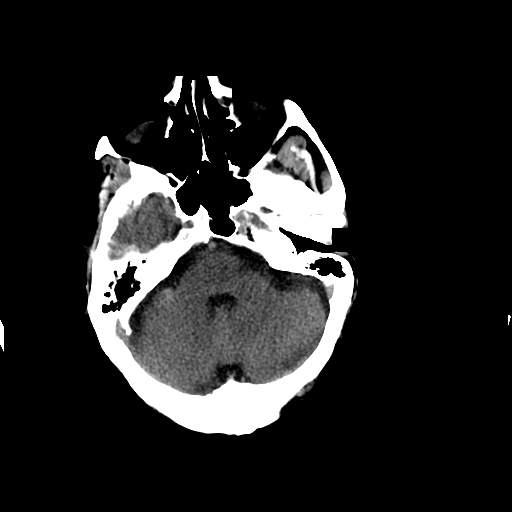
[im 9/30  brain]
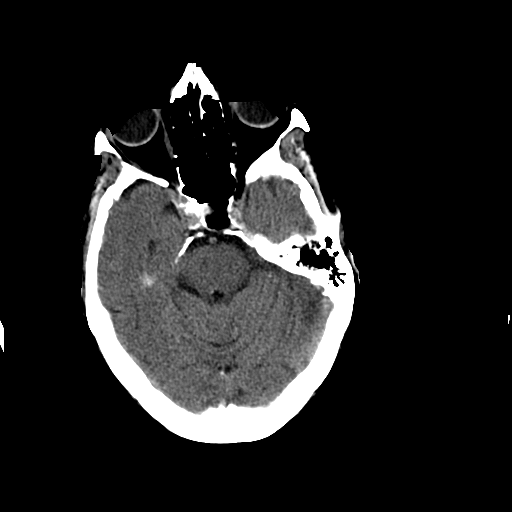
[im 11/30  brain]
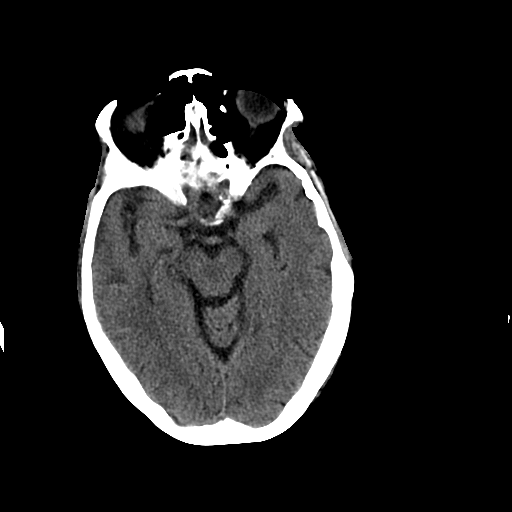
[im 11/30  bone]
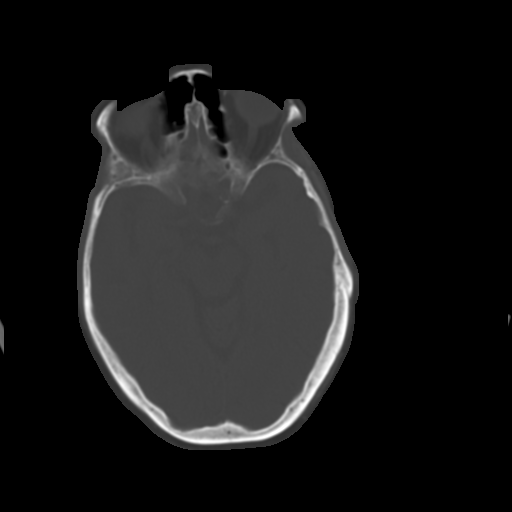
[im 13/30  brain]
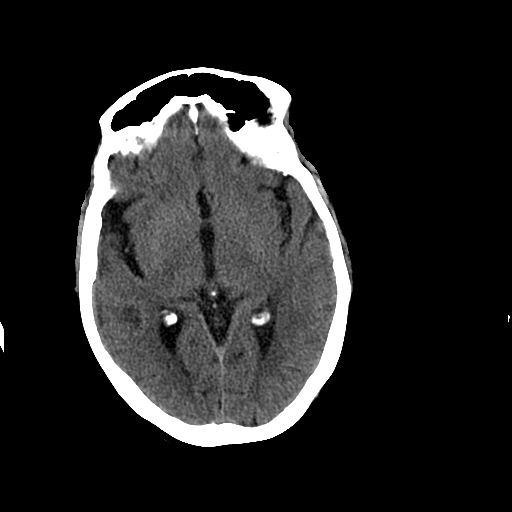
[im 17/30  brain]
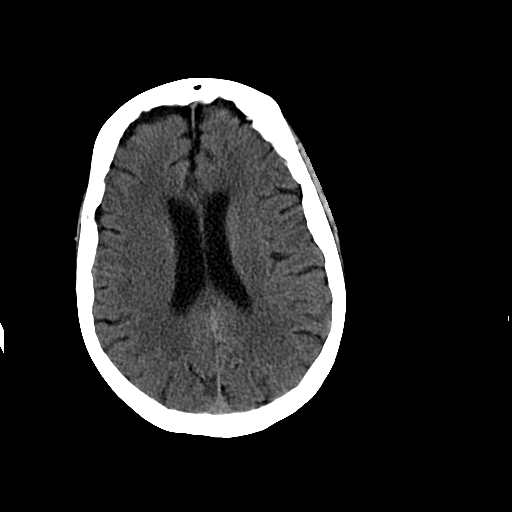
[im 19/30  brain]
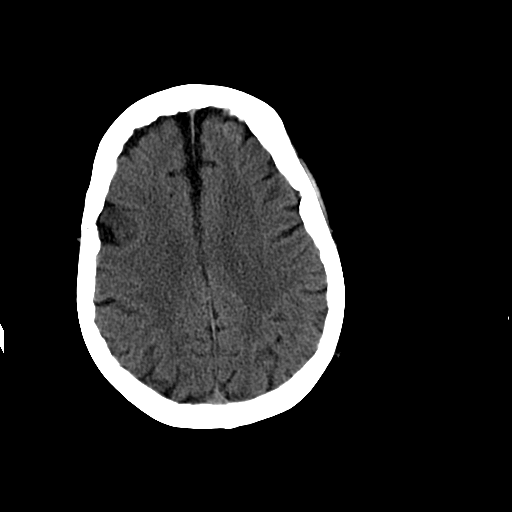
[im 21/30  brain]
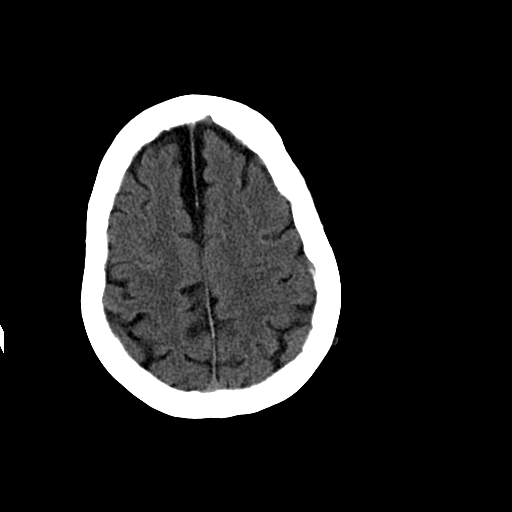
[im 21/30  bone]
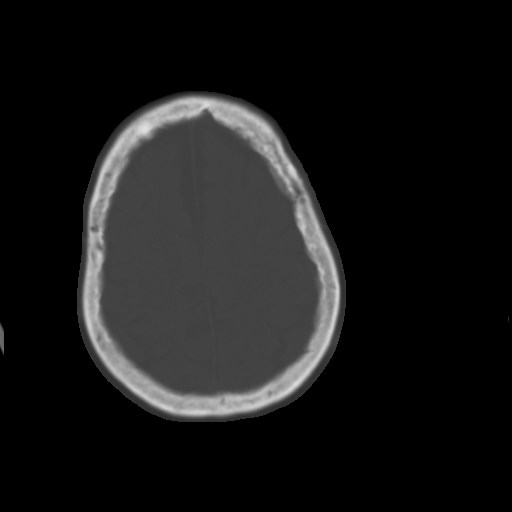
[im 23/30  brain]
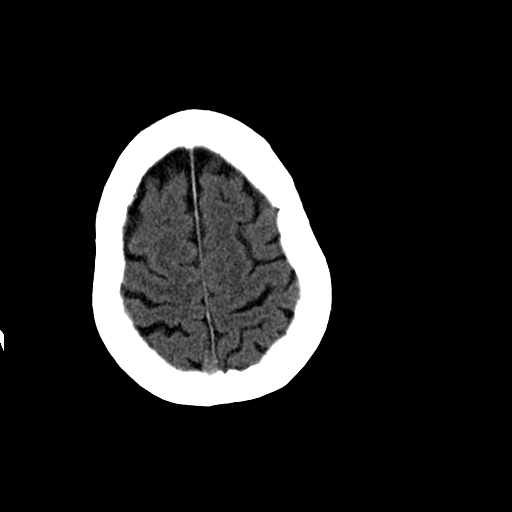
[im 25/30  brain]
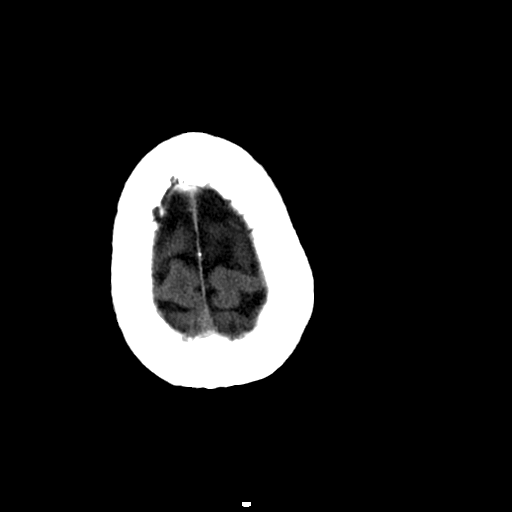
[im 27/30  brain]
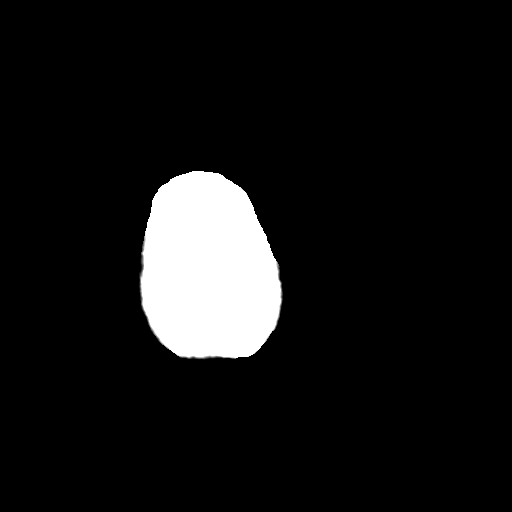

[Series 206: sagittal st, idose (1) · sagittal · 0.40mm/px · 3 of 47 slices shown]
[im 16/47  brain]
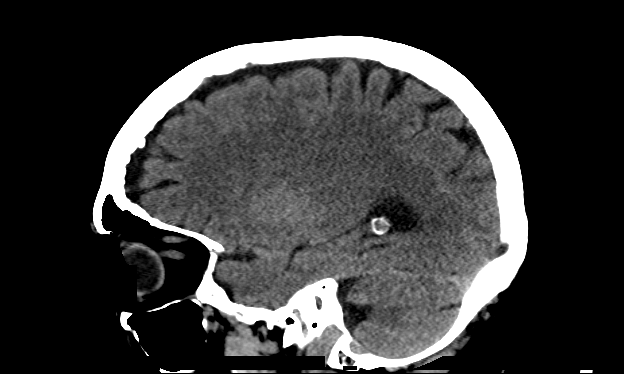
[im 24/47  brain]
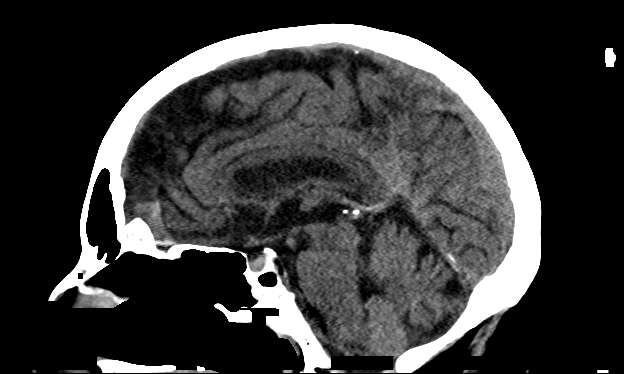
[im 31/47  brain]
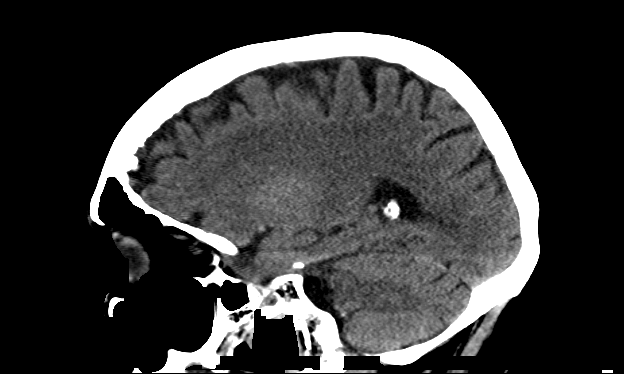

[Series 207: coronal st, idose (1) · coronal · 0.40mm/px · 3 of 61 slices shown]
[im 21/61  brain]
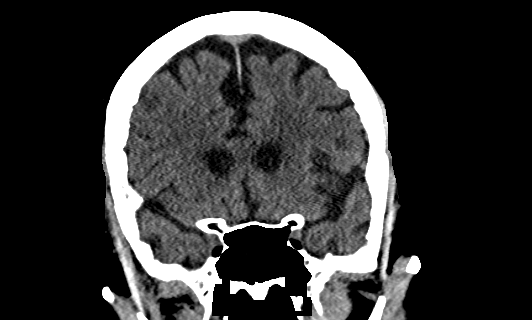
[im 27/61  brain]
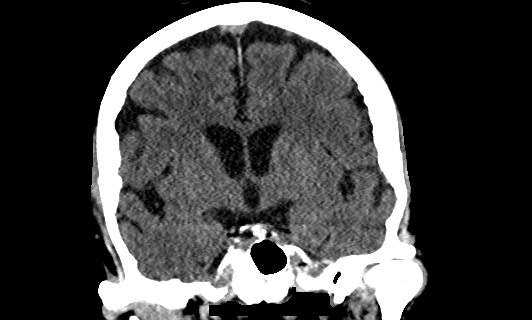
[im 34/61  brain]
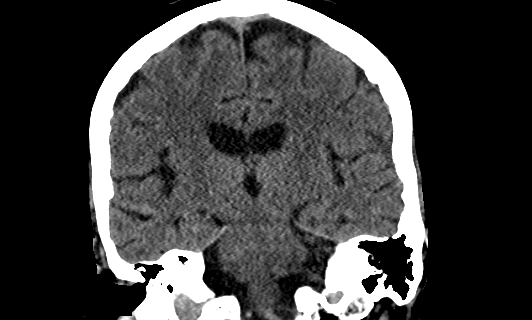

[18 of 47 positions shown; findings below may reference images not displayed]

FINDINGS: The ventricles, cisterns and other CSF spaces are within normal.
There is minimal chronic ischemic microvascular disease. There is no
mass, mass effect, shift of midline structures or acute hemorrhage.
No evidence of acute infarction. Remaining bones and soft tissues
are within normal.
IMPRESSION: No acute intracranial findings.

Subtle chronic ischemic microvascular disease.

## 2017-06-08 DIAGNOSIS — E876 Hypokalemia: Secondary | ICD-10-CM | POA: Diagnosis not present

## 2017-06-08 DIAGNOSIS — R7309 Other abnormal glucose: Secondary | ICD-10-CM | POA: Diagnosis not present

## 2017-06-10 DIAGNOSIS — R739 Hyperglycemia, unspecified: Secondary | ICD-10-CM | POA: Diagnosis not present

## 2017-06-10 DIAGNOSIS — I10 Essential (primary) hypertension: Secondary | ICD-10-CM | POA: Diagnosis not present

## 2017-06-10 DIAGNOSIS — G4700 Insomnia, unspecified: Secondary | ICD-10-CM | POA: Diagnosis not present

## 2017-06-10 DIAGNOSIS — Z6826 Body mass index (BMI) 26.0-26.9, adult: Secondary | ICD-10-CM | POA: Diagnosis not present

## 2017-06-10 DIAGNOSIS — R7309 Other abnormal glucose: Secondary | ICD-10-CM | POA: Diagnosis not present

## 2017-06-10 DIAGNOSIS — E876 Hypokalemia: Secondary | ICD-10-CM | POA: Diagnosis not present

## 2017-06-14 DIAGNOSIS — I10 Essential (primary) hypertension: Secondary | ICD-10-CM | POA: Diagnosis not present

## 2017-06-14 DIAGNOSIS — Z79899 Other long term (current) drug therapy: Secondary | ICD-10-CM | POA: Diagnosis not present

## 2017-06-14 DIAGNOSIS — E876 Hypokalemia: Secondary | ICD-10-CM | POA: Diagnosis not present

## 2017-06-17 DIAGNOSIS — Z6827 Body mass index (BMI) 27.0-27.9, adult: Secondary | ICD-10-CM | POA: Diagnosis not present

## 2017-06-17 DIAGNOSIS — I10 Essential (primary) hypertension: Secondary | ICD-10-CM | POA: Diagnosis not present

## 2017-06-17 DIAGNOSIS — R5383 Other fatigue: Secondary | ICD-10-CM | POA: Diagnosis not present

## 2017-06-17 DIAGNOSIS — E876 Hypokalemia: Secondary | ICD-10-CM | POA: Diagnosis not present

## 2017-07-04 ENCOUNTER — Other Ambulatory Visit: Payer: Self-pay

## 2017-07-04 DIAGNOSIS — D485 Neoplasm of uncertain behavior of skin: Secondary | ICD-10-CM | POA: Diagnosis not present

## 2017-07-04 DIAGNOSIS — L919 Hypertrophic disorder of the skin, unspecified: Secondary | ICD-10-CM | POA: Diagnosis not present

## 2017-07-04 DIAGNOSIS — L820 Inflamed seborrheic keratosis: Secondary | ICD-10-CM | POA: Diagnosis not present

## 2017-07-04 DIAGNOSIS — D229 Melanocytic nevi, unspecified: Secondary | ICD-10-CM | POA: Diagnosis not present

## 2017-07-19 DIAGNOSIS — R5383 Other fatigue: Secondary | ICD-10-CM | POA: Diagnosis not present

## 2017-07-19 DIAGNOSIS — Z6827 Body mass index (BMI) 27.0-27.9, adult: Secondary | ICD-10-CM | POA: Diagnosis not present

## 2017-07-19 DIAGNOSIS — F411 Generalized anxiety disorder: Secondary | ICD-10-CM | POA: Diagnosis not present

## 2017-07-19 DIAGNOSIS — C4491 Basal cell carcinoma of skin, unspecified: Secondary | ICD-10-CM | POA: Diagnosis not present

## 2017-07-19 DIAGNOSIS — I10 Essential (primary) hypertension: Secondary | ICD-10-CM | POA: Diagnosis not present

## 2017-07-19 DIAGNOSIS — G4700 Insomnia, unspecified: Secondary | ICD-10-CM | POA: Diagnosis not present

## 2017-07-19 DIAGNOSIS — M170 Bilateral primary osteoarthritis of knee: Secondary | ICD-10-CM | POA: Diagnosis not present

## 2017-08-02 DIAGNOSIS — E876 Hypokalemia: Secondary | ICD-10-CM | POA: Diagnosis not present

## 2017-08-02 DIAGNOSIS — R5383 Other fatigue: Secondary | ICD-10-CM | POA: Diagnosis not present

## 2017-08-02 DIAGNOSIS — I10 Essential (primary) hypertension: Secondary | ICD-10-CM | POA: Diagnosis not present

## 2017-08-02 DIAGNOSIS — E871 Hypo-osmolality and hyponatremia: Secondary | ICD-10-CM | POA: Diagnosis not present

## 2017-08-02 DIAGNOSIS — Z6827 Body mass index (BMI) 27.0-27.9, adult: Secondary | ICD-10-CM | POA: Diagnosis not present

## 2017-08-11 ENCOUNTER — Other Ambulatory Visit: Payer: Self-pay

## 2017-08-30 ENCOUNTER — Ambulatory Visit
Admission: RE | Admit: 2017-08-30 | Discharge: 2017-08-30 | Disposition: A | Discharge status: Home / Self Care | Payer: Medicare Other | Source: Ambulatory Visit | Attending: Family Medicine | Admitting: Family Medicine

## 2017-08-30 ENCOUNTER — Other Ambulatory Visit: Payer: Self-pay | Admitting: Family Medicine

## 2017-08-30 DIAGNOSIS — M25561 Pain in right knee: Secondary | ICD-10-CM | POA: Diagnosis not present

## 2017-08-30 DIAGNOSIS — M1712 Unilateral primary osteoarthritis, left knee: Secondary | ICD-10-CM

## 2017-08-30 DIAGNOSIS — M25562 Pain in left knee: Secondary | ICD-10-CM | POA: Diagnosis not present

## 2017-08-30 DIAGNOSIS — M1711 Unilateral primary osteoarthritis, right knee: Secondary | ICD-10-CM

## 2017-08-30 DIAGNOSIS — Z6828 Body mass index (BMI) 28.0-28.9, adult: Secondary | ICD-10-CM | POA: Diagnosis not present

## 2017-09-07 DIAGNOSIS — Z6828 Body mass index (BMI) 28.0-28.9, adult: Secondary | ICD-10-CM | POA: Diagnosis not present

## 2017-09-07 DIAGNOSIS — R55 Syncope and collapse: Secondary | ICD-10-CM | POA: Diagnosis not present

## 2017-09-07 DIAGNOSIS — M1712 Unilateral primary osteoarthritis, left knee: Secondary | ICD-10-CM | POA: Diagnosis not present

## 2017-09-07 DIAGNOSIS — H6123 Impacted cerumen, bilateral: Secondary | ICD-10-CM | POA: Diagnosis not present

## 2017-09-07 DIAGNOSIS — I10 Essential (primary) hypertension: Secondary | ICD-10-CM | POA: Diagnosis not present

## 2017-09-07 DIAGNOSIS — R42 Dizziness and giddiness: Secondary | ICD-10-CM | POA: Diagnosis not present

## 2017-09-09 DIAGNOSIS — I10 Essential (primary) hypertension: Secondary | ICD-10-CM | POA: Diagnosis not present

## 2017-09-09 DIAGNOSIS — Z6827 Body mass index (BMI) 27.0-27.9, adult: Secondary | ICD-10-CM | POA: Diagnosis not present

## 2017-09-09 DIAGNOSIS — M170 Bilateral primary osteoarthritis of knee: Secondary | ICD-10-CM | POA: Diagnosis not present

## 2017-09-09 DIAGNOSIS — R55 Syncope and collapse: Secondary | ICD-10-CM | POA: Diagnosis not present

## 2017-09-09 DIAGNOSIS — R0602 Shortness of breath: Secondary | ICD-10-CM | POA: Diagnosis not present

## 2017-09-09 DIAGNOSIS — R61 Generalized hyperhidrosis: Secondary | ICD-10-CM | POA: Diagnosis not present

## 2017-09-09 DIAGNOSIS — Z0000 Encounter for general adult medical examination without abnormal findings: Secondary | ICD-10-CM | POA: Diagnosis not present

## 2017-09-14 DIAGNOSIS — M1712 Unilateral primary osteoarthritis, left knee: Secondary | ICD-10-CM | POA: Diagnosis not present

## 2017-09-14 DIAGNOSIS — R61 Generalized hyperhidrosis: Secondary | ICD-10-CM | POA: Diagnosis not present

## 2017-09-14 DIAGNOSIS — I10 Essential (primary) hypertension: Secondary | ICD-10-CM | POA: Diagnosis not present

## 2017-09-14 DIAGNOSIS — R55 Syncope and collapse: Secondary | ICD-10-CM | POA: Diagnosis not present

## 2017-09-14 DIAGNOSIS — Z6826 Body mass index (BMI) 26.0-26.9, adult: Secondary | ICD-10-CM | POA: Diagnosis not present

## 2017-09-15 DIAGNOSIS — I10 Essential (primary) hypertension: Secondary | ICD-10-CM | POA: Diagnosis not present

## 2017-09-15 DIAGNOSIS — M1712 Unilateral primary osteoarthritis, left knee: Secondary | ICD-10-CM | POA: Diagnosis not present

## 2017-09-15 DIAGNOSIS — F329 Major depressive disorder, single episode, unspecified: Secondary | ICD-10-CM | POA: Diagnosis not present

## 2017-09-15 DIAGNOSIS — M6281 Muscle weakness (generalized): Secondary | ICD-10-CM | POA: Diagnosis not present

## 2017-09-15 DIAGNOSIS — R262 Difficulty in walking, not elsewhere classified: Secondary | ICD-10-CM | POA: Diagnosis not present

## 2017-09-16 DIAGNOSIS — M6281 Muscle weakness (generalized): Secondary | ICD-10-CM | POA: Diagnosis not present

## 2017-09-16 DIAGNOSIS — I10 Essential (primary) hypertension: Secondary | ICD-10-CM | POA: Diagnosis not present

## 2017-09-16 DIAGNOSIS — M1712 Unilateral primary osteoarthritis, left knee: Secondary | ICD-10-CM | POA: Diagnosis not present

## 2017-09-16 DIAGNOSIS — R262 Difficulty in walking, not elsewhere classified: Secondary | ICD-10-CM | POA: Diagnosis not present

## 2017-09-16 DIAGNOSIS — F329 Major depressive disorder, single episode, unspecified: Secondary | ICD-10-CM | POA: Diagnosis not present

## 2017-09-19 DIAGNOSIS — M1712 Unilateral primary osteoarthritis, left knee: Secondary | ICD-10-CM | POA: Diagnosis not present

## 2017-09-19 DIAGNOSIS — M6281 Muscle weakness (generalized): Secondary | ICD-10-CM | POA: Diagnosis not present

## 2017-09-19 DIAGNOSIS — I10 Essential (primary) hypertension: Secondary | ICD-10-CM | POA: Diagnosis not present

## 2017-09-19 DIAGNOSIS — F329 Major depressive disorder, single episode, unspecified: Secondary | ICD-10-CM | POA: Diagnosis not present

## 2017-09-19 DIAGNOSIS — R262 Difficulty in walking, not elsewhere classified: Secondary | ICD-10-CM | POA: Diagnosis not present

## 2017-09-21 DIAGNOSIS — I10 Essential (primary) hypertension: Secondary | ICD-10-CM | POA: Diagnosis not present

## 2017-09-21 DIAGNOSIS — M1712 Unilateral primary osteoarthritis, left knee: Secondary | ICD-10-CM | POA: Diagnosis not present

## 2017-09-21 DIAGNOSIS — R262 Difficulty in walking, not elsewhere classified: Secondary | ICD-10-CM | POA: Diagnosis not present

## 2017-09-21 DIAGNOSIS — M189 Osteoarthritis of first carpometacarpal joint, unspecified: Secondary | ICD-10-CM | POA: Diagnosis not present

## 2017-09-21 DIAGNOSIS — M6281 Muscle weakness (generalized): Secondary | ICD-10-CM | POA: Diagnosis not present

## 2017-09-21 DIAGNOSIS — F329 Major depressive disorder, single episode, unspecified: Secondary | ICD-10-CM | POA: Diagnosis not present

## 2017-09-21 DIAGNOSIS — Z6826 Body mass index (BMI) 26.0-26.9, adult: Secondary | ICD-10-CM | POA: Diagnosis not present

## 2017-09-22 DIAGNOSIS — F329 Major depressive disorder, single episode, unspecified: Secondary | ICD-10-CM | POA: Diagnosis not present

## 2017-09-22 DIAGNOSIS — M6281 Muscle weakness (generalized): Secondary | ICD-10-CM | POA: Diagnosis not present

## 2017-09-22 DIAGNOSIS — M1712 Unilateral primary osteoarthritis, left knee: Secondary | ICD-10-CM | POA: Diagnosis not present

## 2017-09-22 DIAGNOSIS — R262 Difficulty in walking, not elsewhere classified: Secondary | ICD-10-CM | POA: Diagnosis not present

## 2017-09-22 DIAGNOSIS — I10 Essential (primary) hypertension: Secondary | ICD-10-CM | POA: Diagnosis not present

## 2017-09-23 DIAGNOSIS — M6281 Muscle weakness (generalized): Secondary | ICD-10-CM | POA: Diagnosis not present

## 2017-09-23 DIAGNOSIS — M1712 Unilateral primary osteoarthritis, left knee: Secondary | ICD-10-CM | POA: Diagnosis not present

## 2017-09-23 DIAGNOSIS — I10 Essential (primary) hypertension: Secondary | ICD-10-CM | POA: Diagnosis not present

## 2017-09-23 DIAGNOSIS — R262 Difficulty in walking, not elsewhere classified: Secondary | ICD-10-CM | POA: Diagnosis not present

## 2017-09-23 DIAGNOSIS — F329 Major depressive disorder, single episode, unspecified: Secondary | ICD-10-CM | POA: Diagnosis not present

## 2017-09-27 DIAGNOSIS — I10 Essential (primary) hypertension: Secondary | ICD-10-CM | POA: Diagnosis not present

## 2017-09-27 DIAGNOSIS — R262 Difficulty in walking, not elsewhere classified: Secondary | ICD-10-CM | POA: Diagnosis not present

## 2017-09-27 DIAGNOSIS — M1712 Unilateral primary osteoarthritis, left knee: Secondary | ICD-10-CM | POA: Diagnosis not present

## 2017-09-27 DIAGNOSIS — F329 Major depressive disorder, single episode, unspecified: Secondary | ICD-10-CM | POA: Diagnosis not present

## 2017-09-27 DIAGNOSIS — M6281 Muscle weakness (generalized): Secondary | ICD-10-CM | POA: Diagnosis not present

## 2017-09-28 DIAGNOSIS — I10 Essential (primary) hypertension: Secondary | ICD-10-CM | POA: Diagnosis not present

## 2017-09-28 DIAGNOSIS — F329 Major depressive disorder, single episode, unspecified: Secondary | ICD-10-CM | POA: Diagnosis not present

## 2017-09-28 DIAGNOSIS — R262 Difficulty in walking, not elsewhere classified: Secondary | ICD-10-CM | POA: Diagnosis not present

## 2017-09-28 DIAGNOSIS — M1712 Unilateral primary osteoarthritis, left knee: Secondary | ICD-10-CM | POA: Diagnosis not present

## 2017-09-28 DIAGNOSIS — M6281 Muscle weakness (generalized): Secondary | ICD-10-CM | POA: Diagnosis not present

## 2017-09-29 DIAGNOSIS — F329 Major depressive disorder, single episode, unspecified: Secondary | ICD-10-CM | POA: Diagnosis not present

## 2017-09-29 DIAGNOSIS — I10 Essential (primary) hypertension: Secondary | ICD-10-CM | POA: Diagnosis not present

## 2017-09-29 DIAGNOSIS — M1712 Unilateral primary osteoarthritis, left knee: Secondary | ICD-10-CM | POA: Diagnosis not present

## 2017-09-29 DIAGNOSIS — R262 Difficulty in walking, not elsewhere classified: Secondary | ICD-10-CM | POA: Diagnosis not present

## 2017-09-29 DIAGNOSIS — M6281 Muscle weakness (generalized): Secondary | ICD-10-CM | POA: Diagnosis not present

## 2017-10-05 DIAGNOSIS — I951 Orthostatic hypotension: Secondary | ICD-10-CM | POA: Diagnosis not present

## 2017-10-05 DIAGNOSIS — I10 Essential (primary) hypertension: Secondary | ICD-10-CM | POA: Diagnosis not present

## 2017-10-05 DIAGNOSIS — Z6828 Body mass index (BMI) 28.0-28.9, adult: Secondary | ICD-10-CM | POA: Diagnosis not present

## 2017-10-06 DIAGNOSIS — R262 Difficulty in walking, not elsewhere classified: Secondary | ICD-10-CM | POA: Diagnosis not present

## 2017-10-06 DIAGNOSIS — I10 Essential (primary) hypertension: Secondary | ICD-10-CM | POA: Diagnosis not present

## 2017-10-06 DIAGNOSIS — F329 Major depressive disorder, single episode, unspecified: Secondary | ICD-10-CM | POA: Diagnosis not present

## 2017-10-06 DIAGNOSIS — M6281 Muscle weakness (generalized): Secondary | ICD-10-CM | POA: Diagnosis not present

## 2017-10-06 DIAGNOSIS — M1712 Unilateral primary osteoarthritis, left knee: Secondary | ICD-10-CM | POA: Diagnosis not present

## 2017-10-12 DIAGNOSIS — M6281 Muscle weakness (generalized): Secondary | ICD-10-CM | POA: Diagnosis not present

## 2017-10-12 DIAGNOSIS — I10 Essential (primary) hypertension: Secondary | ICD-10-CM | POA: Diagnosis not present

## 2017-10-12 DIAGNOSIS — F329 Major depressive disorder, single episode, unspecified: Secondary | ICD-10-CM | POA: Diagnosis not present

## 2017-10-12 DIAGNOSIS — R262 Difficulty in walking, not elsewhere classified: Secondary | ICD-10-CM | POA: Diagnosis not present

## 2017-10-12 DIAGNOSIS — M1712 Unilateral primary osteoarthritis, left knee: Secondary | ICD-10-CM | POA: Diagnosis not present

## 2017-10-14 DIAGNOSIS — R42 Dizziness and giddiness: Secondary | ICD-10-CM | POA: Diagnosis not present

## 2017-10-14 DIAGNOSIS — I10 Essential (primary) hypertension: Secondary | ICD-10-CM | POA: Diagnosis not present

## 2017-10-14 DIAGNOSIS — Z23 Encounter for immunization: Secondary | ICD-10-CM | POA: Diagnosis not present

## 2017-10-14 DIAGNOSIS — Z6827 Body mass index (BMI) 27.0-27.9, adult: Secondary | ICD-10-CM | POA: Diagnosis not present

## 2017-11-04 DIAGNOSIS — I10 Essential (primary) hypertension: Secondary | ICD-10-CM | POA: Diagnosis not present

## 2017-11-04 DIAGNOSIS — M810 Age-related osteoporosis without current pathological fracture: Secondary | ICD-10-CM | POA: Diagnosis not present

## 2017-11-04 DIAGNOSIS — R55 Syncope and collapse: Secondary | ICD-10-CM | POA: Diagnosis not present

## 2017-11-04 DIAGNOSIS — R42 Dizziness and giddiness: Secondary | ICD-10-CM | POA: Diagnosis not present

## 2017-11-04 DIAGNOSIS — Z6827 Body mass index (BMI) 27.0-27.9, adult: Secondary | ICD-10-CM | POA: Diagnosis not present

## 2017-12-21 DIAGNOSIS — M1712 Unilateral primary osteoarthritis, left knee: Secondary | ICD-10-CM | POA: Diagnosis not present

## 2017-12-21 DIAGNOSIS — I10 Essential (primary) hypertension: Secondary | ICD-10-CM | POA: Diagnosis not present

## 2017-12-21 DIAGNOSIS — G4700 Insomnia, unspecified: Secondary | ICD-10-CM | POA: Diagnosis not present

## 2017-12-21 DIAGNOSIS — M170 Bilateral primary osteoarthritis of knee: Secondary | ICD-10-CM | POA: Diagnosis not present

## 2018-01-25 DIAGNOSIS — Z6827 Body mass index (BMI) 27.0-27.9, adult: Secondary | ICD-10-CM | POA: Diagnosis not present

## 2018-01-25 DIAGNOSIS — R5383 Other fatigue: Secondary | ICD-10-CM | POA: Diagnosis not present

## 2018-01-25 DIAGNOSIS — M170 Bilateral primary osteoarthritis of knee: Secondary | ICD-10-CM | POA: Diagnosis not present

## 2018-01-25 DIAGNOSIS — I10 Essential (primary) hypertension: Secondary | ICD-10-CM | POA: Diagnosis not present

## 2018-02-01 DIAGNOSIS — I10 Essential (primary) hypertension: Secondary | ICD-10-CM | POA: Diagnosis not present

## 2018-02-01 DIAGNOSIS — Z6827 Body mass index (BMI) 27.0-27.9, adult: Secondary | ICD-10-CM | POA: Diagnosis not present

## 2018-02-01 DIAGNOSIS — R001 Bradycardia, unspecified: Secondary | ICD-10-CM | POA: Diagnosis not present

## 2018-02-01 DIAGNOSIS — R5383 Other fatigue: Secondary | ICD-10-CM | POA: Diagnosis not present

## 2018-02-15 DIAGNOSIS — Z6827 Body mass index (BMI) 27.0-27.9, adult: Secondary | ICD-10-CM | POA: Diagnosis not present

## 2018-02-15 DIAGNOSIS — R42 Dizziness and giddiness: Secondary | ICD-10-CM | POA: Diagnosis not present

## 2018-02-15 DIAGNOSIS — R5383 Other fatigue: Secondary | ICD-10-CM | POA: Diagnosis not present

## 2018-02-15 DIAGNOSIS — I10 Essential (primary) hypertension: Secondary | ICD-10-CM | POA: Diagnosis not present

## 2018-03-15 DIAGNOSIS — I10 Essential (primary) hypertension: Secondary | ICD-10-CM | POA: Diagnosis not present

## 2018-03-15 DIAGNOSIS — R5383 Other fatigue: Secondary | ICD-10-CM | POA: Diagnosis not present

## 2018-03-15 DIAGNOSIS — Z6827 Body mass index (BMI) 27.0-27.9, adult: Secondary | ICD-10-CM | POA: Diagnosis not present

## 2018-03-15 DIAGNOSIS — M170 Bilateral primary osteoarthritis of knee: Secondary | ICD-10-CM | POA: Diagnosis not present

## 2018-04-12 DIAGNOSIS — M170 Bilateral primary osteoarthritis of knee: Secondary | ICD-10-CM | POA: Diagnosis not present

## 2018-04-12 DIAGNOSIS — Z719 Counseling, unspecified: Secondary | ICD-10-CM | POA: Diagnosis not present

## 2018-04-12 DIAGNOSIS — I10 Essential (primary) hypertension: Secondary | ICD-10-CM | POA: Diagnosis not present

## 2018-05-24 DIAGNOSIS — M170 Bilateral primary osteoarthritis of knee: Secondary | ICD-10-CM | POA: Diagnosis not present

## 2018-05-24 DIAGNOSIS — M542 Cervicalgia: Secondary | ICD-10-CM | POA: Diagnosis not present

## 2018-05-24 DIAGNOSIS — I10 Essential (primary) hypertension: Secondary | ICD-10-CM | POA: Diagnosis not present

## 2018-05-31 DIAGNOSIS — M1712 Unilateral primary osteoarthritis, left knee: Secondary | ICD-10-CM | POA: Diagnosis not present

## 2018-06-07 DIAGNOSIS — M1712 Unilateral primary osteoarthritis, left knee: Secondary | ICD-10-CM | POA: Diagnosis not present

## 2018-07-25 ENCOUNTER — Encounter: Payer: Self-pay | Admitting: *Deleted

## 2018-09-11 DIAGNOSIS — M1712 Unilateral primary osteoarthritis, left knee: Secondary | ICD-10-CM | POA: Diagnosis not present

## 2018-09-11 DIAGNOSIS — I10 Essential (primary) hypertension: Secondary | ICD-10-CM | POA: Diagnosis not present

## 2018-09-11 DIAGNOSIS — G4700 Insomnia, unspecified: Secondary | ICD-10-CM | POA: Diagnosis not present

## 2018-09-11 DIAGNOSIS — M170 Bilateral primary osteoarthritis of knee: Secondary | ICD-10-CM | POA: Diagnosis not present

## 2018-10-11 DIAGNOSIS — G4700 Insomnia, unspecified: Secondary | ICD-10-CM | POA: Diagnosis not present

## 2018-10-11 DIAGNOSIS — I10 Essential (primary) hypertension: Secondary | ICD-10-CM | POA: Diagnosis not present

## 2018-10-11 DIAGNOSIS — M1712 Unilateral primary osteoarthritis, left knee: Secondary | ICD-10-CM | POA: Diagnosis not present

## 2018-11-06 DIAGNOSIS — M810 Age-related osteoporosis without current pathological fracture: Secondary | ICD-10-CM | POA: Diagnosis not present

## 2018-11-06 DIAGNOSIS — J309 Allergic rhinitis, unspecified: Secondary | ICD-10-CM | POA: Diagnosis not present

## 2018-11-06 DIAGNOSIS — M170 Bilateral primary osteoarthritis of knee: Secondary | ICD-10-CM | POA: Diagnosis not present

## 2018-11-06 DIAGNOSIS — I10 Essential (primary) hypertension: Secondary | ICD-10-CM | POA: Diagnosis not present

## 2018-11-06 DIAGNOSIS — R001 Bradycardia, unspecified: Secondary | ICD-10-CM | POA: Diagnosis not present

## 2018-11-06 DIAGNOSIS — G4700 Insomnia, unspecified: Secondary | ICD-10-CM | POA: Diagnosis not present

## 2018-12-04 ENCOUNTER — Other Ambulatory Visit: Payer: Self-pay

## 2018-12-11 DIAGNOSIS — M170 Bilateral primary osteoarthritis of knee: Secondary | ICD-10-CM | POA: Diagnosis not present

## 2018-12-11 DIAGNOSIS — Z0000 Encounter for general adult medical examination without abnormal findings: Secondary | ICD-10-CM | POA: Diagnosis not present

## 2018-12-11 DIAGNOSIS — J309 Allergic rhinitis, unspecified: Secondary | ICD-10-CM | POA: Diagnosis not present

## 2018-12-11 DIAGNOSIS — G4700 Insomnia, unspecified: Secondary | ICD-10-CM | POA: Diagnosis not present

## 2018-12-14 DIAGNOSIS — M1712 Unilateral primary osteoarthritis, left knee: Secondary | ICD-10-CM | POA: Diagnosis not present

## 2018-12-21 DIAGNOSIS — M1712 Unilateral primary osteoarthritis, left knee: Secondary | ICD-10-CM | POA: Diagnosis not present

## 2019-01-31 ENCOUNTER — Ambulatory Visit: Payer: Medicare Other | Attending: Internal Medicine

## 2019-01-31 DIAGNOSIS — Z23 Encounter for immunization: Secondary | ICD-10-CM | POA: Insufficient documentation

## 2019-01-31 NOTE — Progress Notes (Signed)
   Covid-19 Vaccination Clinic  Name:  [name]    MRN: [id number] DOB: [birth year]  01/31/2019  [name] was observed post Covid-19 immunization for 15 minutes without incidence. She was provided with Vaccine Information Sheet and instruction to access the V-Safe system.   [name] was instructed to call 911 with any severe reactions post vaccine: Marland Kitchen Difficulty breathing  . Swelling of your face and throat  . A fast heartbeat  . A bad rash all over your body  . Dizziness and weakness    Immunizations Administered    Name Date Dose VIS Date Route   Pfizer COVID-19 Vaccine 01/31/2019  1:42 PM 0.3 mL 12/22/2018 Intramuscular   Manufacturer: [place]   Lot: [id number]   [place]: [id number]

## 2019-02-21 ENCOUNTER — Ambulatory Visit: Payer: Medicare Other | Attending: Internal Medicine

## 2019-02-21 DIAGNOSIS — Z23 Encounter for immunization: Secondary | ICD-10-CM | POA: Insufficient documentation

## 2019-02-21 NOTE — Progress Notes (Signed)
   Covid-19 Vaccination Clinic  Name:  Jasimine Gasiewski    MRN: [id number] DOB: [birth year]  02/21/2019  Ms. Dolney was observed post Covid-19 immunization for 15 minutes without incidence. She was provided with Vaccine Information Sheet and instruction to access the V-Safe system.   Ms. Ansell was instructed to call 911 with any severe reactions post vaccine: Marland Kitchen Difficulty breathing  . Swelling of your face and throat  . A fast heartbeat  . A bad rash all over your body  . Dizziness and weakness    Immunizations Administered    Name Date Dose VIS Date Route   Pfizer COVID-19 Vaccine 02/21/2019  2:51 PM 0.3 mL 12/22/2018 Intramuscular   Manufacturer: [place]   Lot: [id number]   [place]: [id number]

## 2019-05-07 DIAGNOSIS — R001 Bradycardia, unspecified: Secondary | ICD-10-CM | POA: Diagnosis not present

## 2019-05-07 DIAGNOSIS — I10 Essential (primary) hypertension: Secondary | ICD-10-CM | POA: Diagnosis not present

## 2019-05-07 DIAGNOSIS — G4700 Insomnia, unspecified: Secondary | ICD-10-CM | POA: Diagnosis not present

## 2019-05-07 DIAGNOSIS — E782 Mixed hyperlipidemia: Secondary | ICD-10-CM | POA: Diagnosis not present

## 2019-05-11 DIAGNOSIS — E079 Disorder of thyroid, unspecified: Secondary | ICD-10-CM | POA: Diagnosis not present

## 2019-05-11 DIAGNOSIS — E782 Mixed hyperlipidemia: Secondary | ICD-10-CM | POA: Diagnosis not present

## 2019-05-11 DIAGNOSIS — I10 Essential (primary) hypertension: Secondary | ICD-10-CM | POA: Diagnosis not present

## 2019-05-16 DIAGNOSIS — E876 Hypokalemia: Secondary | ICD-10-CM | POA: Diagnosis not present

## 2019-05-16 DIAGNOSIS — I10 Essential (primary) hypertension: Secondary | ICD-10-CM | POA: Diagnosis not present

## 2019-05-16 DIAGNOSIS — M170 Bilateral primary osteoarthritis of knee: Secondary | ICD-10-CM | POA: Diagnosis not present

## 2019-05-16 DIAGNOSIS — E875 Hyperkalemia: Secondary | ICD-10-CM | POA: Diagnosis not present

## 2019-05-16 DIAGNOSIS — M1712 Unilateral primary osteoarthritis, left knee: Secondary | ICD-10-CM | POA: Diagnosis not present

## 2019-05-16 DIAGNOSIS — R739 Hyperglycemia, unspecified: Secondary | ICD-10-CM | POA: Diagnosis not present

## 2019-05-16 DIAGNOSIS — E782 Mixed hyperlipidemia: Secondary | ICD-10-CM | POA: Diagnosis not present

## 2019-09-26 DIAGNOSIS — E782 Mixed hyperlipidemia: Secondary | ICD-10-CM | POA: Diagnosis not present

## 2019-09-26 DIAGNOSIS — E079 Disorder of thyroid, unspecified: Secondary | ICD-10-CM | POA: Diagnosis not present

## 2019-09-26 DIAGNOSIS — E559 Vitamin D deficiency, unspecified: Secondary | ICD-10-CM | POA: Diagnosis not present

## 2019-09-26 DIAGNOSIS — I10 Essential (primary) hypertension: Secondary | ICD-10-CM | POA: Diagnosis not present

## 2019-10-01 DIAGNOSIS — E782 Mixed hyperlipidemia: Secondary | ICD-10-CM | POA: Diagnosis not present

## 2019-10-01 DIAGNOSIS — Z1339 Encounter for screening examination for other mental health and behavioral disorders: Secondary | ICD-10-CM | POA: Diagnosis not present

## 2019-10-01 DIAGNOSIS — Z0000 Encounter for general adult medical examination without abnormal findings: Secondary | ICD-10-CM | POA: Diagnosis not present

## 2019-10-01 DIAGNOSIS — R7309 Other abnormal glucose: Secondary | ICD-10-CM | POA: Diagnosis not present

## 2019-10-01 DIAGNOSIS — Z1331 Encounter for screening for depression: Secondary | ICD-10-CM | POA: Diagnosis not present

## 2019-10-01 DIAGNOSIS — R61 Generalized hyperhidrosis: Secondary | ICD-10-CM | POA: Diagnosis not present

## 2019-10-01 DIAGNOSIS — M1990 Unspecified osteoarthritis, unspecified site: Secondary | ICD-10-CM | POA: Diagnosis not present

## 2019-10-01 DIAGNOSIS — G4700 Insomnia, unspecified: Secondary | ICD-10-CM | POA: Diagnosis not present

## 2019-10-01 DIAGNOSIS — E559 Vitamin D deficiency, unspecified: Secondary | ICD-10-CM | POA: Diagnosis not present

## 2019-10-01 DIAGNOSIS — I10 Essential (primary) hypertension: Secondary | ICD-10-CM | POA: Diagnosis not present

## 2019-10-10 DIAGNOSIS — Z23 Encounter for immunization: Secondary | ICD-10-CM | POA: Diagnosis not present

## 2019-10-29 DIAGNOSIS — R739 Hyperglycemia, unspecified: Secondary | ICD-10-CM | POA: Diagnosis not present

## 2019-11-05 DIAGNOSIS — R61 Generalized hyperhidrosis: Secondary | ICD-10-CM | POA: Diagnosis not present

## 2019-11-05 DIAGNOSIS — I10 Essential (primary) hypertension: Secondary | ICD-10-CM | POA: Diagnosis not present

## 2019-11-05 DIAGNOSIS — M170 Bilateral primary osteoarthritis of knee: Secondary | ICD-10-CM | POA: Diagnosis not present

## 2019-11-05 DIAGNOSIS — G4700 Insomnia, unspecified: Secondary | ICD-10-CM | POA: Diagnosis not present

## 2019-11-05 DIAGNOSIS — R7309 Other abnormal glucose: Secondary | ICD-10-CM | POA: Diagnosis not present

## 2019-11-05 DIAGNOSIS — M19049 Primary osteoarthritis, unspecified hand: Secondary | ICD-10-CM | POA: Diagnosis not present

## 2019-11-13 DIAGNOSIS — M1712 Unilateral primary osteoarthritis, left knee: Secondary | ICD-10-CM | POA: Diagnosis not present

## 2019-11-20 DIAGNOSIS — M1712 Unilateral primary osteoarthritis, left knee: Secondary | ICD-10-CM | POA: Diagnosis not present

## 2019-11-27 DIAGNOSIS — M1712 Unilateral primary osteoarthritis, left knee: Secondary | ICD-10-CM | POA: Diagnosis not present

## 2019-12-04 DIAGNOSIS — M1712 Unilateral primary osteoarthritis, left knee: Secondary | ICD-10-CM | POA: Diagnosis not present

## 2020-02-04 DIAGNOSIS — E782 Mixed hyperlipidemia: Secondary | ICD-10-CM | POA: Diagnosis not present

## 2020-02-04 DIAGNOSIS — G4700 Insomnia, unspecified: Secondary | ICD-10-CM | POA: Diagnosis not present

## 2020-02-04 DIAGNOSIS — M1712 Unilateral primary osteoarthritis, left knee: Secondary | ICD-10-CM | POA: Diagnosis not present

## 2020-02-04 DIAGNOSIS — I10 Essential (primary) hypertension: Secondary | ICD-10-CM | POA: Diagnosis not present

## 2020-02-04 DIAGNOSIS — E1165 Type 2 diabetes mellitus with hyperglycemia: Secondary | ICD-10-CM | POA: Diagnosis not present

## 2020-02-12 DIAGNOSIS — I10 Essential (primary) hypertension: Secondary | ICD-10-CM | POA: Diagnosis not present

## 2020-02-12 DIAGNOSIS — R7309 Other abnormal glucose: Secondary | ICD-10-CM | POA: Diagnosis not present

## 2020-02-12 DIAGNOSIS — R739 Hyperglycemia, unspecified: Secondary | ICD-10-CM | POA: Diagnosis not present

## 2020-02-12 DIAGNOSIS — M1712 Unilateral primary osteoarthritis, left knee: Secondary | ICD-10-CM | POA: Diagnosis not present

## 2020-05-13 DIAGNOSIS — G4700 Insomnia, unspecified: Secondary | ICD-10-CM | POA: Diagnosis not present

## 2020-05-13 DIAGNOSIS — M1711 Unilateral primary osteoarthritis, right knee: Secondary | ICD-10-CM | POA: Diagnosis not present

## 2020-05-13 DIAGNOSIS — I10 Essential (primary) hypertension: Secondary | ICD-10-CM | POA: Diagnosis not present

## 2020-05-13 DIAGNOSIS — M1712 Unilateral primary osteoarthritis, left knee: Secondary | ICD-10-CM | POA: Diagnosis not present

## 2020-05-13 DIAGNOSIS — M170 Bilateral primary osteoarthritis of knee: Secondary | ICD-10-CM | POA: Diagnosis not present

## 2020-05-13 DIAGNOSIS — Z23 Encounter for immunization: Secondary | ICD-10-CM | POA: Diagnosis not present

## 2020-05-21 DIAGNOSIS — R61 Generalized hyperhidrosis: Secondary | ICD-10-CM | POA: Diagnosis not present

## 2020-05-21 DIAGNOSIS — G4700 Insomnia, unspecified: Secondary | ICD-10-CM | POA: Diagnosis not present

## 2020-05-21 DIAGNOSIS — M1990 Unspecified osteoarthritis, unspecified site: Secondary | ICD-10-CM | POA: Diagnosis not present

## 2020-05-21 DIAGNOSIS — M1712 Unilateral primary osteoarthritis, left knee: Secondary | ICD-10-CM | POA: Diagnosis not present

## 2020-05-28 DIAGNOSIS — M1712 Unilateral primary osteoarthritis, left knee: Secondary | ICD-10-CM | POA: Diagnosis not present

## 2020-06-04 DIAGNOSIS — M1712 Unilateral primary osteoarthritis, left knee: Secondary | ICD-10-CM | POA: Diagnosis not present

## 2020-06-04 DIAGNOSIS — Z872 Personal history of diseases of the skin and subcutaneous tissue: Secondary | ICD-10-CM | POA: Diagnosis not present

## 2020-06-12 DIAGNOSIS — N390 Urinary tract infection, site not specified: Secondary | ICD-10-CM | POA: Diagnosis not present

## 2020-06-12 DIAGNOSIS — R5383 Other fatigue: Secondary | ICD-10-CM | POA: Diagnosis not present

## 2020-06-12 DIAGNOSIS — I10 Essential (primary) hypertension: Secondary | ICD-10-CM | POA: Diagnosis not present

## 2020-06-13 DIAGNOSIS — R5383 Other fatigue: Secondary | ICD-10-CM | POA: Diagnosis not present

## 2020-06-13 DIAGNOSIS — I10 Essential (primary) hypertension: Secondary | ICD-10-CM | POA: Diagnosis not present

## 2020-06-19 DIAGNOSIS — R5383 Other fatigue: Secondary | ICD-10-CM | POA: Diagnosis not present

## 2020-06-19 DIAGNOSIS — I10 Essential (primary) hypertension: Secondary | ICD-10-CM | POA: Diagnosis not present

## 2020-06-19 DIAGNOSIS — R109 Unspecified abdominal pain: Secondary | ICD-10-CM | POA: Diagnosis not present

## 2020-06-19 DIAGNOSIS — L749 Eccrine sweat disorder, unspecified: Secondary | ICD-10-CM | POA: Diagnosis not present

## 2020-06-20 ENCOUNTER — Other Ambulatory Visit: Payer: Self-pay | Admitting: Family Medicine

## 2020-06-20 DIAGNOSIS — R5383 Other fatigue: Secondary | ICD-10-CM | POA: Diagnosis not present

## 2020-06-20 DIAGNOSIS — R109 Unspecified abdominal pain: Secondary | ICD-10-CM

## 2020-07-03 DIAGNOSIS — I10 Essential (primary) hypertension: Secondary | ICD-10-CM | POA: Diagnosis not present

## 2020-07-03 DIAGNOSIS — R109 Unspecified abdominal pain: Secondary | ICD-10-CM | POA: Diagnosis not present

## 2020-07-03 DIAGNOSIS — L749 Eccrine sweat disorder, unspecified: Secondary | ICD-10-CM | POA: Diagnosis not present

## 2020-07-03 DIAGNOSIS — R5383 Other fatigue: Secondary | ICD-10-CM | POA: Diagnosis not present

## 2020-07-03 DIAGNOSIS — M1990 Unspecified osteoarthritis, unspecified site: Secondary | ICD-10-CM | POA: Diagnosis not present

## 2020-09-03 DIAGNOSIS — K5900 Constipation, unspecified: Secondary | ICD-10-CM | POA: Diagnosis not present

## 2020-09-03 DIAGNOSIS — F411 Generalized anxiety disorder: Secondary | ICD-10-CM | POA: Diagnosis not present

## 2020-09-03 DIAGNOSIS — I10 Essential (primary) hypertension: Secondary | ICD-10-CM | POA: Diagnosis not present

## 2020-09-03 DIAGNOSIS — M170 Bilateral primary osteoarthritis of knee: Secondary | ICD-10-CM | POA: Diagnosis not present

## 2020-09-03 DIAGNOSIS — Z23 Encounter for immunization: Secondary | ICD-10-CM | POA: Diagnosis not present

## 2020-09-03 DIAGNOSIS — Z7185 Encounter for immunization safety counseling: Secondary | ICD-10-CM | POA: Diagnosis not present

## 2020-09-03 DIAGNOSIS — M1712 Unilateral primary osteoarthritis, left knee: Secondary | ICD-10-CM | POA: Diagnosis not present

## 2020-09-03 DIAGNOSIS — L309 Dermatitis, unspecified: Secondary | ICD-10-CM | POA: Diagnosis not present

## 2020-10-02 DIAGNOSIS — H1131 Conjunctival hemorrhage, right eye: Secondary | ICD-10-CM | POA: Diagnosis not present

## 2020-10-02 DIAGNOSIS — M1990 Unspecified osteoarthritis, unspecified site: Secondary | ICD-10-CM | POA: Diagnosis not present

## 2020-10-02 DIAGNOSIS — Z1331 Encounter for screening for depression: Secondary | ICD-10-CM | POA: Diagnosis not present

## 2020-10-02 DIAGNOSIS — Z1339 Encounter for screening examination for other mental health and behavioral disorders: Secondary | ICD-10-CM | POA: Diagnosis not present

## 2020-10-02 DIAGNOSIS — Z23 Encounter for immunization: Secondary | ICD-10-CM | POA: Diagnosis not present

## 2020-10-02 DIAGNOSIS — G4700 Insomnia, unspecified: Secondary | ICD-10-CM | POA: Diagnosis not present

## 2020-10-02 DIAGNOSIS — Z0000 Encounter for general adult medical examination without abnormal findings: Secondary | ICD-10-CM | POA: Diagnosis not present

## 2020-11-11 ENCOUNTER — Other Ambulatory Visit: Payer: Self-pay | Admitting: Family Medicine

## 2020-11-11 DIAGNOSIS — F331 Major depressive disorder, recurrent, moderate: Secondary | ICD-10-CM | POA: Diagnosis not present

## 2020-11-11 DIAGNOSIS — I10 Essential (primary) hypertension: Secondary | ICD-10-CM | POA: Diagnosis not present

## 2020-11-11 DIAGNOSIS — F411 Generalized anxiety disorder: Secondary | ICD-10-CM | POA: Diagnosis not present

## 2020-11-11 DIAGNOSIS — R55 Syncope and collapse: Secondary | ICD-10-CM

## 2020-11-11 DIAGNOSIS — G4700 Insomnia, unspecified: Secondary | ICD-10-CM | POA: Diagnosis not present

## 2020-11-14 ENCOUNTER — Ambulatory Visit
Admission: RE | Admit: 2020-11-14 | Discharge: 2020-11-14 | Disposition: A | Discharge status: Home / Self Care | Payer: Medicare Other | Source: Ambulatory Visit | Attending: Family Medicine | Admitting: Family Medicine

## 2020-11-14 ENCOUNTER — Inpatient Hospital Stay: Admission: RE | Admit: 2020-11-14 | Payer: PRIVATE HEALTH INSURANCE | Source: Ambulatory Visit

## 2020-11-14 DIAGNOSIS — R55 Syncope and collapse: Secondary | ICD-10-CM | POA: Diagnosis not present

## 2020-11-18 DIAGNOSIS — W19XXXA Unspecified fall, initial encounter: Secondary | ICD-10-CM | POA: Diagnosis not present

## 2020-11-18 DIAGNOSIS — M170 Bilateral primary osteoarthritis of knee: Secondary | ICD-10-CM | POA: Diagnosis not present

## 2020-11-18 DIAGNOSIS — R55 Syncope and collapse: Secondary | ICD-10-CM | POA: Diagnosis not present

## 2020-11-18 DIAGNOSIS — I10 Essential (primary) hypertension: Secondary | ICD-10-CM | POA: Diagnosis not present

## 2020-11-25 DIAGNOSIS — I10 Essential (primary) hypertension: Secondary | ICD-10-CM | POA: Diagnosis not present

## 2020-11-25 DIAGNOSIS — M170 Bilateral primary osteoarthritis of knee: Secondary | ICD-10-CM | POA: Diagnosis not present

## 2020-12-02 DIAGNOSIS — I10 Essential (primary) hypertension: Secondary | ICD-10-CM | POA: Diagnosis not present

## 2020-12-02 DIAGNOSIS — M170 Bilateral primary osteoarthritis of knee: Secondary | ICD-10-CM | POA: Diagnosis not present

## 2020-12-02 DIAGNOSIS — M810 Age-related osteoporosis without current pathological fracture: Secondary | ICD-10-CM | POA: Diagnosis not present

## 2021-01-06 DIAGNOSIS — G4700 Insomnia, unspecified: Secondary | ICD-10-CM | POA: Diagnosis not present

## 2021-01-06 DIAGNOSIS — I10 Essential (primary) hypertension: Secondary | ICD-10-CM | POA: Diagnosis not present

## 2021-01-06 DIAGNOSIS — M170 Bilateral primary osteoarthritis of knee: Secondary | ICD-10-CM | POA: Diagnosis not present

## 2021-01-19 DIAGNOSIS — F411 Generalized anxiety disorder: Secondary | ICD-10-CM | POA: Diagnosis not present

## 2021-01-19 DIAGNOSIS — F32A Depression, unspecified: Secondary | ICD-10-CM | POA: Diagnosis not present

## 2021-01-19 DIAGNOSIS — G4700 Insomnia, unspecified: Secondary | ICD-10-CM | POA: Diagnosis not present

## 2021-01-19 DIAGNOSIS — M170 Bilateral primary osteoarthritis of knee: Secondary | ICD-10-CM | POA: Diagnosis not present

## 2021-01-19 DIAGNOSIS — I10 Essential (primary) hypertension: Secondary | ICD-10-CM | POA: Diagnosis not present

## 2021-01-19 DIAGNOSIS — Z85828 Personal history of other malignant neoplasm of skin: Secondary | ICD-10-CM | POA: Diagnosis not present

## 2021-01-23 DIAGNOSIS — G4700 Insomnia, unspecified: Secondary | ICD-10-CM | POA: Diagnosis not present

## 2021-01-23 DIAGNOSIS — M170 Bilateral primary osteoarthritis of knee: Secondary | ICD-10-CM | POA: Diagnosis not present

## 2021-01-23 DIAGNOSIS — F32A Depression, unspecified: Secondary | ICD-10-CM | POA: Diagnosis not present

## 2021-01-23 DIAGNOSIS — I10 Essential (primary) hypertension: Secondary | ICD-10-CM | POA: Diagnosis not present

## 2021-01-23 DIAGNOSIS — F411 Generalized anxiety disorder: Secondary | ICD-10-CM | POA: Diagnosis not present

## 2021-01-23 DIAGNOSIS — Z85828 Personal history of other malignant neoplasm of skin: Secondary | ICD-10-CM | POA: Diagnosis not present

## 2021-01-27 DIAGNOSIS — M170 Bilateral primary osteoarthritis of knee: Secondary | ICD-10-CM | POA: Diagnosis not present

## 2021-01-27 DIAGNOSIS — F411 Generalized anxiety disorder: Secondary | ICD-10-CM | POA: Diagnosis not present

## 2021-01-27 DIAGNOSIS — G4700 Insomnia, unspecified: Secondary | ICD-10-CM | POA: Diagnosis not present

## 2021-01-27 DIAGNOSIS — F32A Depression, unspecified: Secondary | ICD-10-CM | POA: Diagnosis not present

## 2021-01-27 DIAGNOSIS — Z85828 Personal history of other malignant neoplasm of skin: Secondary | ICD-10-CM | POA: Diagnosis not present

## 2021-01-27 DIAGNOSIS — I10 Essential (primary) hypertension: Secondary | ICD-10-CM | POA: Diagnosis not present

## 2021-01-30 DIAGNOSIS — I10 Essential (primary) hypertension: Secondary | ICD-10-CM | POA: Diagnosis not present

## 2021-01-30 DIAGNOSIS — G4700 Insomnia, unspecified: Secondary | ICD-10-CM | POA: Diagnosis not present

## 2021-01-30 DIAGNOSIS — F32A Depression, unspecified: Secondary | ICD-10-CM | POA: Diagnosis not present

## 2021-01-30 DIAGNOSIS — M170 Bilateral primary osteoarthritis of knee: Secondary | ICD-10-CM | POA: Diagnosis not present

## 2021-01-30 DIAGNOSIS — Z85828 Personal history of other malignant neoplasm of skin: Secondary | ICD-10-CM | POA: Diagnosis not present

## 2021-01-30 DIAGNOSIS — F411 Generalized anxiety disorder: Secondary | ICD-10-CM | POA: Diagnosis not present

## 2021-02-02 DIAGNOSIS — I10 Essential (primary) hypertension: Secondary | ICD-10-CM | POA: Diagnosis not present

## 2021-02-02 DIAGNOSIS — M170 Bilateral primary osteoarthritis of knee: Secondary | ICD-10-CM | POA: Diagnosis not present

## 2021-02-02 DIAGNOSIS — F32A Depression, unspecified: Secondary | ICD-10-CM | POA: Diagnosis not present

## 2021-02-02 DIAGNOSIS — Z85828 Personal history of other malignant neoplasm of skin: Secondary | ICD-10-CM | POA: Diagnosis not present

## 2021-02-02 DIAGNOSIS — F411 Generalized anxiety disorder: Secondary | ICD-10-CM | POA: Diagnosis not present

## 2021-02-02 DIAGNOSIS — G4700 Insomnia, unspecified: Secondary | ICD-10-CM | POA: Diagnosis not present

## 2021-02-05 DIAGNOSIS — G4700 Insomnia, unspecified: Secondary | ICD-10-CM | POA: Diagnosis not present

## 2021-02-05 DIAGNOSIS — I10 Essential (primary) hypertension: Secondary | ICD-10-CM | POA: Diagnosis not present

## 2021-02-05 DIAGNOSIS — F411 Generalized anxiety disorder: Secondary | ICD-10-CM | POA: Diagnosis not present

## 2021-02-05 DIAGNOSIS — M170 Bilateral primary osteoarthritis of knee: Secondary | ICD-10-CM | POA: Diagnosis not present

## 2021-02-05 DIAGNOSIS — F32A Depression, unspecified: Secondary | ICD-10-CM | POA: Diagnosis not present

## 2021-02-05 DIAGNOSIS — Z85828 Personal history of other malignant neoplasm of skin: Secondary | ICD-10-CM | POA: Diagnosis not present

## 2021-02-09 DIAGNOSIS — F411 Generalized anxiety disorder: Secondary | ICD-10-CM | POA: Diagnosis not present

## 2021-02-09 DIAGNOSIS — Z85828 Personal history of other malignant neoplasm of skin: Secondary | ICD-10-CM | POA: Diagnosis not present

## 2021-02-09 DIAGNOSIS — G4700 Insomnia, unspecified: Secondary | ICD-10-CM | POA: Diagnosis not present

## 2021-02-09 DIAGNOSIS — F32A Depression, unspecified: Secondary | ICD-10-CM | POA: Diagnosis not present

## 2021-02-09 DIAGNOSIS — M170 Bilateral primary osteoarthritis of knee: Secondary | ICD-10-CM | POA: Diagnosis not present

## 2021-02-09 DIAGNOSIS — I10 Essential (primary) hypertension: Secondary | ICD-10-CM | POA: Diagnosis not present

## 2021-02-16 DIAGNOSIS — M170 Bilateral primary osteoarthritis of knee: Secondary | ICD-10-CM | POA: Diagnosis not present

## 2021-02-16 DIAGNOSIS — I10 Essential (primary) hypertension: Secondary | ICD-10-CM | POA: Diagnosis not present

## 2021-02-16 DIAGNOSIS — G4700 Insomnia, unspecified: Secondary | ICD-10-CM | POA: Diagnosis not present

## 2021-02-18 DIAGNOSIS — I10 Essential (primary) hypertension: Secondary | ICD-10-CM | POA: Diagnosis not present

## 2021-02-18 DIAGNOSIS — F32A Depression, unspecified: Secondary | ICD-10-CM | POA: Diagnosis not present

## 2021-02-18 DIAGNOSIS — G4700 Insomnia, unspecified: Secondary | ICD-10-CM | POA: Diagnosis not present

## 2021-02-18 DIAGNOSIS — Z85828 Personal history of other malignant neoplasm of skin: Secondary | ICD-10-CM | POA: Diagnosis not present

## 2021-02-18 DIAGNOSIS — F411 Generalized anxiety disorder: Secondary | ICD-10-CM | POA: Diagnosis not present

## 2021-02-18 DIAGNOSIS — M170 Bilateral primary osteoarthritis of knee: Secondary | ICD-10-CM | POA: Diagnosis not present

## 2021-02-23 DIAGNOSIS — Z85828 Personal history of other malignant neoplasm of skin: Secondary | ICD-10-CM | POA: Diagnosis not present

## 2021-02-23 DIAGNOSIS — G4700 Insomnia, unspecified: Secondary | ICD-10-CM | POA: Diagnosis not present

## 2021-02-23 DIAGNOSIS — M170 Bilateral primary osteoarthritis of knee: Secondary | ICD-10-CM | POA: Diagnosis not present

## 2021-02-23 DIAGNOSIS — F411 Generalized anxiety disorder: Secondary | ICD-10-CM | POA: Diagnosis not present

## 2021-02-23 DIAGNOSIS — I10 Essential (primary) hypertension: Secondary | ICD-10-CM | POA: Diagnosis not present

## 2021-02-23 DIAGNOSIS — F32A Depression, unspecified: Secondary | ICD-10-CM | POA: Diagnosis not present

## 2021-03-02 DIAGNOSIS — I10 Essential (primary) hypertension: Secondary | ICD-10-CM | POA: Diagnosis not present

## 2021-03-02 DIAGNOSIS — G4700 Insomnia, unspecified: Secondary | ICD-10-CM | POA: Diagnosis not present

## 2021-03-02 DIAGNOSIS — F32A Depression, unspecified: Secondary | ICD-10-CM | POA: Diagnosis not present

## 2021-03-02 DIAGNOSIS — F411 Generalized anxiety disorder: Secondary | ICD-10-CM | POA: Diagnosis not present

## 2021-03-02 DIAGNOSIS — M170 Bilateral primary osteoarthritis of knee: Secondary | ICD-10-CM | POA: Diagnosis not present

## 2021-03-02 DIAGNOSIS — Z85828 Personal history of other malignant neoplasm of skin: Secondary | ICD-10-CM | POA: Diagnosis not present

## 2021-03-10 DIAGNOSIS — G4700 Insomnia, unspecified: Secondary | ICD-10-CM | POA: Diagnosis not present

## 2021-03-10 DIAGNOSIS — F411 Generalized anxiety disorder: Secondary | ICD-10-CM | POA: Diagnosis not present

## 2021-03-10 DIAGNOSIS — M170 Bilateral primary osteoarthritis of knee: Secondary | ICD-10-CM | POA: Diagnosis not present

## 2021-03-10 DIAGNOSIS — I10 Essential (primary) hypertension: Secondary | ICD-10-CM | POA: Diagnosis not present

## 2021-03-10 DIAGNOSIS — Z85828 Personal history of other malignant neoplasm of skin: Secondary | ICD-10-CM | POA: Diagnosis not present

## 2021-03-10 DIAGNOSIS — F32A Depression, unspecified: Secondary | ICD-10-CM | POA: Diagnosis not present

## 2021-03-16 DIAGNOSIS — M170 Bilateral primary osteoarthritis of knee: Secondary | ICD-10-CM | POA: Diagnosis not present

## 2021-03-16 DIAGNOSIS — L309 Dermatitis, unspecified: Secondary | ICD-10-CM | POA: Diagnosis not present

## 2021-03-19 DIAGNOSIS — M170 Bilateral primary osteoarthritis of knee: Secondary | ICD-10-CM | POA: Diagnosis not present

## 2021-03-19 DIAGNOSIS — I10 Essential (primary) hypertension: Secondary | ICD-10-CM | POA: Diagnosis not present

## 2021-03-19 DIAGNOSIS — F32A Depression, unspecified: Secondary | ICD-10-CM | POA: Diagnosis not present

## 2021-03-19 DIAGNOSIS — G4700 Insomnia, unspecified: Secondary | ICD-10-CM | POA: Diagnosis not present

## 2021-03-19 DIAGNOSIS — F411 Generalized anxiety disorder: Secondary | ICD-10-CM | POA: Diagnosis not present

## 2021-03-19 DIAGNOSIS — Z85828 Personal history of other malignant neoplasm of skin: Secondary | ICD-10-CM | POA: Diagnosis not present

## 2021-04-07 DIAGNOSIS — M170 Bilateral primary osteoarthritis of knee: Secondary | ICD-10-CM | POA: Diagnosis not present

## 2021-04-07 DIAGNOSIS — F331 Major depressive disorder, recurrent, moderate: Secondary | ICD-10-CM | POA: Diagnosis not present

## 2021-04-07 DIAGNOSIS — F411 Generalized anxiety disorder: Secondary | ICD-10-CM | POA: Diagnosis not present

## 2021-04-07 DIAGNOSIS — I10 Essential (primary) hypertension: Secondary | ICD-10-CM | POA: Diagnosis not present

## 2021-04-07 DIAGNOSIS — G4700 Insomnia, unspecified: Secondary | ICD-10-CM | POA: Diagnosis not present

## 2021-06-15 DIAGNOSIS — M1712 Unilateral primary osteoarthritis, left knee: Secondary | ICD-10-CM | POA: Diagnosis not present

## 2021-06-15 DIAGNOSIS — G4700 Insomnia, unspecified: Secondary | ICD-10-CM | POA: Diagnosis not present

## 2021-06-15 DIAGNOSIS — I10 Essential (primary) hypertension: Secondary | ICD-10-CM | POA: Diagnosis not present

## 2021-08-26 DIAGNOSIS — U071 COVID-19: Secondary | ICD-10-CM | POA: Diagnosis not present

## 2021-09-21 DIAGNOSIS — G4700 Insomnia, unspecified: Secondary | ICD-10-CM | POA: Diagnosis not present

## 2021-09-21 DIAGNOSIS — Z23 Encounter for immunization: Secondary | ICD-10-CM | POA: Diagnosis not present

## 2021-09-21 DIAGNOSIS — U071 COVID-19: Secondary | ICD-10-CM | POA: Diagnosis not present

## 2021-09-21 DIAGNOSIS — M1712 Unilateral primary osteoarthritis, left knee: Secondary | ICD-10-CM | POA: Diagnosis not present

## 2021-09-21 DIAGNOSIS — I10 Essential (primary) hypertension: Secondary | ICD-10-CM | POA: Diagnosis not present

## 2021-10-08 DIAGNOSIS — M810 Age-related osteoporosis without current pathological fracture: Secondary | ICD-10-CM | POA: Diagnosis not present

## 2021-10-08 DIAGNOSIS — Z1331 Encounter for screening for depression: Secondary | ICD-10-CM | POA: Diagnosis not present

## 2021-10-08 DIAGNOSIS — Z1339 Encounter for screening examination for other mental health and behavioral disorders: Secondary | ICD-10-CM | POA: Diagnosis not present

## 2021-10-08 DIAGNOSIS — Z0000 Encounter for general adult medical examination without abnormal findings: Secondary | ICD-10-CM | POA: Diagnosis not present

## 2021-10-08 DIAGNOSIS — E559 Vitamin D deficiency, unspecified: Secondary | ICD-10-CM | POA: Diagnosis not present

## 2021-10-08 DIAGNOSIS — R5383 Other fatigue: Secondary | ICD-10-CM | POA: Diagnosis not present

## 2021-10-08 DIAGNOSIS — Z23 Encounter for immunization: Secondary | ICD-10-CM | POA: Diagnosis not present

## 2021-10-08 DIAGNOSIS — I10 Essential (primary) hypertension: Secondary | ICD-10-CM | POA: Diagnosis not present

## 2021-10-28 DIAGNOSIS — Z23 Encounter for immunization: Secondary | ICD-10-CM | POA: Diagnosis not present

## 2021-10-28 DIAGNOSIS — I10 Essential (primary) hypertension: Secondary | ICD-10-CM | POA: Diagnosis not present

## 2021-10-28 DIAGNOSIS — R0609 Other forms of dyspnea: Secondary | ICD-10-CM | POA: Diagnosis not present

## 2021-10-28 DIAGNOSIS — E559 Vitamin D deficiency, unspecified: Secondary | ICD-10-CM | POA: Diagnosis not present

## 2021-10-28 DIAGNOSIS — U099 Post covid-19 condition, unspecified: Secondary | ICD-10-CM | POA: Diagnosis not present

## 2021-11-02 ENCOUNTER — Ambulatory Visit
Admission: RE | Admit: 2021-11-02 | Discharge: 2021-11-02 | Disposition: A | Discharge status: Home / Self Care | Payer: Medicare Other | Source: Ambulatory Visit | Attending: Family Medicine | Admitting: Family Medicine

## 2021-11-02 ENCOUNTER — Other Ambulatory Visit: Payer: Self-pay | Admitting: Family Medicine

## 2021-11-02 DIAGNOSIS — R0609 Other forms of dyspnea: Secondary | ICD-10-CM

## 2021-11-02 DIAGNOSIS — R0600 Dyspnea, unspecified: Secondary | ICD-10-CM | POA: Diagnosis not present

## 2021-11-16 DIAGNOSIS — M19049 Primary osteoarthritis, unspecified hand: Secondary | ICD-10-CM | POA: Diagnosis not present

## 2021-11-16 DIAGNOSIS — I10 Essential (primary) hypertension: Secondary | ICD-10-CM | POA: Diagnosis not present

## 2021-11-16 DIAGNOSIS — M1990 Unspecified osteoarthritis, unspecified site: Secondary | ICD-10-CM | POA: Diagnosis not present

## 2021-11-16 DIAGNOSIS — G4700 Insomnia, unspecified: Secondary | ICD-10-CM | POA: Diagnosis not present

## 2021-12-21 DIAGNOSIS — M1712 Unilateral primary osteoarthritis, left knee: Secondary | ICD-10-CM | POA: Diagnosis not present

## 2022-04-22 DIAGNOSIS — M1712 Unilateral primary osteoarthritis, left knee: Secondary | ICD-10-CM | POA: Diagnosis not present

## 2022-04-22 DIAGNOSIS — R5383 Other fatigue: Secondary | ICD-10-CM | POA: Diagnosis not present

## 2022-04-22 DIAGNOSIS — G4700 Insomnia, unspecified: Secondary | ICD-10-CM | POA: Diagnosis not present

## 2022-05-17 DIAGNOSIS — I10 Essential (primary) hypertension: Secondary | ICD-10-CM | POA: Diagnosis not present

## 2022-05-17 DIAGNOSIS — R5383 Other fatigue: Secondary | ICD-10-CM | POA: Diagnosis not present

## 2022-05-17 DIAGNOSIS — K219 Gastro-esophageal reflux disease without esophagitis: Secondary | ICD-10-CM | POA: Diagnosis not present

## 2022-05-17 DIAGNOSIS — G4700 Insomnia, unspecified: Secondary | ICD-10-CM | POA: Diagnosis not present

## 2022-05-17 DIAGNOSIS — E559 Vitamin D deficiency, unspecified: Secondary | ICD-10-CM | POA: Diagnosis not present

## 2022-06-02 DIAGNOSIS — Z23 Encounter for immunization: Secondary | ICD-10-CM | POA: Diagnosis not present

## 2022-06-02 DIAGNOSIS — K219 Gastro-esophageal reflux disease without esophagitis: Secondary | ICD-10-CM | POA: Diagnosis not present

## 2022-06-02 DIAGNOSIS — G4700 Insomnia, unspecified: Secondary | ICD-10-CM | POA: Diagnosis not present

## 2022-06-02 DIAGNOSIS — I10 Essential (primary) hypertension: Secondary | ICD-10-CM | POA: Diagnosis not present

## 2022-09-06 DIAGNOSIS — R5383 Other fatigue: Secondary | ICD-10-CM | POA: Diagnosis not present

## 2022-09-06 DIAGNOSIS — E876 Hypokalemia: Secondary | ICD-10-CM | POA: Diagnosis not present

## 2022-09-06 DIAGNOSIS — E782 Mixed hyperlipidemia: Secondary | ICD-10-CM | POA: Diagnosis not present

## 2022-09-06 DIAGNOSIS — I10 Essential (primary) hypertension: Secondary | ICD-10-CM | POA: Diagnosis not present

## 2022-09-14 DIAGNOSIS — Z23 Encounter for immunization: Secondary | ICD-10-CM | POA: Diagnosis not present

## 2022-09-14 DIAGNOSIS — E782 Mixed hyperlipidemia: Secondary | ICD-10-CM | POA: Diagnosis not present

## 2022-09-14 DIAGNOSIS — M810 Age-related osteoporosis without current pathological fracture: Secondary | ICD-10-CM | POA: Diagnosis not present

## 2022-09-14 DIAGNOSIS — I10 Essential (primary) hypertension: Secondary | ICD-10-CM | POA: Diagnosis not present

## 2022-09-14 DIAGNOSIS — K219 Gastro-esophageal reflux disease without esophagitis: Secondary | ICD-10-CM | POA: Diagnosis not present

## 2022-09-14 DIAGNOSIS — E559 Vitamin D deficiency, unspecified: Secondary | ICD-10-CM | POA: Diagnosis not present

## 2022-09-14 DIAGNOSIS — Z789 Other specified health status: Secondary | ICD-10-CM | POA: Diagnosis not present

## 2022-09-14 DIAGNOSIS — M1712 Unilateral primary osteoarthritis, left knee: Secondary | ICD-10-CM | POA: Diagnosis not present

## 2022-09-14 DIAGNOSIS — G4700 Insomnia, unspecified: Secondary | ICD-10-CM | POA: Diagnosis not present

## 2022-09-30 DIAGNOSIS — M170 Bilateral primary osteoarthritis of knee: Secondary | ICD-10-CM | POA: Diagnosis not present

## 2022-10-06 DIAGNOSIS — M1712 Unilateral primary osteoarthritis, left knee: Secondary | ICD-10-CM | POA: Diagnosis not present

## 2022-10-06 DIAGNOSIS — I10 Essential (primary) hypertension: Secondary | ICD-10-CM | POA: Diagnosis not present

## 2022-10-14 DIAGNOSIS — Z1339 Encounter for screening examination for other mental health and behavioral disorders: Secondary | ICD-10-CM | POA: Diagnosis not present

## 2022-10-14 DIAGNOSIS — M1712 Unilateral primary osteoarthritis, left knee: Secondary | ICD-10-CM | POA: Diagnosis not present

## 2022-10-14 DIAGNOSIS — Z0000 Encounter for general adult medical examination without abnormal findings: Secondary | ICD-10-CM | POA: Diagnosis not present

## 2022-10-14 DIAGNOSIS — Z1331 Encounter for screening for depression: Secondary | ICD-10-CM | POA: Diagnosis not present

## 2022-10-21 DIAGNOSIS — M1712 Unilateral primary osteoarthritis, left knee: Secondary | ICD-10-CM | POA: Diagnosis not present

## 2023-01-19 DIAGNOSIS — I10 Essential (primary) hypertension: Secondary | ICD-10-CM | POA: Diagnosis not present

## 2023-01-19 DIAGNOSIS — M1712 Unilateral primary osteoarthritis, left knee: Secondary | ICD-10-CM | POA: Diagnosis not present

## 2023-01-19 DIAGNOSIS — G4700 Insomnia, unspecified: Secondary | ICD-10-CM | POA: Diagnosis not present

## 2023-01-19 DIAGNOSIS — M25811 Other specified joint disorders, right shoulder: Secondary | ICD-10-CM | POA: Diagnosis not present

## 2023-04-22 DIAGNOSIS — E559 Vitamin D deficiency, unspecified: Secondary | ICD-10-CM | POA: Diagnosis not present

## 2023-04-22 DIAGNOSIS — E785 Hyperlipidemia, unspecified: Secondary | ICD-10-CM | POA: Diagnosis not present

## 2023-04-22 DIAGNOSIS — R5383 Other fatigue: Secondary | ICD-10-CM | POA: Diagnosis not present

## 2023-04-27 DIAGNOSIS — E559 Vitamin D deficiency, unspecified: Secondary | ICD-10-CM | POA: Diagnosis not present

## 2023-04-27 DIAGNOSIS — I10 Essential (primary) hypertension: Secondary | ICD-10-CM | POA: Diagnosis not present

## 2023-04-27 DIAGNOSIS — E782 Mixed hyperlipidemia: Secondary | ICD-10-CM | POA: Diagnosis not present

## 2023-04-27 DIAGNOSIS — Z789 Other specified health status: Secondary | ICD-10-CM | POA: Diagnosis not present

## 2023-04-27 DIAGNOSIS — E785 Hyperlipidemia, unspecified: Secondary | ICD-10-CM | POA: Diagnosis not present

## 2023-04-27 DIAGNOSIS — R5383 Other fatigue: Secondary | ICD-10-CM | POA: Diagnosis not present

## 2023-04-27 DIAGNOSIS — M1712 Unilateral primary osteoarthritis, left knee: Secondary | ICD-10-CM | POA: Diagnosis not present

## 2023-05-05 DIAGNOSIS — M1712 Unilateral primary osteoarthritis, left knee: Secondary | ICD-10-CM | POA: Diagnosis not present

## 2023-05-05 DIAGNOSIS — I10 Essential (primary) hypertension: Secondary | ICD-10-CM | POA: Diagnosis not present

## 2023-05-05 DIAGNOSIS — Z789 Other specified health status: Secondary | ICD-10-CM | POA: Diagnosis not present

## 2023-05-05 DIAGNOSIS — E559 Vitamin D deficiency, unspecified: Secondary | ICD-10-CM | POA: Diagnosis not present

## 2023-05-05 DIAGNOSIS — E782 Mixed hyperlipidemia: Secondary | ICD-10-CM | POA: Diagnosis not present

## 2023-05-12 DIAGNOSIS — M1712 Unilateral primary osteoarthritis, left knee: Secondary | ICD-10-CM | POA: Diagnosis not present

## 2023-05-12 DIAGNOSIS — M25819 Other specified joint disorders, unspecified shoulder: Secondary | ICD-10-CM | POA: Diagnosis not present

## 2023-05-12 DIAGNOSIS — M19011 Primary osteoarthritis, right shoulder: Secondary | ICD-10-CM | POA: Diagnosis not present

## 2023-05-12 DIAGNOSIS — M25811 Other specified joint disorders, right shoulder: Secondary | ICD-10-CM | POA: Diagnosis not present

## 2023-06-13 DIAGNOSIS — E785 Hyperlipidemia, unspecified: Secondary | ICD-10-CM | POA: Diagnosis not present

## 2023-06-13 DIAGNOSIS — I10 Essential (primary) hypertension: Secondary | ICD-10-CM | POA: Diagnosis not present

## 2023-06-23 DIAGNOSIS — E782 Mixed hyperlipidemia: Secondary | ICD-10-CM | POA: Diagnosis not present

## 2023-06-23 DIAGNOSIS — M810 Age-related osteoporosis without current pathological fracture: Secondary | ICD-10-CM | POA: Diagnosis not present

## 2023-06-23 DIAGNOSIS — E871 Hypo-osmolality and hyponatremia: Secondary | ICD-10-CM | POA: Diagnosis not present

## 2023-06-23 DIAGNOSIS — Z789 Other specified health status: Secondary | ICD-10-CM | POA: Diagnosis not present

## 2023-06-23 DIAGNOSIS — G4700 Insomnia, unspecified: Secondary | ICD-10-CM | POA: Diagnosis not present

## 2023-06-23 DIAGNOSIS — I10 Essential (primary) hypertension: Secondary | ICD-10-CM | POA: Diagnosis not present

## 2023-06-23 DIAGNOSIS — R739 Hyperglycemia, unspecified: Secondary | ICD-10-CM | POA: Diagnosis not present

## 2023-08-15 DIAGNOSIS — E785 Hyperlipidemia, unspecified: Secondary | ICD-10-CM | POA: Diagnosis not present

## 2023-08-18 DIAGNOSIS — I10 Essential (primary) hypertension: Secondary | ICD-10-CM | POA: Diagnosis not present

## 2023-08-18 DIAGNOSIS — Z789 Other specified health status: Secondary | ICD-10-CM | POA: Diagnosis not present

## 2023-08-18 DIAGNOSIS — E782 Mixed hyperlipidemia: Secondary | ICD-10-CM | POA: Diagnosis not present

## 2023-08-18 DIAGNOSIS — R7303 Prediabetes: Secondary | ICD-10-CM | POA: Diagnosis not present

## 2023-08-18 DIAGNOSIS — Z79899 Other long term (current) drug therapy: Secondary | ICD-10-CM | POA: Diagnosis not present

## 2023-08-18 DIAGNOSIS — M1712 Unilateral primary osteoarthritis, left knee: Secondary | ICD-10-CM | POA: Diagnosis not present

## 2023-08-18 DIAGNOSIS — E1165 Type 2 diabetes mellitus with hyperglycemia: Secondary | ICD-10-CM | POA: Diagnosis not present

## 2023-10-13 DIAGNOSIS — R5383 Other fatigue: Secondary | ICD-10-CM | POA: Diagnosis not present

## 2023-10-13 DIAGNOSIS — E785 Hyperlipidemia, unspecified: Secondary | ICD-10-CM | POA: Diagnosis not present

## 2023-10-13 DIAGNOSIS — E559 Vitamin D deficiency, unspecified: Secondary | ICD-10-CM | POA: Diagnosis not present

## 2023-10-19 DIAGNOSIS — Z7185 Encounter for immunization safety counseling: Secondary | ICD-10-CM | POA: Diagnosis not present

## 2023-10-19 DIAGNOSIS — E782 Mixed hyperlipidemia: Secondary | ICD-10-CM | POA: Diagnosis not present

## 2023-10-19 DIAGNOSIS — M170 Bilateral primary osteoarthritis of knee: Secondary | ICD-10-CM | POA: Diagnosis not present

## 2023-10-19 DIAGNOSIS — Z79899 Other long term (current) drug therapy: Secondary | ICD-10-CM | POA: Diagnosis not present

## 2023-10-19 DIAGNOSIS — Z1331 Encounter for screening for depression: Secondary | ICD-10-CM | POA: Diagnosis not present

## 2023-10-19 DIAGNOSIS — Z0000 Encounter for general adult medical examination without abnormal findings: Secondary | ICD-10-CM | POA: Diagnosis not present

## 2023-10-19 DIAGNOSIS — Z1339 Encounter for screening examination for other mental health and behavioral disorders: Secondary | ICD-10-CM | POA: Diagnosis not present

## 2023-10-19 DIAGNOSIS — I10 Essential (primary) hypertension: Secondary | ICD-10-CM | POA: Diagnosis not present

## 2023-10-19 DIAGNOSIS — E559 Vitamin D deficiency, unspecified: Secondary | ICD-10-CM | POA: Diagnosis not present

## 2023-10-19 DIAGNOSIS — Z789 Other specified health status: Secondary | ICD-10-CM | POA: Diagnosis not present

## 2023-10-19 DIAGNOSIS — Z23 Encounter for immunization: Secondary | ICD-10-CM | POA: Diagnosis not present

## 2023-10-26 DIAGNOSIS — M170 Bilateral primary osteoarthritis of knee: Secondary | ICD-10-CM | POA: Diagnosis not present

## 2023-11-02 DIAGNOSIS — I10 Essential (primary) hypertension: Secondary | ICD-10-CM | POA: Diagnosis not present

## 2023-11-02 DIAGNOSIS — M170 Bilateral primary osteoarthritis of knee: Secondary | ICD-10-CM | POA: Diagnosis not present

## 2024-01-30 ENCOUNTER — Encounter (HOSPITAL_BASED_OUTPATIENT_CLINIC_OR_DEPARTMENT_OTHER): Payer: Self-pay | Admitting: Radiology

## 2024-01-30 ENCOUNTER — Emergency Department (HOSPITAL_BASED_OUTPATIENT_CLINIC_OR_DEPARTMENT_OTHER)
Admission: EM | Admit: 2024-01-30 | Discharge: 2024-01-30 | Disposition: A | Discharge status: Home / Self Care | Attending: Emergency Medicine | Admitting: Emergency Medicine

## 2024-01-30 ENCOUNTER — Other Ambulatory Visit: Payer: Self-pay

## 2024-01-30 ENCOUNTER — Emergency Department (HOSPITAL_BASED_OUTPATIENT_CLINIC_OR_DEPARTMENT_OTHER): Admitting: Radiology

## 2024-01-30 DIAGNOSIS — R739 Hyperglycemia, unspecified: Secondary | ICD-10-CM | POA: Diagnosis not present

## 2024-01-30 DIAGNOSIS — W1839XA Other fall on same level, initial encounter: Secondary | ICD-10-CM | POA: Insufficient documentation

## 2024-01-30 DIAGNOSIS — S52572A Other intraarticular fracture of lower end of left radius, initial encounter for closed fracture: Secondary | ICD-10-CM | POA: Diagnosis not present

## 2024-01-30 DIAGNOSIS — R2232 Localized swelling, mass and lump, left upper limb: Secondary | ICD-10-CM | POA: Diagnosis present

## 2024-01-30 DIAGNOSIS — Z79899 Other long term (current) drug therapy: Secondary | ICD-10-CM | POA: Insufficient documentation

## 2024-01-30 DIAGNOSIS — I10 Essential (primary) hypertension: Secondary | ICD-10-CM | POA: Insufficient documentation

## 2024-01-30 DIAGNOSIS — Z7982 Long term (current) use of aspirin: Secondary | ICD-10-CM | POA: Insufficient documentation

## 2024-01-30 DIAGNOSIS — R030 Elevated blood-pressure reading, without diagnosis of hypertension: Secondary | ICD-10-CM

## 2024-01-30 MED ORDER — ACETAMINOPHEN 500 MG PO TABS
1000.0000 mg | ORAL_TABLET | Freq: Once | ORAL | Status: AC
  Administered 2024-01-30: 1000 mg via ORAL
  Filled 2024-01-30: qty 2

## 2024-01-30 NOTE — ED Provider Notes (Signed)
 " [place] EMERGENCY DEPARTMENT AT [facility] Provider Note   CSN: [id number] Arrival date & time: 01/30/24  1543     Patient presents with: [name]   [name] is a 89 y.o. female.  Patient with past medical history of hypertension, hyperlipidemia, anxiety, depression presents emergency room after a fall.  Patient had a mechanical fall earlier today.  She fell forward catching herself with her left hand.  Since then she has had pain and swelling to her left wrist.  She denies hitting her head or use of blood thinners.  She denies any head or neck pain.  No other injuries during fall. No LOC, no seizure activity reported.     Fall       Prior to Admission medications  Medication Sig Start Date End Date Taking? Authorizing Provider  fenofibrate 160 MG tablet  11/13/23  Yes [provider]  nebivolol (BYSTOLIC) 5 MG tablet  12/23/23  Yes [provider]  olmesartan (BENICAR) 40 MG tablet  11/15/23  Yes [provider]  temazepam (RESTORIL) 15 MG capsule  12/19/23  Yes [provider]  aspirin  81 MG chewable tablet Chew 81 mg by mouth every morning.     [provider]  atenolol  (TENORMIN ) 50 MG tablet Take 1 tablet (50 mg total) by mouth 2 (two) times daily. 08/01/15   Abrol, Nayana, MD  calcitonin, salmon, (MIACALCIN MARCIANA) 200 UNIT/ACT nasal spray Place 1 spray into alternate nostrils daily.  07/21/15   [provider]  carboxymethylcellulose (REFRESH TEARS) 0.5 % SOLN Apply 1 drop to eye daily as needed (dry eyes).     [provider]  cefUROXime  (CEFTIN ) 250 MG tablet Take 1 tablet (250 mg total) by mouth 2 (two) times daily with a meal. 08/01/15   Abrol, Nayana, MD  cyclobenzaprine  (FLEXERIL ) 5 MG tablet Take 1 tablet (5 mg total) by mouth 3 (three) times daily. 08/01/15   Abrol, Nayana, MD  DULoxetine  (CYMBALTA ) 60 MG capsule Take 60 mg by mouth daily.  06/12/15   [provider]  famotidine  (PEPCID ) 20  MG tablet Take 1 tablet (20 mg total) by mouth daily. 08/01/15   Abrol, Nayana, MD  lidocaine  (LIDODERM ) 5 % Place 1 patch onto the skin daily. Remove & Discard patch within 12 hours or as directed by MD 08/01/15   Abrol, Nayana, MD  losartan  (COZAAR ) 50 MG tablet Take 100 mg by mouth daily.  07/15/15   [provider]  potassium chloride  SA (K-DUR,KLOR-CON ) 20 MEQ tablet Take 2 tablets (40 mEq total) by mouth daily. 08/01/15   Abrol, Nayana, MD  Probiotic Product (PROBIOTIC PO) Take 1 capsule by mouth daily.    [provider]  traMADol  (ULTRAM ) 50 MG tablet Take 1 tablet (50 mg total) by mouth every 6 (six) hours as needed for moderate pain. 08/01/15   Abrol, Nayana, MD  traZODone  (DESYREL ) 50 MG tablet Take 1 tablet (50 mg total) by mouth 2 (two) times daily. Patient taking differently: Take 75 mg by mouth at bedtime.  12/13/12   Krystal Reyes LABOR, MD  VOLTAREN 1 % GEL Apply 4 g topically 4 (four) times daily.  06/12/15   [provider]    Allergies: Ace inhibitors and Lactose intolerance (gi)    Review of Systems  Musculoskeletal:  Positive for arthralgias.    Updated Vital Signs BP (!) 204/111 (BP Location: Right Arm)   Pulse 77   Temp 99.6 F (37.6 C) (Oral)   Resp 18  Ht 5' 3 (1.6 m)   Wt 63.5 kg   SpO2 96%   BMI 24.80 kg/m   Physical Exam Vitals and nursing note reviewed.  Constitutional:      General: She is not in acute distress.    Appearance: She is not toxic-appearing.  HENT:     Head: Normocephalic and atraumatic.  Eyes:     General: No scleral icterus.    Conjunctiva/sclera: Conjunctivae normal.  Cardiovascular:     Rate and Rhythm: Normal rate and regular rhythm.     Pulses: Normal pulses.     Heart sounds: Normal heart sounds.  Pulmonary:     Effort: Pulmonary effort is normal. No respiratory distress.     Breath sounds: Normal breath sounds.  Abdominal:     General: Abdomen is flat. Bowel sounds are normal.     Palpations: Abdomen  is soft.     Tenderness: There is no abdominal tenderness.  Skin:    General: Skin is warm and dry.     Findings: No lesion.  Neurological:     General: No focal deficit present.     Mental Status: She is alert and oriented to person, place, and time. Mental status is at baseline.     (all labs ordered are listed, but only abnormal results are displayed) Labs Reviewed - No data to display  EKG: None  Radiology: DG Wrist Complete Left Result Date: 01/30/2024 EXAM: 3 OR MORE VIEW(S) XRAY OF THE LEFT WRIST 01/30/2024 05:14:25 PM COMPARISON: None available. CLINICAL HISTORY: fall FINDINGS: BONES AND JOINTS: There is an impacted intraarticular distal left radial fracture. Question surrounding sclerosis and early callus suggesting this could be an old or subacute fracture. No subluxation or dislocation. Advanced osteoarthritis at the 1st carpometacarpal joint and within the carpal region. SOFT TISSUES: Unremarkable. IMPRESSION: 1. Impacted intra-articular distal left radial fracture, age indeterminate but possibly chronic or subacute with possible early callus formation. Electronically signed by: Franky Crease MD 01/30/2024 05:31 PM EST RP Workstation: [id number]     Procedures   Medications Ordered in the ED - No data to display                                  Medical Decision Making Amount and/or Complexity of Data Reviewed Radiology: ordered.   This patient presents to the ED for concern of left wrist pain, this involves an extensive number of treatment options, and is a complaint that carries with it a high risk of complications and morbidity.  The differential diagnosis includes fracture, sprain, compartment syndrome, DVT    Imaging Studies ordered:  I ordered imaging studies including left wrist x-ray  I independently visualized and interpreted imaging which showed intra-articular radial fracture questions subacute, but clinically seems more likely to be acute, no prior  history of wrist fracture.  I agree with the radiologist interpretation   Cardiac Monitoring: / EKG:  The patient was maintained on a cardiac monitor.     Problem List / ED Course / Critical interventions / Medication management  Patient presents after mechanical fall injuring her left wrist.  On arrival hemodynamically stable and well-appearing.  She complains of mild left wrist pain.  She is able to initiate range of motion but she does have significant dorsal swelling and bruising.  Patient's compartments are soft and she has strong radial pulse.  No pain over shoulder or fingers.  Patient's x-ray shows left-sided  radial intra-articular fracture, appears to have good alignment and no requiring reduction. Discussed diagnosis and treatment with patient and family member at home. At this time patients pain is well controlled.  I ordered medication including Tylenol .  Reevaluation of the patient after these medicines showed that the patient improved I have reviewed the patients home medicines and have made adjustments as needed. Patient was placed in a left arm cast.  Recommend pain control.  Will follow-up with hand surgery.  Given return precautions.        Final diagnoses:  Other closed intra-articular fracture of distal end of left radius, initial encounter  Elevated blood pressure reading    ED Discharge Orders     None          Shermon Warren SAILOR, PA-C 01/30/24 1842  "

## 2024-01-30 NOTE — ED Notes (Addendum)
 Pt d/c instructions, medications, and follow-up care reviewed with pt and family. Pt and family verbalized understanding and had no further questions at time of d/c. Pt CA&Ox4 and in NAD at time of d/c

## 2024-01-30 NOTE — Discharge Instructions (Signed)
 Please call the hand doctor to schedule follow-up.  In the meantime I would recommend taking 1000 mg of Tylenol  every 8 hours.  You can also take 600 mg of ibuprofen with food every 8 hours.  You can use ice over the area of pain.  Please return to emergency room with new or worsening symptoms.

## 2024-01-30 NOTE — ED Triage Notes (Addendum)
 Pt fell catching herself with her left hand. Her wrist is now swollen. She denies pain anywhere else. Denies hitting her head. Denies use of blood thinners. She endorses that she over reach and fell.
